# Patient Record
Sex: Male | Born: 1935 | Race: White | Hispanic: No | Marital: Married | Smoking: Former smoker
Health system: Southern US, Community
[De-identification: ages and names within clinical notes are randomized; demographics above are authoritative.]

## PROBLEM LIST (undated history)

## (undated) DIAGNOSIS — Z950 Presence of cardiac pacemaker: Secondary | ICD-10-CM

## (undated) DIAGNOSIS — Z8719 Personal history of other diseases of the digestive system: Secondary | ICD-10-CM

## (undated) DIAGNOSIS — D6481 Anemia due to antineoplastic chemotherapy: Secondary | ICD-10-CM

## (undated) DIAGNOSIS — T7840XA Allergy, unspecified, initial encounter: Secondary | ICD-10-CM

## (undated) DIAGNOSIS — I209 Angina pectoris, unspecified: Secondary | ICD-10-CM

## (undated) DIAGNOSIS — R0602 Shortness of breath: Secondary | ICD-10-CM

## (undated) DIAGNOSIS — I119 Hypertensive heart disease without heart failure: Secondary | ICD-10-CM

## (undated) DIAGNOSIS — T451X5A Adverse effect of antineoplastic and immunosuppressive drugs, initial encounter: Secondary | ICD-10-CM

## (undated) DIAGNOSIS — Z923 Personal history of irradiation: Secondary | ICD-10-CM

## (undated) DIAGNOSIS — I4891 Unspecified atrial fibrillation: Principal | ICD-10-CM

## (undated) DIAGNOSIS — M069 Rheumatoid arthritis, unspecified: Secondary | ICD-10-CM

## (undated) DIAGNOSIS — Z95810 Presence of automatic (implantable) cardiac defibrillator: Secondary | ICD-10-CM

## (undated) DIAGNOSIS — R5081 Fever presenting with conditions classified elsewhere: Secondary | ICD-10-CM

## (undated) DIAGNOSIS — C4490 Unspecified malignant neoplasm of skin, unspecified: Secondary | ICD-10-CM

## (undated) DIAGNOSIS — I219 Acute myocardial infarction, unspecified: Secondary | ICD-10-CM

## (undated) DIAGNOSIS — I255 Ischemic cardiomyopathy: Secondary | ICD-10-CM

## (undated) DIAGNOSIS — I6529 Occlusion and stenosis of unspecified carotid artery: Secondary | ICD-10-CM

## (undated) DIAGNOSIS — I2510 Atherosclerotic heart disease of native coronary artery without angina pectoris: Secondary | ICD-10-CM

## (undated) DIAGNOSIS — M1990 Unspecified osteoarthritis, unspecified site: Secondary | ICD-10-CM

## (undated) DIAGNOSIS — I509 Heart failure, unspecified: Secondary | ICD-10-CM

## (undated) DIAGNOSIS — E1159 Type 2 diabetes mellitus with other circulatory complications: Secondary | ICD-10-CM

## (undated) DIAGNOSIS — D709 Neutropenia, unspecified: Secondary | ICD-10-CM

## (undated) DIAGNOSIS — E785 Hyperlipidemia, unspecified: Secondary | ICD-10-CM

## (undated) DIAGNOSIS — C3490 Malignant neoplasm of unspecified part of unspecified bronchus or lung: Secondary | ICD-10-CM

## (undated) HISTORY — DX: Occlusion and stenosis of unspecified carotid artery: I65.29

## (undated) HISTORY — DX: Rheumatoid arthritis, unspecified: M06.9

## (undated) HISTORY — DX: Atherosclerotic heart disease of native coronary artery without angina pectoris: I25.10

## (undated) HISTORY — DX: Adverse effect of antineoplastic and immunosuppressive drugs, initial encounter: T45.1X5A

## (undated) HISTORY — PX: CORONARY ANGIOPLASTY WITH STENT PLACEMENT: SHX49

## (undated) HISTORY — DX: Heart failure, unspecified: I50.9

## (undated) HISTORY — PX: PR VEIN BYPASS GRAFT,AORTO-FEM-POP: 35551

## (undated) HISTORY — PX: OTHER SURGICAL HISTORY: SHX169

## (undated) HISTORY — DX: Anemia due to antineoplastic chemotherapy: D64.81

## (undated) HISTORY — DX: Presence of cardiac pacemaker: Z95.0

## (undated) SURGERY — BRONCHOSCOPY, WITH FLUOROSCOPY
Anesthesia: Moderate Sedation

## (undated) SURGERY — Surgical Case
Anesthesia: *Unknown

---

## 1968-10-03 HISTORY — PX: INGUINAL HERNIA REPAIR: SUR1180

## 1968-10-03 HISTORY — PX: HEMORRHOID SURGERY: SHX153

## 1991-08-03 HISTORY — PX: CORONARY ARTERY BYPASS GRAFT: SHX141

## 1998-06-05 ENCOUNTER — Inpatient Hospital Stay (HOSPITAL_COMMUNITY): Admission: AD | Admit: 1998-06-05 | Discharge: 1998-06-08 | Payer: Self-pay | Admitting: Cardiology

## 1998-06-25 ENCOUNTER — Encounter (HOSPITAL_COMMUNITY): Admission: RE | Admit: 1998-06-25 | Discharge: 1998-09-23 | Payer: Self-pay | Admitting: Cardiology

## 1998-09-24 ENCOUNTER — Encounter (HOSPITAL_COMMUNITY): Admission: RE | Admit: 1998-09-24 | Discharge: 1998-12-23 | Payer: Self-pay | Admitting: Cardiology

## 1999-07-02 ENCOUNTER — Encounter: Payer: Self-pay | Admitting: *Deleted

## 1999-07-02 ENCOUNTER — Encounter: Admission: RE | Admit: 1999-07-02 | Discharge: 1999-07-02 | Payer: Self-pay | Admitting: *Deleted

## 2000-09-28 ENCOUNTER — Ambulatory Visit (HOSPITAL_COMMUNITY): Admission: RE | Admit: 2000-09-28 | Discharge: 2000-09-28 | Payer: Self-pay | Admitting: Gastroenterology

## 2001-06-01 ENCOUNTER — Encounter: Payer: Self-pay | Admitting: Internal Medicine

## 2001-06-01 ENCOUNTER — Encounter: Admission: RE | Admit: 2001-06-01 | Discharge: 2001-06-01 | Payer: Self-pay | Admitting: Internal Medicine

## 2001-06-20 ENCOUNTER — Ambulatory Visit (HOSPITAL_COMMUNITY): Admission: RE | Admit: 2001-06-20 | Discharge: 2001-06-20 | Payer: Self-pay | Admitting: Cardiology

## 2002-05-03 ENCOUNTER — Encounter: Payer: Self-pay | Admitting: Internal Medicine

## 2002-05-03 ENCOUNTER — Encounter: Admission: RE | Admit: 2002-05-03 | Discharge: 2002-05-03 | Payer: Self-pay | Admitting: Internal Medicine

## 2002-12-06 ENCOUNTER — Encounter (INDEPENDENT_AMBULATORY_CARE_PROVIDER_SITE_OTHER): Payer: Self-pay | Admitting: Cardiology

## 2002-12-06 ENCOUNTER — Ambulatory Visit: Admission: RE | Admit: 2002-12-06 | Discharge: 2002-12-06 | Payer: Self-pay | Admitting: Internal Medicine

## 2003-01-19 ENCOUNTER — Encounter: Admission: RE | Admit: 2003-01-19 | Discharge: 2003-01-19 | Payer: Self-pay | Admitting: Internal Medicine

## 2003-11-08 ENCOUNTER — Encounter: Admission: RE | Admit: 2003-11-08 | Discharge: 2003-11-08 | Payer: Self-pay | Admitting: Internal Medicine

## 2004-04-01 ENCOUNTER — Ambulatory Visit (HOSPITAL_COMMUNITY): Admission: RE | Admit: 2004-04-01 | Discharge: 2004-04-01 | Payer: Self-pay | Admitting: Gastroenterology

## 2004-11-07 ENCOUNTER — Encounter: Admission: RE | Admit: 2004-11-07 | Discharge: 2004-11-07 | Payer: Self-pay | Admitting: Cardiology

## 2004-11-10 ENCOUNTER — Inpatient Hospital Stay (HOSPITAL_COMMUNITY): Admission: RE | Admit: 2004-11-10 | Discharge: 2004-11-12 | Payer: Self-pay | Admitting: Cardiology

## 2004-11-11 ENCOUNTER — Ambulatory Visit: Payer: Self-pay | Admitting: Internal Medicine

## 2004-11-27 ENCOUNTER — Encounter (HOSPITAL_COMMUNITY): Admission: RE | Admit: 2004-11-27 | Discharge: 2005-02-25 | Payer: Self-pay | Admitting: Cardiology

## 2005-02-26 ENCOUNTER — Encounter (HOSPITAL_COMMUNITY): Admission: RE | Admit: 2005-02-26 | Discharge: 2005-05-27 | Payer: Self-pay | Admitting: Cardiology

## 2005-09-15 ENCOUNTER — Encounter: Admission: RE | Admit: 2005-09-15 | Discharge: 2005-09-15 | Payer: Self-pay | Admitting: Internal Medicine

## 2006-05-04 HISTORY — PX: INSERT / REPLACE / REMOVE PACEMAKER: SUR710

## 2006-05-04 HISTORY — PX: CARDIAC DEFIBRILLATOR PLACEMENT: SHX171

## 2006-05-06 ENCOUNTER — Ambulatory Visit: Payer: Self-pay | Admitting: Internal Medicine

## 2006-05-06 LAB — CONVERTED CEMR LAB
BUN: 79 mg/dL — ABNORMAL HIGH (ref 6–23)
Basophils Absolute: 0.1 10*3/uL (ref 0.0–0.1)
Basophils Relative: 0.9 % (ref 0.0–1.0)
CO2: 28 meq/L (ref 19–32)
Calcium: 9.4 mg/dL (ref 8.4–10.5)
Chloride: 104 meq/L (ref 96–112)
Creatinine, Ser: 2.8 mg/dL — ABNORMAL HIGH (ref 0.4–1.5)
Eosinophils Absolute: 0.4 10*3/uL (ref 0.0–0.6)
Eosinophils Relative: 4.9 % (ref 0.0–5.0)
GFR calc Af Amer: 29 mL/min
GFR calc non Af Amer: 24 mL/min
Glucose, Bld: 188 mg/dL — ABNORMAL HIGH (ref 70–99)
HCT: 38.1 % — ABNORMAL LOW (ref 39.0–52.0)
Hemoglobin: 13.2 g/dL (ref 13.0–17.0)
INR: 1.2 (ref 0.9–2.0)
Lymphocytes Relative: 36.5 % (ref 12.0–46.0)
MCHC: 34.7 g/dL (ref 30.0–36.0)
MCV: 92 fL (ref 78.0–100.0)
Monocytes Absolute: 0.8 10*3/uL — ABNORMAL HIGH (ref 0.2–0.7)
Monocytes Relative: 10.6 % (ref 3.0–11.0)
Neutro Abs: 3.7 10*3/uL (ref 1.4–7.7)
Neutrophils Relative %: 47.1 % (ref 43.0–77.0)
Platelets: 246 10*3/uL (ref 150–400)
Potassium: 5.2 meq/L — ABNORMAL HIGH (ref 3.5–5.1)
Prothrombin Time: 13.2 s (ref 10.0–14.0)
RBC: 4.14 M/uL — ABNORMAL LOW (ref 4.22–5.81)
RDW: 13.2 % (ref 11.5–14.6)
Sodium: 141 meq/L (ref 135–145)
WBC: 7.9 10*3/uL (ref 4.5–10.5)
aPTT: 28.5 s (ref 26.5–36.5)

## 2006-05-10 ENCOUNTER — Ambulatory Visit: Payer: Self-pay | Admitting: Internal Medicine

## 2006-05-10 LAB — CONVERTED CEMR LAB
BUN: 78 mg/dL — ABNORMAL HIGH (ref 6–23)
CO2: 27 meq/L (ref 19–32)
Calcium: 9.4 mg/dL (ref 8.4–10.5)
Chloride: 102 meq/L (ref 96–112)
Creatinine, Ser: 2.6 mg/dL — ABNORMAL HIGH (ref 0.4–1.5)
GFR calc Af Amer: 32 mL/min
GFR calc non Af Amer: 26 mL/min
Glucose, Bld: 124 mg/dL — ABNORMAL HIGH (ref 70–99)
Potassium: 4.9 meq/L (ref 3.5–5.1)
Sodium: 140 meq/L (ref 135–145)

## 2006-05-14 ENCOUNTER — Ambulatory Visit: Payer: Self-pay | Admitting: Internal Medicine

## 2006-05-14 ENCOUNTER — Inpatient Hospital Stay (HOSPITAL_COMMUNITY): Admission: RE | Admit: 2006-05-14 | Discharge: 2006-05-15 | Payer: Self-pay | Admitting: Internal Medicine

## 2006-05-26 ENCOUNTER — Ambulatory Visit: Payer: Self-pay

## 2006-09-15 ENCOUNTER — Ambulatory Visit: Payer: Self-pay | Admitting: Internal Medicine

## 2006-10-25 ENCOUNTER — Ambulatory Visit (HOSPITAL_COMMUNITY): Admission: RE | Admit: 2006-10-25 | Discharge: 2006-10-25 | Payer: Self-pay | Admitting: Cardiology

## 2007-02-03 HISTORY — PX: CAROTID ENDARTERECTOMY: SUR193

## 2007-03-01 ENCOUNTER — Ambulatory Visit: Payer: Self-pay | Admitting: Vascular Surgery

## 2007-03-04 ENCOUNTER — Inpatient Hospital Stay (HOSPITAL_COMMUNITY): Admission: RE | Admit: 2007-03-04 | Discharge: 2007-03-05 | Payer: Self-pay | Admitting: Vascular Surgery

## 2007-03-04 ENCOUNTER — Encounter: Payer: Self-pay | Admitting: Vascular Surgery

## 2007-03-04 ENCOUNTER — Ambulatory Visit: Payer: Self-pay | Admitting: Vascular Surgery

## 2007-03-15 ENCOUNTER — Ambulatory Visit: Payer: Self-pay | Admitting: Vascular Surgery

## 2007-08-02 ENCOUNTER — Encounter: Admission: RE | Admit: 2007-08-02 | Discharge: 2007-08-02 | Payer: Self-pay | Admitting: Internal Medicine

## 2007-09-13 ENCOUNTER — Ambulatory Visit: Payer: Self-pay | Admitting: Vascular Surgery

## 2008-03-20 ENCOUNTER — Ambulatory Visit: Payer: Self-pay | Admitting: Vascular Surgery

## 2008-11-18 ENCOUNTER — Encounter: Payer: Self-pay | Admitting: Internal Medicine

## 2008-12-11 DIAGNOSIS — E1159 Type 2 diabetes mellitus with other circulatory complications: Secondary | ICD-10-CM

## 2008-12-11 DIAGNOSIS — I255 Ischemic cardiomyopathy: Secondary | ICD-10-CM

## 2008-12-11 HISTORY — DX: Type 2 diabetes mellitus with other circulatory complications: E11.59

## 2008-12-13 ENCOUNTER — Ambulatory Visit: Payer: Self-pay | Admitting: Internal Medicine

## 2008-12-13 DIAGNOSIS — I4891 Unspecified atrial fibrillation: Secondary | ICD-10-CM

## 2008-12-13 DIAGNOSIS — Z95810 Presence of automatic (implantable) cardiac defibrillator: Secondary | ICD-10-CM

## 2009-03-05 ENCOUNTER — Ambulatory Visit: Payer: Self-pay | Admitting: Vascular Surgery

## 2009-09-30 ENCOUNTER — Ambulatory Visit: Payer: Self-pay | Admitting: Internal Medicine

## 2009-10-01 ENCOUNTER — Telehealth: Payer: Self-pay | Admitting: Internal Medicine

## 2009-10-01 ENCOUNTER — Encounter: Payer: Self-pay | Admitting: Internal Medicine

## 2009-11-11 ENCOUNTER — Ambulatory Visit: Payer: Self-pay | Admitting: Internal Medicine

## 2010-03-04 NOTE — Assessment & Plan Note (Signed)
Summary: f74m/dfg   Visit Type:  f51m Primary Provider:  Dr. Donnie Aho  CC:  sharp pains in his chest.  History of Present Illness: Karl [name] returns  today for followup.  He was seen in our office several weeks ago for evaluation of atrial fibrillation with a RVR resulting in an ICD shock.  He had his device reprogrammed and has been anxious and reports that he has curtailed many of his usual activities.  He also notes that he has had fleeting episodes of chest pain.  Non-exertional lasting only a secondd or two.  No other sequelae.  he notes that when he lays down at night he feels like his heart is beating hard.  No other complaints.  He denies sob or peripheral edema.  Problems Prior to Update: 1)  Atrial Fibrillation  (ICD-427.31) 2)  Automatic Implantable Cardiac Defibrillator Situ  (ICD-V45.02) 3)  Cardiomyopathy, Ischemic  (ICD-414.8) 4)  Hyperlipidemia  (ICD-272.4) 5)  Hypertension  (ICD-401.9) 6)  Diabetes Mellitus, Type II  (ICD-250.00) 7)  Chronic Systolic Heart Failure  (ICD-428.22) 8)  Coronary Artery Disease  (ICD-414.00) 9)  Hypotension  (ICD-458.9) 10)  Carotid Endarterectomy, Left, Hx of  (ICD-V15.1) 11)  Carotid Artery Stenosis, Left  (ICD-433.10)  Current Medications (verified): 1)  Aspirin 81 Mg Tbec (Aspirin) .... Take One Tablet By Mouth Daily 2)  Carvedilol 3.125 Mg Tabs (Carvedilol) .... Take One Tablet By Mouth Twice A Day 3)  Diovan 160 Mg Tabs (Valsartan) .... Take One Tablet By Mouth Daily 4)  Furosemide 40 Mg Tabs (Furosemide) .... Take One Tablet By Mouth Daily. 5)  Pradaxa 150 Mg Caps (Dabigatran Etexilate Mesylate) .... Take 1 Capsule Twice A Day 6)  Gemfibrozil 600 Mg Tabs (Gemfibrozil) .... Take One Tablet By Mouth Twice A Day With Meals 7)  Fish Oil   Oil (Fish Oil) .... 1000mg  Two Times A Day 8)  Fosinopril Sodium 20 Mg Tabs (Fosinopril Sodium) .... Once Daily 9)  Glimepiride 1 Mg Tabs (Glimepiride) .... 1/2 Tablet Once Daily 10)  Digoxin 0.125 Mg  Tabs (Digoxin) .... Take One Tablet By Mouth Daily 11)  Simvastatin 40 Mg Tabs (Simvastatin) .... Take One Tablet By Mouth Daily At Bedtime 12)  Tramadol Hcl 50 Mg Tabs (Tramadol Hcl) .... Once Daily  Allergies (verified): 1)  ! Avandamet (Rosiglitazone-Metformin)  Past History:  Past Medical History: Last updated: 12/11/2008 Diabetes Type 2 Hypertension Hyperlipidemia.History of pacemaker and implantable defibrillator in April of 2008 History of ischemic cardiomyopathy  History of Class II to III chronic systolic congestive heart failure.  Coronary artery disease status post coronary artery bypass grafting    by Dr. Andrey Campanile in 1993 and percutaneous transluminal coronary    angioplasty and stenting by Dr. Donnie Aho in 2002 and in 2006.   Severe left internal carotid artery stenosis, asymptomatic.   Status post left carotid endarterectomy.   Past Surgical History: Last updated: 12/11/2008 .History of pacemaker and implantable defibrillator in April of 2008  St.Jude#[id number] March 04, 2007, left carotid endarterectomy with Dacron   patch angioplasty by Dr. Josephina Gip.   Review of Systems       The patient complains of chest pain.  The patient denies syncope, dyspnea on exertion, and peripheral edema.    Vital Signs:  Patient profile:   75 year old male Height:      71 inches Weight:      208.50 pounds BMI:     29.18 Pulse rate:   62 / minute BP sitting:  128 / 62  (left arm) Cuff size:   large  Vitals Entered By: Caralee Ates CMA (November 11, 2009 9:07 AM)  Physical Exam  General:  Well developed, well nourished, in no acute distress. Head:  normocephalic and atraumatic Eyes:  PERRLA/EOM intact; conjunctiva and lids normal. Mouth:  Teeth, gums and palate normal. Oral mucosa normal. Neck:  Neck supple, no JVD. No masses, thyromegaly or abnormal cervical nodes. Chest Wall:  Well healed ICD incision. Lungs:  Clear bilaterally to auscultation.  No wheezes, rales, or  rhonchi or increased work of breathing. Heart:  RRR with normal S1 and S2.  PMI is enlarged and laterally displaced.  No murmurs. Abdomen:  Bowel sounds positive; abdomen soft and non-tender without masses, organomegaly, or hernias noted. No hepatosplenomegaly. Msk:  Back normal, normal gait. Muscle strength and tone normal. Pulses:  pulses normal in all 4 extremities Extremities:  No clubbing or cyanosis. Neurologic:  Alert and oriented x 3.    ICD Specifications Following MD:  Karl Bunting, MD     Referring MD:  Link Snuffer ICD Vendor:  [facility]     ICD Model Number:  [id number]     ICD Serial Number:  [id number] ICD DOI:  05/14/2006     ICD Implanting MD:  Karl Bunting, MD  Lead 1:    Location: RV     DOI: 05/14/2006     Model #: [id number]     Serial #: [id number]     Status: active  ICD Follow Up Remote Check?  No Battery Voltage:  3.10 V     Charge Time:  10.4 seconds     Battery Est. Longevity:  5.6 YEARS Underlying rhythm:  SR ICD Dependent:  No       ICD Device Measurements Right Ventricle:  Amplitude: 11.7 mV, Impedance: 440 ohms,  Shock Impedance: 39 ohms   Episodes Shock:  0     ATP:  0     Nonsustained:  0     Ventricular Pacing:  <1%  Brady Parameters Mode VVI     Lower Rate Limit:  40      Tachy Zones VF:  206     VT:  166(monitor)     Tech Comments:  Interrogation only today.  Merlin transmissions with Dr. Donnie Aho. Altha Harm, LPN  November 11, 2009 9:37 AM   Impression & Recommendations:  Problem # 1:  AUTOMATIC IMPLANTABLE CARDIAC DEFIBRILLATOR SITU (ICD-V45.02) His device is rechecked today to see if he has had any ventricular high rates.  He has not.  His device will be rechecked in January.  Problem # 2:  ATRIAL FIBRILLATION (ICD-427.31) No evidence of recurrent atrial fibrillation. He will continue Pradaxa. His updated medication list for this problem includes:    Aspirin 81 Mg Tbec (Aspirin) .Marland Kitchen... Take one tablet by mouth daily    Carvedilol 3.125 Mg Tabs  (Carvedilol) .Marland Kitchen... Take one tablet by mouth twice a day    Digoxin 0.125 Mg Tabs (Digoxin) .Marland Kitchen... Take one tablet by mouth daily  Problem # 3:  CHRONIC SYSTOLIC HEART FAILURE (ICD-428.22) He is still class 2.  He will continue his current meds and maintain a low sodium diet. His updated medication list for this problem includes:    Aspirin 81 Mg Tbec (Aspirin) .Marland Kitchen... Take one tablet by mouth daily    Carvedilol 3.125 Mg Tabs (Carvedilol) .Marland Kitchen... Take one tablet by mouth twice a day    Diovan 160 Mg Tabs (Valsartan) .Marland Kitchen... Take one  tablet by mouth daily    Furosemide 40 Mg Tabs (Furosemide) .Marland Kitchen... Take one tablet by mouth daily.    Fosinopril Sodium 20 Mg Tabs (Fosinopril sodium) ..... Once daily    Digoxin 0.125 Mg Tabs (Digoxin) .Marland Kitchen... Take one tablet by mouth daily  His updated medication list for this problem includes:    Aspirin 81 Mg Tbec (Aspirin) .Marland Kitchen... Take one tablet by mouth daily    Carvedilol 3.125 Mg Tabs (Carvedilol) .Marland Kitchen... Take one tablet by mouth twice a day    Diovan 160 Mg Tabs (Valsartan) .Marland Kitchen... Take one tablet by mouth daily    Furosemide 40 Mg Tabs (Furosemide) .Marland Kitchen... Take one tablet by mouth daily.    Fosinopril Sodium 20 Mg Tabs (Fosinopril sodium) ..... Once daily    Digoxin 0.125 Mg Tabs (Digoxin) .Marland Kitchen... Take one tablet by mouth daily  His updated medication list for this problem includes:    Aspirin 81 Mg Tbec (Aspirin) .Marland Kitchen... Take one tablet by mouth daily    Carvedilol 3.125 Mg Tabs (Carvedilol) .Marland Kitchen... Take one tablet by mouth twice a day    Diovan 160 Mg Tabs (Valsartan) .Marland Kitchen... Take one tablet by mouth daily    Furosemide 40 Mg Tabs (Furosemide) .Marland Kitchen... Take one tablet by mouth daily.    Fosinopril Sodium 20 Mg Tabs (Fosinopril sodium) ..... Once daily    Digoxin 0.125 Mg Tabs (Digoxin) .Marland Kitchen... Take one tablet by mouth daily  Problem # 4:  CHEST PAIN (ICD-786.50) His symptoms are non- cardiac.  I have asked him to followup with Dr. Donnie Aho if they progress. His updated  medication list for this problem includes:    Aspirin 81 Mg Tbec (Aspirin) .Marland Kitchen... Take one tablet by mouth daily    Carvedilol 3.125 Mg Tabs (Carvedilol) .Marland Kitchen... Take one tablet by mouth twice a day    Fosinopril Sodium 20 Mg Tabs (Fosinopril sodium) ..... Once daily  Patient Instructions: 1)  Your physician recommends that you schedule a follow-up appointment in: as scheduled

## 2010-03-04 NOTE — Procedures (Signed)
Summary: [facility]   Allergies: 1)  ! Avandamet (Rosiglitazone-Metformin)   ICD Follow Up Remote Check?  No Battery Voltage:  3.13 V     Charge Time:  10.4 seconds     Battery Est. Longevity:  5.7 years ICD Dependent:  No       ICD Device Measurements Right Ventricle:  Impedance: 410 ohms,   Brady Parameters Mode VVI     Lower Rate Limit:  40      Tachy Zones VF:  206     VT:  166(monitor)     Tech Comments:  Programming changes as above per Dr. Ladona Ridgel for prevention of inappropriate therapy. Altha Harm, LPN  October 01, 2009 7:34 AM

## 2010-03-04 NOTE — Progress Notes (Signed)
Summary: PT NEEDS PRIOR APPROVAL FOR MEDICATION  Phone Note Call from Patient Call back at Home Phone [phone]   Caller: Patient Reason for Call: Talk to Nurse, Talk to Doctor Summary of Call: PT WAS PUT ON PRADAXA AND CANT GET IT FILLED UNTIL WE GET THE ARROVAL SENT IN TO HIS INSURANCE  PLS CALL [employer] [phone] MEMBER ID # [id number] PLEASE CALL PT AND LET HIM KNOW WHAT HAPPENS AND WHEN HE CAN GET HIS MEDICATIONS Initial call taken by: Omer Jack,  October 01, 2009 10:25 AM  Follow-up for Phone Call        approved for 1 year. Follow-up by: Laurance Flatten CMA,  October 01, 2009 3:16 PM

## 2010-03-04 NOTE — Assessment & Plan Note (Signed)
Summary: rov/dfg   Visit Type:  rov Primary Provider:  Dr. Donnie Aho  CC:  difb. reading.  History of Present Illness: Karl [name] is referred today for followup by Dr. Donnie Aho.  He was seen in his office today for evaluation of atrial fibrillation with a RVR resulting in an ICD shock.  The patient has had a questionable h/o atrial fib in the past.  He had some irregular tachycardia with ATP almost a year ago.  He was asymptomatic.  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  He did not feel palpitations or pre-syncope prior to the episode.  He was only shocked once.  No other complaints.  He denies c/p, sob or peripheral edema.  Current Medications (verified): 1)  Aspirin 81 Mg Tbec (Aspirin) .... Take One Tablet By Mouth Daily 2)  Carvedilol 3.125 Mg Tabs (Carvedilol) .... Take One Tablet By Mouth Twice A Day 3)  Diovan 160 Mg Tabs (Valsartan) .... Take One Tablet By Mouth Daily 4)  Furosemide 40 Mg Tabs (Furosemide) .... Take One Tablet By Mouth Daily. 5)  Plavix 75 Mg Tabs (Clopidogrel Bisulfate) .... Take One Tablet By Mouth Daily 6)  Gemfibrozil 600 Mg Tabs (Gemfibrozil) .... Take One Tablet By Mouth Twice A Day With Meals 7)  Fish Oil   Oil (Fish Oil) .... 1000mg  Two Times A Day 8)  Fosinopril Sodium 20 Mg Tabs (Fosinopril Sodium) .... Once Daily 9)  Glimepiride 1 Mg Tabs (Glimepiride) .... 1/2 Tablet Once Daily  Allergies (verified): 1)  ! Avandamet (Rosiglitazone-Metformin)  Past History:  Past Medical History: Last updated: 12/11/2008 Diabetes Type 2 Hypertension Hyperlipidemia.History of pacemaker and implantable defibrillator in April of 2008 History of ischemic cardiomyopathy  History of Class II to III chronic systolic congestive heart failure.  Coronary artery disease status post coronary artery bypass grafting    by Dr. Andrey Campanile in 1993 and percutaneous transluminal coronary    angioplasty and stenting by Dr. Donnie Aho in 2002 and in 2006.   Severe left internal  carotid artery stenosis, asymptomatic.   Status post left carotid endarterectomy.   Past Surgical History: Last updated: 12/11/2008 .History of pacemaker and implantable defibrillator in April of 2008  St.Jude#[id number] March 04, 2007, left carotid endarterectomy with Dacron   patch angioplasty by Dr. Josephina Gip.   Review of Systems  The patient denies chest pain, syncope, dyspnea on exertion, and peripheral edema.    Vital Signs:  Patient profile:   75 year old male Height:      71 inches Weight:      204.75 pounds BMI:     28.66 Pulse rate:   68 / minute BP sitting:   132 / 80  (left arm) Cuff size:   regular  Physical Exam  General:  Well developed, well nourished, in no acute distress. Head:  normocephalic and atraumatic Eyes:  PERRLA/EOM intact; conjunctiva and lids normal. Mouth:  Teeth, gums and palate normal. Oral mucosa normal. Neck:  Neck supple, no JVD. No masses, thyromegaly or abnormal cervical nodes. Chest Wall:  Well healed ICD incision. Lungs:  Clear bilaterally to auscultation.  No wheezes, rales, or rhonchi or increased work of breathing. Heart:  RRR with normal S1 and S2.  PMI is enlarged and laterally displaced.  No murmurs. Abdomen:  Bowel sounds positive; abdomen soft and non-tender without masses, organomegaly, or hernias noted. No hepatosplenomegaly. Msk:  Back normal, normal gait. Muscle strength and tone normal. Pulses:  pulses normal in all 4 extremities  Extremities:  No clubbing or cyanosis. Neurologic:  Alert and oriented x 3.   MD Comments:  ICD interogation demonstrates atrial fib with an RVR with ICD shock.  Impression & Recommendations:  Problem # 1:  AUTOMATIC IMPLANTABLE CARDIAC DEFIBRILLATOR SITU (ICD-V45.02) His device is working normally.  He appears to have an ICD shock secondary to atrial fib.  His device has been reprogrammed to minimize shocks for atrial fibrillation.  Problem # 2:  ATRIAL FIBRILLATION (ICD-427.31) The  patient at this point should be considered to have atrial fibrillation and I would recommend the initiation of anticoagulation.  I discussed the treatment options and have recommended pradaxa. I have asked him to stop Plavix and continue his low dose ASA. His updated medication list for this problem includes:    Aspirin 81 Mg Tbec (Aspirin) .Marland Kitchen... Take one tablet by mouth daily    Carvedilol 3.125 Mg Tabs (Carvedilol) .Marland Kitchen... Take one tablet by mouth twice a day    Digoxin 0.125 Mg Tabs (Digoxin) .Marland Kitchen... Take one tablet by mouth daily  Problem # 3:  CARDIOMYOPATHY, ISCHEMIC (ICD-414.8) He denies any anginal symptoms.  Additional evaluation per Dr. Donnie Aho. His updated medication list for this problem includes:    Aspirin 81 Mg Tbec (Aspirin) .Marland Kitchen... Take one tablet by mouth daily    Carvedilol 3.125 Mg Tabs (Carvedilol) .Marland Kitchen... Take one tablet by mouth twice a day    Diovan 160 Mg Tabs (Valsartan) .Marland Kitchen... Take one tablet by mouth daily    Furosemide 40 Mg Tabs (Furosemide) .Marland Kitchen... Take one tablet by mouth daily.    Fosinopril Sodium 20 Mg Tabs (Fosinopril sodium) ..... Once daily    Digoxin 0.125 Mg Tabs (Digoxin) .Marland Kitchen... Take one tablet by mouth daily  Patient Instructions: 1)  Your physician recommends that you schedule a follow-up appointment in: 2 months with Dr. Ladona Ridgel  October 10,11 at 8:45am 2)  Your physician has recommended you make the following change in your medication: STOP Plavix  START  Pradaxa & Digoxin Prescriptions: DIGOXIN 0.125 MG TABS (DIGOXIN) Take one tablet by mouth daily  #30 x 11   Entered by:   Karl Devoid RN   Authorized by:   Laren Boom, MD, [facility]   Signed by:   Karl Devoid RN on 09/30/2009   Method used:   Electronically to        [facility]  [address] [id number] (retail[facility]       [address]       [place], [place]  [address]       Ph: [phone] or [phone]       Fax: [phone]   RxID:   [id number] PRADAXA 150 MG CAPS (DABIGATRAN ETEXILATE MESYLATE) Take 1  capsule twice a day  #60 x 11   Entered by:   Karl Devoid RN   Authorized by:   Laren Boom, MD, [facility]   Signed by:   Karl Devoid RN on 09/30/2009   Method used:   [facility] to        [facility]  [address] (retail[facility]       [address]       [place], [place]  [address]       Ph: [phone] or [phone]       Fax: [phone]   RxID:   [id number]

## 2010-03-04 NOTE — Cardiovascular Report (Signed)
Summary: Office Visit   Office Visit   Imported By: Roderic Ovens 11/14/2009 15:17:32  _____________________________________________________________________  External Attachment:    Type:   Image     Comment:   External Document

## 2010-03-04 NOTE — Letter (Signed)
Summary: Dr[facility] Viann Fish Jr[facility]  Dr[facility] Viann Fish Jr[facility]s Office   Imported By: Marylou Mccoy 10/16/2009 16:48:59  _____________________________________________________________________  External Attachment:    Type:   Image     Comment:   External Document

## 2010-03-18 ENCOUNTER — Encounter: Payer: Self-pay | Admitting: Internal Medicine

## 2010-04-07 ENCOUNTER — Other Ambulatory Visit: Payer: Self-pay

## 2010-04-10 NOTE — Letter (Signed)
Summary: [facility]   Imported By: Marylou Mccoy 04/03/2010 14:15:28  _____________________________________________________________________  External Attachment:    Type:   Image     Comment:   External Document

## 2010-04-15 ENCOUNTER — Other Ambulatory Visit (INDEPENDENT_AMBULATORY_CARE_PROVIDER_SITE_OTHER): Payer: Medicare Other

## 2010-04-15 DIAGNOSIS — Z48812 Encounter for surgical aftercare following surgery on the circulatory system: Secondary | ICD-10-CM

## 2010-04-15 DIAGNOSIS — I6529 Occlusion and stenosis of unspecified carotid artery: Secondary | ICD-10-CM

## 2010-04-21 NOTE — Procedures (Unsigned)
CAROTID DUPLEX EXAM  INDICATION:  Follow up left carotid endarterectomy.  HISTORY: Diabetes:  yes Cardiac:  CHF, MI 05/18/2003; CABG, stent. Hypertension:  yes Smoking:  no Previous Surgery:  Left carotid endarterectomy 03/04/2007 by Dr. Hart Rochester. CV History:  Asymptomatic currently Amaurosis Fugax No, Paresthesias No, Hemiparesis No                                      RIGHT             LEFT Brachial systolic pressure:         130               127 Brachial Doppler waveforms:         normal            normal Vertebral direction of flow:        antegrade         antegrade DUPLEX VELOCITIES (cm/sec)                       CCA peak systolic 88    M=73 D=150 ECA peak systolic                   116               178 ICA peak systolic                   75                123 ICA end diastolic                   31                24 PLAQUE MORPHOLOGY:                  mixed             homogeneous PLAQUE AMOUNT:                      mild              mild PLAQUE LOCATION:                    Bifurcation, ICA  Distal CCA  IMPRESSION: 1. Right internal carotid artery velocities suggest 1% to 39%     stenosis. 2. Patent left carotid endarterectomy site with no evidence of     restenosis of the ICA. 3. Left distal common carotid artery stenosis at proximal end of     patch.  ___________________________________________ Quita Skye. Hart Rochester, M.D.  EM/MEDQ  D:  04/16/2010  T:  04/16/2010  Job:  [id number]

## 2010-06-04 ENCOUNTER — Other Ambulatory Visit: Payer: Self-pay | Admitting: Dermatology

## 2010-06-17 ENCOUNTER — Other Ambulatory Visit: Payer: Self-pay | Admitting: Internal Medicine

## 2010-06-17 DIAGNOSIS — R1013 Epigastric pain: Secondary | ICD-10-CM

## 2010-06-17 NOTE — Assessment & Plan Note (Signed)
OFFICE VISIT   Karl [name], Karl [name]  DOB:  November 26, 1935                                       03/15/2007  [id number]#:[id number]   The patient underwent a left carotid endarterectomy by me on January 30  for severe but asymptomatic left internal carotid artery stenosis.  He  has had no neurological complications and has no specific complaints.  He is swallowing well, has no hoarseness, and has had no hemispheric  TIA, amaurosis fugax, diplopia, blurred vision, or syncope.  He has  taken an aspirin per day.   EXAM:  Blood pressure 108/58, heart rate 64, respirations 16.  Carotid  pulses are 3+.  Soft bruit on the right.  Left neck incision is healing  nicely.  Neurological exam is normal.   I think he is getting along nicely and we will see him back in 6 months  for followup carotid duplex exam.  If he has any symptoms in the  interim, he will be in touch with me.   Karl [name], M.D.  Electronically Signed   JDL/MEDQ  D:  03/15/2007  T:  03/17/2007  Job:  [id number]   cc:   Gwen Pounds, MD  Georga Hacking, M.D.

## 2010-06-17 NOTE — Procedures (Signed)
CAROTID DUPLEX EXAM   INDICATION:  Follow up carotid artery disease.   HISTORY:  Diabetes:  yes  Cardiac:  MI, CHF, CABG, stent  Hypertension:  yes  Smoking:  previous  Previous Surgery:  Left CEA 03/04/2007 by Dr. Hart Rochester  CV History:  Asymptomatic.  Amaurosis Fugax No, Paresthesias No, Hemiparesis No                                       RIGHT             LEFT  Brachial systolic pressure:         126               134  Brachial Doppler waveforms:         wnl               wnl  Vertebral direction of flow:        antegrade         antegrade  DUPLEX VELOCITIES (cm/sec)  CCA peak systolic                   113               M=102, D=182  ECA peak systolic                   148               147  ICA peak systolic                   103               132  ICA end diastolic                   30                44  PLAQUE MORPHOLOGY:                  calcified         homogenous  PLAQUE AMOUNT:                      mild              mild  PLAQUE LOCATION:                    ICA/bifurcation   Distal CCA   IMPRESSION:  1. Right internal carotid artery shows evidence of 20% to 39% stenosis      and appears stable.  2. Left internal carotid artery velocities are fairly stable from      previous study and suggestive of low end 40% to 59% stenosis,      however, no internal carotid artery plaque visualized.  3. Left distal common carotid artery stenosis at the proximal end of      patch.   ___________________________________________  Quita Skye. Hart Rochester, M.D.   AS/MEDQ  D:  03/05/2009  T:  03/06/2009  Job:  [id number]

## 2010-06-17 NOTE — Op Note (Signed)
NAMEMADOX, CORKINS               ACCOUNT NO.:  [id number]   MEDICAL RECORD NO.:  [id number]          PATIENT TYPE:  INP   LOCATION:  3313                         FACILITY:  [facility]   PHYSICIAN:  Quita Skye. Hart Rochester, M.D.  DATE OF BIRTH:  October 23, 1935   DATE OF PROCEDURE:  03/04/2007  DATE OF DISCHARGE:                               OPERATIVE REPORT   PREOPERATIVE DIAGNOSIS:  Severe left internal carotid stenosis -  asymptomatic.   POSTOPERATIVE DIAGNOSIS:  Severe left internal carotid stenosis -  asymptomatic.   OPERATION:  Left carotid endarterectomy with Dacron patch angioplasty.   SURGEON:  Quita Skye. Hart Rochester, M.D.   FIRST ASSISTANT:  Jerold Coombe, P.A.   ANESTHESIA:  General endotracheal.   BRIEF HISTORY:  This patient has been followed for an asymptomatic left  internal carotid stenosis for the last few years and his duplex exam in  January revealed a 90% left internal carotid stenosis with minimal flow  reduction on the right side.  He has no history of stroke or neurologic  symptoms, scheduled for an elective left carotid endarterectomy.   PROCEDURE IN DETAIL:  The patient was taken to the operating room,  placed in supine position at which time satisfactory general  endotracheal anesthesia was administered.  The left neck was prepped  with Betadine scrub and solution and draped in a routine sterile manner.  An incision was made along the anterior border of the  sternocleidomastoid muscle and carried down through subcutaneous tissue  and platysma and using the Bovie.  The common facial vein and external  jugular veins were ligated with 3-0 silk ties and divided exposing the  common internal and external carotid arteries.  Care was taken not to  injure the vagus or hypoglossal nerves, both of which were exposed.  There was calcified atherosclerotic plaque at the carotid bifurcation  extending up the internal carotid artery about 3 cm.  Distal vessel  appeared normal.  A  #10 shunt was prepared and the patient was  heparinized.  Carotid vessels were occluded with vascular clamps,  longitudinal opening made in the common carotid with 15 blade, extended  up the internal carotid with the Potts scissors to a point distal to the  disease.  The plaque was about 90% stenotic in severity at the  bifurcation and there was a second plaque extending up posteriorly which  terminated below the hypoglossal nerve.  A #10 shunt was inserted  without difficulty reestablishing flow in about 2 minutes.  A standard  endarterectomy was then performed using the elevator and the Potts  scissors with eversion endarterectomy of the external carotid.  The  plaque feathered off the distal internal carotid artery nicely not  requiring any tacking sutures.  The lumen was thoroughly irrigated with  heparin saline.  All loose debris carefully removed and arteriotomy was  closed with a patch using continuous 6-0 Prolene.  Prior to completion  of the closure the shunt was removed after about 30 minutes shunt time;  following antegrade and retrograde flushing closure was completed with  reestablishment of flow initially up the external  and up the internal  branch.  Carotid was occluded for less than 2  minutes for removal of the shunt.  Jackson-Pratt drain was brought out  through an inferiorly based stab wound and secured with a silk stitch  and the wound closed in layers with Vicryl in subcuticular fashion.  Sterile dressing applied.  The patient was taken to the recovery room in  satisfactory condition.      Quita Skye Hart Rochester, M.D.  Electronically Signed     JDL/MEDQ  D:  03/04/2007  T:  03/04/2007  Job:  [id number]

## 2010-06-17 NOTE — Procedures (Signed)
CAROTID DUPLEX EXAM   INDICATION:  Follow up carotid artery disease.   HISTORY:  Diabetes:  Yes.  Cardiac:  MI in 2000, CABG, CHF.  Hypertension:  Yes.  Smoking:  Quit.  Previous Surgery:  No.  CV History:  No.  Amaurosis Fugax No, Paresthesias No, Hemiparesis No                                       RIGHT             LEFT  Brachial systolic pressure:         140               148  Brachial Doppler waveforms:         Biphasic          Biphasic  Vertebral direction of flow:        Antegrade         Antegrade  DUPLEX VELOCITIES (cm/sec)  CCA peak systolic                   101               84  ECA peak systolic                   128               160  ICA peak systolic                   104               336  ICA end diastolic                   35                114  PLAQUE MORPHOLOGY:                  Calcified         Calcified  PLAQUE AMOUNT:                      Mild/moderate     Severe  PLAQUE LOCATION:                    ICA, bifurcation  ICA, bifurcation   IMPRESSION:  1. The right internal carotid artery shows evidence of 20-39%      stenosis, showing no significant changes from previous study.  2. The left internal carotid artery shows evidence of 80-99% stenosis      (low end of range), showing an increase from previous study.   ___________________________________________  Quita Skye Hart Rochester, M.D.   AS/MEDQ  D:  03/01/2007  T:  03/01/2007  Job:  [id number]   cc:   Georga Hacking, M.D.

## 2010-06-17 NOTE — Assessment & Plan Note (Signed)
[facility]                         ELECTROPHYSIOLOGY OFFICE NOTE   NAME:LEGGETTConroy, Goracke                      MRN:          [id number]  DATE:09/15/2006                            DOB:          1935/10/24    Mr. Fullam returns today for followup.  He is a very pleasant middle-  aged man with a history of ischemic cardiomyopathy, class II congestive  heart failure, who is status post ICD insertion.  He returns today for  followup.  The patient denies chest pain or shortness of breath.  He  does note occasional periods of weakness.  He also notes at night when  he sleeps if he sleeps on his left side that he has some discomfort in  his left shoulder area from his defibrillator.   Medications include:  1. Carvedilol 3.125 twice daily.  2. Diovan 160 daily.  3. Fish oil b.i.d.  4. Fosinopril 20 daily.  5. Furosemide 40 a day.  6. Plavix 75 daily.  7. Vytorin 10/40 daily.  8. Aspirin 325 mg daily.   PHYSICAL EXAMINATION:  He is a pleasant, well-appearing man in no  distress.  Blood pressure today was 102/60, the pulse 76 and regular,  the respirations were 18, the weight was 219 pounds.  NECK:  Revealed no jugular venous distention.  LUNGS:  Clear bilaterally to auscultation.  No wheezes, rales, or  rhonchi were present.  CARDIOVASCULAR:  Revealed a regular rate and rhythm with normal S1 and  S2.  There were no murmurs, rubs, or gallops appreciated.  EXTREMITIES:  Demonstrated no cyanosis, clubbing or edema.  The pulses  were 2+ and symmetric.  His ICD site was healed nicely.   Interrogation of his defibrillator demonstrates a [employer] Jude single-  chamber device with R waves of 11, pacing impedance of 510 ohms,  threshold 0.75 at 0.5.  The battery voltage was greater than 3.2 volts.  Today we turned his outputs in the ventricle down to 2.5 to maximize  battery longevity.   IMPRESSION:  1. Ischemic cardiomyopathy.  2. Congestive heart failure.  3. Status post implantable cardioverter-defibrillator insertion.   DISCUSSION:  Overall, Mr. Blaney is stable and his defibrillator is  working normally.  I will see him back in the office in April 2009.  He  is to call if he needs to be seen sooner or if he receives any  intercurrent ICD discharges.     Doylene Canning. Ladona Ridgel, MD  Electronically Signed    GWT/MedQ  DD: 09/15/2006  DT: 09/16/2006  Job #: [id number]   cc:   Georga Hacking, M.D.  Gwen Pounds, MD

## 2010-06-17 NOTE — Cardiovascular Report (Signed)
[name], Karl [name]               ACCOUNT NO.:  [id number]   MEDICAL RECORD NO.:  [id number]          PATIENT TYPE:  OIB   LOCATION:  2853                         FACILITY:  [facility]   PHYSICIAN:  Georga Hacking, M.D.DATE OF BIRTH:  1935-02-19   DATE OF PROCEDURE:  DATE OF DISCHARGE:                            CARDIAC CATHETERIZATION   HISTORY:  A 75 year old male with previous bypass grafting, who has had  previous stenting of the vein graft to the obtuse marginal 1 and 2, on  several occasions.  He presented with worsening heart failure and  shortness of breath recently.   PROCEDURE:  Left heart catheterization with coronary angiograms, left  ventriculogram, bypass angiograms and internal mammary artery  angiograms.   PROCEDURE:  The procedure was done without complications to the right  femoral artery.  The graft to the circumflex was selected using an  Amplatz left 1 catheter.  The grafts were selected using the standard  right catheter.  At the end of the procedure, he was taken to the  holding area for sheath removal.   HEMODYNAMIC DATA:  Aorta post-contrast 142/66, LV post-contrast 142/10  to 21.   ANGIOGRAPHIC DATA:  Left ventriculogram:  Performed in the 30-degree RAO  projection.  The aortic valve was normal.  The mitral valve was normal.  There is inferior wall hypokinesis.  Anterior wall has some mild  hypokinesis noted to it.  The estimated ejection fraction is 35%.  Coronary arteries arise and distribute normally.  Heavy calcification  noted in the left and right coronary system.  The left main coronary  artery is calcified and is diffusely narrowed.  __________  is narrowed  about 50%.  The left anterior descending is occluded after the septal  perforators.  Some collateral filling is seen in the septal perforator.  Circumflex coronary artery is occluded.  Right coronary artery is  occluded.  Saphenous vein graft to the right coronary artery is  occluded.  The  saphenous vein graft to the first marginal artery and  distal circumflex is patent.  The previous stents in the mid-portion  vessel is widely patent.  Following this is a area of 40% narrowing, and  then the second stent appears patent.  Both the insertion sites are  fine.  Collateral filling is seen over the distal right coronary artery.  The left internal mammary artery sequentially to the LAD and diagonal is  widely patent.   IMPRESSION:  1. Patent long-term stent results of the saphenous vein graft to the      obtuse marginal artery 1 and distal circumflex.  2. Patent internal mammary graft to LAD diagonal.  3. Severe native three-vessel coronary artery disease, with collateral      filling of the distal right coronary artery.  4. Abnormal left ventricular function with estimated EF of 35%.   RECOMMENDATIONS:  Continued medical therapy, control of blood pressure,  weight loss.      Georga Hacking, M.D.  Electronically Signed     WST/MEDQ  D:  10/25/2006  T:  10/25/2006  Job:  [id number]   cc:  [name], MD

## 2010-06-17 NOTE — Procedures (Signed)
CAROTID DUPLEX EXAM   INDICATION:  Followup of known carotid artery disease.  Patient is  currently asymptomatic.   HISTORY:  Diabetes:  Yes.  Cardiac:  MI in 2000, CABG, CHF, patient with defibrillator.  Hypertension:  Yes.  Smoking:  Previous Surgery:  Left CEA with DPA on 03/04/07 by Dr. Hart Rochester.  CV History:  Amaurosis Fugax No, Paresthesias No, Hemiparesis No.                                       RIGHT             LEFT  Brachial systolic pressure:         124               24  Brachial Doppler waveforms:         Triphasic         Triphasic  Vertebral direction of flow:        Antegrade         Antegrade  DUPLEX VELOCITIES (cm/sec)  CCA peak systolic                   89                77  ECA peak systolic                   106               165  ICA peak systolic                   90                104  ICA end diastolic                   29                24  PLAQUE MORPHOLOGY:                  Calcific with shadowing             N/A  PLAQUE AMOUNT:                      Mild-moderate     N/A  PLAQUE LOCATION:                    Bifurcation, ICA  N/A   IMPRESSION:  1. Right 20-39% internal carotid artery stenosis; however, acoustic      shadowing may have obscured higher velocities.  2. Left internal carotid artery without recurrent stenosis, status      post carotid endarterectomy.  3. Mild left external carotid artery stenosis.  4. Bilateral antegrade flow in vertebral arteries.   ___________________________________________  Quita Skye Hart Rochester, M.D.   PB/MEDQ  D:  09/13/2007  T:  09/13/2007  Job:  [id number]

## 2010-06-17 NOTE — H&P (Signed)
HISTORY AND PHYSICAL EXAMINATION   March 01, 2007   Re:  Karl [name], Karl [name].              DOB:  1935-08-27   CHIEF COMPLAINT:  Severe left internal carotid stenosis - asymptomatic.   HISTORY OF PRESENT ILLNESS:  This 75 year old male patient was found to  have carotid occlusive disease after an asymptomatic left carotid bruit  led to duplex scan in 2006.  This revealed approximately 50-60% stenosis  on the left side with no flow reduction on the right.  Subsequent  scanning has revealed progression of disease and in January of 2009 he  now has a 90% left internal carotid stenosis with mild flow reduction on  the right side.  He has no symptoms of hemispheric or nonhemispheric  ischemia including TIAs, amaurosis fugax, diplopia, blurred vision,  syncope, etc. and has no previous history of stroke.  He is to be  admitted for an elective left carotid endarterectomy on January 30.   PAST MEDICAL HISTORY:  1. Coronary artery disease.  2. Myocardial infarction 2002.  3. History of congestive heart failure.  4. Non-insulin-dependent diabetes mellitus.  5. Hypertension.  6. Hyperlipidemia.  7. Negative for stroke.   PAST SURGICAL HISTORY:  1. Coronary artery bypass grafting Dr. Andrey Campanile 1993.  2. PTCA and stenting by Dr. Donnie Aho in 2002 and 2006.  3. Insertion of a pacemaker and implantable defibrillator in April      2008.  4. Also bilateral hernia repair and hemorrhoidectomy.   FAMILY HISTORY:  Positive for coronary artery disease.  Father died at  age 83 of myocardial infarction, negative for diabetes and stroke.   SOCIAL HISTORY:  He is married and is retired as a Merchandiser, retail.  He has  not smoked in 1977.  Drinks occasional alcohol.   ALLERGIES:  None known.   MEDICATIONS:  1. Gemfibrozil 600 mg two a day.  2. Carvedilol 6.25 mg one daily.  3. Aspirin 325 mg one daily.  4. Glimepiride 1 mg one daily.  5. Diovan 160 mg one daily.  6. Furosemide 40 mg one  daily.  7. Fosinopril 20 mg one daily.  8. Plavix 75 mg one daily.  9. Vytorin 10/40 one daily.  10.Fish oil 1000 mg one daily.   PHYSICAL EXAMINATION:  Vital signs:  Blood pressure 140/70, heart rate  60, respirations are 14.  General:  He is a male patient who is in no  apparent distress.  Alert and oriented x3.  Neck:  Supple.  3+ carotid  pulses.  There is harsh bruit on the left side, no bruit on the right.  Neurological:  Normal.  There is no palpable adenopathy in the neck.  Skin:  No skin rash is noted.  Extremities:  Upper extremity pulses are  3+ bilaterally.  Chest:  Clear to auscultation.  Cardiovascular:  Reveals a regular rhythm with no murmurs.  There is evidence of an  implantable defibrillator in the left infraclavicular area on the chest  wall.  Abdomen:  Obese, no palpable masses.  He has 3+ femoral,  popliteal and distal pulses palpable bilaterally.   Carotid duplex exam in our office today on March 01, 2007, reveals 90%  left internal carotid stenosis, mild right internal carotid stenosis.   IMPRESSION:  1. Severe left internal carotid stenosis - asymptomatic.  2. Coronary artery disease, previous myocardial infarction, currently      stable as documented by Dr. Lacretia Nicks. Viann Fish.  3. The  patient underwent cardiac catheterization September 2008 and      has ejection fraction 35% and no current symptoms.  4. Hypertension.  5. Hyperlipidemia.  6. Non-insulin-dependent diabetes mellitus.   PLAN:  Admit the patient on January 30 for an elective left carotid  endarterectomy.  Risks and benefits have been thoroughly discussed and  the patient would like to proceed.   Quita Skye Hart Rochester, M.D.  Electronically Signed   JDL/MEDQ  D:  03/01/2007  T:  03/02/2007  Job:  [id number]   cc:   Georga Hacking, M.D.  Gwen Pounds, MD

## 2010-06-17 NOTE — Procedures (Signed)
CAROTID DUPLEX EXAM   INDICATION:  Follow up known carotid artery disease and left carotid  endarterectomy.   HISTORY:  Diabetes:  Yes.  Cardiac:  MI in 2000 and CHF.  Hypertension:  Yes.  Smoking:  Previous Surgery:  Left carotid endarterectomy on 03/04/07.  CV History:  Amaurosis Fugax No, Paresthesias No, Hemiparesis No.                                       RIGHT             LEFT  Brachial systolic pressure:         122               120  Brachial Doppler waveforms:         Biphasic          Biphasic  Vertebral direction of flow:        Antegrade         Antegrade  DUPLEX VELOCITIES (cm/sec)  CCA peak systolic                   92                82  ECA peak systolic                   144               207  ICA peak systolic                   96                129  ICA end diastolic                   28                29  PLAQUE MORPHOLOGY:                  Heterogenous      Heterogenous  PLAQUE AMOUNT:                      Mild              Mild  PLAQUE LOCATION:                    ICA, ECA          ICA, ECA   IMPRESSION:  1. 20-39% stenosis noted in the bilateral internal carotid artery.  2. Status post left carotid endarterectomy.  3. Antegrade bilateral vertebral arteries.   ___________________________________________  Quita Skye Hart Rochester, M.D.   MG/MEDQ  D:  03/20/2008  T:  03/20/2008  Job:  [id number]

## 2010-06-17 NOTE — Assessment & Plan Note (Signed)
OFFICE VISIT   Karl [name], Karl [name]  DOB:  09/20/1935                                       09/13/2007  ZOXWR#:[id number]   This is an office visit.  The patient underwent left carotid  endarterectomy by me January 30 for severe left internal carotid  stenosis which is asymptomatic.  He has done well since that time with  no neurologic complications or specific complaints.  He denies any  hemispheric or nonhemispheric TIAs, amaurosis fugax, diplopia, blurred  vision or syncope.  He has also had no chest pain, dyspnea on exertion,  PND, orthopnea or claudication symptoms.  His biggest complaint is his  left knee which has severe degenerative arthritis and may require knee  replacement in the future.  He takes one aspirin per day.   PHYSICAL EXAM:  Vital signs:  Blood pressure 133/74, heart rate 67,  respirations 14.  Neck:  Carotid pulses are 3+ and no audible bruits.  Neurologic:  Exam is normal.  Left neck incision is well-healed.  Upper  extremities:  Pulses 3+ bilaterally.  Chest:  Clear to auscultation.  Abdomen:  Soft, nontender with no masses.  Vascular:  He has 3+ femoral,  popliteal and dorsalis pedis pulses bilaterally.   Carotid duplex exam today reveals no evidence of significant flow  reduction in either internal carotid artery.   Generally he is doing well.  We will continue to follow him on the  carotid protocol.  He will return in 6 months for a duplex scan unless  he has any symptoms in the interim.   Quita Skye Hart Rochester, M.D.  Electronically Signed   JDL/MEDQ  D:  09/13/2007  T:  09/14/2007  Job:  [id number]

## 2010-06-17 NOTE — Discharge Summary (Signed)
NAMESTORMY, Karl [name]               ACCOUNT NO.:  [id number]   MEDICAL RECORD NO.:  [id number]          PATIENT TYPE:  INP   LOCATION:  3313                         FACILITY:  [facility]   PHYSICIAN:  Quita Skye. Hart Rochester, M.D.  DATE OF BIRTH:  09-Jul-1935   DATE OF ADMISSION:  03/04/2007  DATE OF DISCHARGE:                               DISCHARGE SUMMARY   ANTICIPATED DATE OF DISCHARGE:  March 05, 2007.   ADMISSION DIAGNOSIS:  Severe left internal carotid artery stenosis,  asymptomatic.   DISCHARGE/SECONDARY DIAGNOSES:  1. Severe left internal carotid artery stenosis, asymptomatic.  2. Status post left carotid endarterectomy.  3. Postoperative hypotension, improved.  4. Coronary artery disease status post coronary artery bypass grafting      by Dr. Andrey Campanile in 1993 and percutaneous transluminal coronary      angioplasty and stenting by Dr. Donnie Aho in 2002 and in 2006.  5. History of Class II to III chronic systolic congestive heart      failure.  6. Diabetes mellitus type 2.  7. Hypertension.  8. Hyperlipidemia.  9. History of right and left inguinal hernia repair and      hemorrhoidectomy.  10.History of pacemaker and implantable defibrillator in April of      2008.  11.Allergy to Avandamet.  12.History of ischemic cardiomyopathy.   PROCEDURES:  March 04, 2007, left carotid endarterectomy with Dacron  patch angioplasty by Dr. Josephina Gip.   BRIEF HISTORY:  Karl [name] is a 75 year old male who was found to have  carotid occlusive disease after an asymptomatic left carotid bruit led  to a Duplex scan in 2006.  This at that time revealed about 50-60%  stenosis on the left side with no flow reduction on the right.  Subsequent scanning has revealed progression of his stenosis on the left  and is now 90% left internal carotid stenosis with mild flow reduction  on the right side from a duplex in January of 2009.  He had been  asymptomatic, specifically denying history of non-hemispheric  ischemia  including TIAs, amaurosis fugax, blurred vision or syncope.  He had no  prior history of stroke.  Due to the significance of his carotid artery  stenosis Dr. Hart Rochester recommended elective left carotid endarterectomy.   HOSPITAL COURSE:  Karl [name] electively admitted to [facility]  on March 04, 2007.  He underwent the previously mentioned procedure.  Postoperatively, he was extubated neurologically intact.  While in the  recovery unit he was treated for hypotension with systolic blood  pressure in the 80s.  He received two boluses of Hespan and upon  transfer to Step Down Unit at 3300 his systolic blood pressure was  ranging in the low 100s.  Other vitals at the time showed a temperature  of 97, heart rate sinus bradycardia at 59, oxygenation 99% on 2L per  nasal cannula.  Postoperatively, Karl [name] was restarted on his home  medications although with blood pressure parameters.  He was also  started on NovoLog insulin sliding scale for his diabetes.  It is  anticipated he will remain on the Step Down  Unit over night and by the  morning of postoperative day 1 if he remains neurology intact and vitals  remain stable his arterial line will be discontinued, his diet advanced,  he will be mobilized.  We will also follow up on postoperative  laboratory values.  If he progresses in th usual fashion we anticipate  that he will be ready for discharge on postoperative day 1, March 05, 2007.  Of note, a Jackson-Pratt drain was placed intraoperatively in his  left neck since he had been on Plavix preoperatively.  As long as there  is insignificant drainage, that will be discontinued prior to his  discharge.  Currently, Karl [name] remains in stable condition.  His  preoperative labs are within normal limits except a blood glucose of  144.  His hemoglobin and hematocrit were also slightly decreased at 12.2  and 35.4 respectively.   DISCHARGE MEDICATION:  1. Carvedilol 6.25  mg p.o. daily.  2. Diovan 160 mg daily.  3. Omega 3 Fish Oil capsule 1000 mg b.i.d.  4. Fosinopril 20 mg daily.  5. Furosemide 40 mg daily.  6. Plavix 75 mg daily.  He may resume this on March 08, 2007.  7. Vytorin 10/40 mg daily.  8. Aspirin 325 mg daily.  9. Glimepiride 1 mg p.o. q.p.m.  10.Gemfibrozil 600 mg p.o. b.i.d.  11.Tylox 1-2 tablets p.o. q.6h. p.r.n. pain.   DISCHARGE INSTRUCTIONS:  1. He should continue diabetic appropriate diet.  2. Increase activity slowly.  3. Avoid driving or heavy lifting for the next 2 weeks.  4. He may shower starting February 1st.  5. He should clean the incision gently with soap and water.  6. He should call if he develops fever greater than 101, redness or      purulent drainage from his incision site or neurological changes      such as severe headache, new speech or visual changes.  7. He will see Dr. Hart Rochester at the [facility] office in approximately 2 weeks      and our office will contact him regarding specific appointment date      and time.      Jerold Coombe, P.A.      Quita Skye Hart Rochester, M.D.  Electronically Signed    AWZ/MEDQ  D:  03/04/2007  T:  03/04/2007  Job:  [id number]   cc:   Quita Skye. Hart Rochester, M.D.  Georga Hacking, M.D.  Gwen Pounds, MD

## 2010-06-20 ENCOUNTER — Ambulatory Visit
Admission: RE | Admit: 2010-06-20 | Discharge: 2010-06-20 | Disposition: A | Discharge status: Home / Self Care | Payer: Medicare Other | Source: Ambulatory Visit | Attending: Internal Medicine | Admitting: Internal Medicine

## 2010-06-20 DIAGNOSIS — R1013 Epigastric pain: Secondary | ICD-10-CM

## 2010-06-20 NOTE — Op Note (Signed)
NAMEGLADYS, GUTMAN NO.:  [id number]   MEDICAL RECORD NO.:  [id number]          PATIENT TYPE:  INP   LOCATION:  2899                         FACILITY:  [facility]   PHYSICIAN:  Doylene Canning. Ladona Ridgel, MD    DATE OF BIRTH:  1935-12-10   DATE OF PROCEDURE:  05/14/2006  DATE OF DISCHARGE:                               OPERATIVE REPORT   PROCEDURE PERFORMED:  Implantation of a single chamber defibrillator.   INDICATIONS:  Ischemic cardiomyopathy with class II heart failure, EF  35%.   I. INTRODUCTION:  The patient is a very pleasant middle-aged man with a  history of coronary disease status post MI who has an EF of 35%.  He has  congestive heart failure present, he is class II; and is now referred  for prophylactic ICD insertion.   II. PROCEDURE:  After informed consent was obtained, the patient was  taken to the diagnostic EP lab in the fasting state.  After the usual  preparation and draping, intravenous fentanyl and Metaxalone was given  for sedation.  Then 30 mL of lidocaine was infiltrated in the left  infraclavicular region.  A 7-cm incision was carried out over this  region; and electrocautery utilized to dissect down to the fascial  plane.  The cephalic vein was dissected free and the St. Jude Derata  Model [id number] active fixation defibrillation lead, serial number [id number]  was advanced into the right ventricle; and the final site on the RV  septum.  The R-waves measured 21 mV; and the impedance was 552 ohms with  the lead actively fixed.  The pacing threshold 0.4 volts at 0.5  milliseconds; 10 volt pacing did not stimulate the diaphragm.  With  these satisfactory parameters, the lead was secured to subpectoralis  fascia with figure-of-eight silk suture.  The sewing sleeve was also  secured with silk suture.  Electrocautery was utilized to make a  subcutaneous pocket.  Kanamycin irrigation was utilized to irrigate the  pocket.  Electrocautery was utilized to assure  hemostasis.  The St. Jude  model [id number] single chamber defibrillator was connected to the  defibrillation lead and placed in the subcutaneous pocket.  The  generator was secured with silk suture.  The pocket was irrigated with  kanamycin.  Electrocautery was utilized to assure hemostasis and the  incision closed with a layer of 2-0 Vicryl followed by layer of 3-0  Vicryl.  Defibrillation threshold testing was carried out.   After the patient was more deeply sedated with fentanyl and Versed, VF  was induced with a T-wave shock and a 15 joules shock was delivered  which terminated VF and restored sinus rhythm.  Five minutes was allowed  to elapse and a second defibrillation threshold test carried out.  Again, VF was induced with a T-wave shock; and, again, a 15 joules shock  was delivered which terminated VF and restored sinus rhythm.   At this point no additional defibrillation threshold testing was carried  out; and Steri-Strips were applied to the incision; a pressure dressing  was placed; and the patient was returned to his  room in satisfactory  condition.   III. COMPLICATIONS:  There were no immediate procedure complications.   IV. RESULTS:  This demonstrate successful implantation of a St. Jude's  single chamber defibrillator in a patient with an ischemic  cardiomyopathy class II heart failure and EF of 35%.      Doylene Canning. Ladona Ridgel, MD  Electronically Signed     GWT/MEDQ  D:  05/14/2006  T:  05/14/2006  Job:  [id number]   cc:   Georga Hacking, M.D.

## 2010-06-20 NOTE — Assessment & Plan Note (Signed)
[facility]                         ELECTROPHYSIOLOGY OFFICE NOTE   NAME:[name], Karl BECKSTRAND                      MRN:          [id number]  DATE:05/26/2006                            DOB:          [birth year]    Karl [name] was seen today in the clinic on May 26, 2006, for a wound  check of his newly implanted St. Jude, model number [id number].  Current  date of implant was May 14, 2006, for ischemic cardiomyopathy.  On  interrogation of his device today his battery voltage is greater than  3.20, R-waves measured greater than 12 mV with ventricular capture  threshold of 0.5 volts at 0.5 msec and a ventricular lead impedance of  440 ohms.  Shock impedance was 43.  Charge time was 9.4 seconds.  There  were no episodes since implant date.  No changes were made in his  parameters.  He does have his Steri-Strips removed today and there is a  resolving hematoma that was noted, otherwise looks fine and he will be  seen again by Dr. Ladona Ridgel in August.      Altha Harm, LPN  Electronically Signed      Doylene Canning. Ladona Ridgel, MD  Electronically Signed   PO/MedQ  DD: 05/26/2006  DT: 05/26/2006  Job #: [id number]

## 2010-06-20 NOTE — Cardiovascular Report (Signed)
[facility]  Patient:    Karl [name], Karl [name] Visit Number: [id number] MRN: [id number]          Service Type: CAT Location: [facility] 2853 01 Attending Physician:  Norman Clay Dictated by:   Darden Palmer., M.D. Proc. Date: 06/20/01 Admit Date:  06/20/2001 Discharge Date: 06/20/2001   CC:         Lilly Cove, M.D.   Cardiac Catheterization  HISTORY: The patient is a 75 year old male with a previous history of coronary artery bypass grafting 10 years ago and stenting of a vein graft three years ago. He presents with increasing chest discomfort. Some features were atypical. He has a previous abnormal Cardiolite scan.  COMMENTS ABOUT PROCEDURE: The patient tolerated the procedure well without complications and had good hemostasis present at the procedure. The grafts were selected with a right coronary catheter with the exception of the graft circumflex which was selected with an AL-1 6 French catheter. He tolerated the procedure well.  HEMODYNAMIC DATA: Aorta post contrast 140/80, LV post contrast 140/20.  ANGIOGRAPHIC DATA:  LEFT VENTRICULOGRAM: Left ventriculogram performed in the 30-degree RAO projection. The aortic valve is normal. The mitral valve was normal. The left ventricle appears mildly dilated. There was a large area of inferior akinesis noted. The anterior wall contracts normally. The estimated ejection fraction is approximately 35%. Coronary arteries arise and distribute normally. There was calcification noted in the proximal left coronary artery as well as the right coronary artery.  The left main coronary artery is diffusely narrowed.  The left anterior descending has a 99% stenosis after the septal perforator and fills through bidirectional flow as well as through the patent LIMA.  The circumflex is occluded proximally and fills by patent grafts.  The right coronary artery has a severe ostial 90%  narrowing prior to an acute marginal branch and then is subtotally occluded. A separate conus branch arises.  Saphenous vein graft to right coronary artery is occluded.  Saphenous vein graft to the OM-1 and the distal circumflex is widely patent. The proximal and distal anastomotic sites are patent. The previously stented site is widely patent with less than 10% residual narrowing. In the distal portion of the vein graft is an eccentric 20-30% stenosis present.  Internal mammary graft to the LAD is widely patent. There is bidirectional flow seen in the LAD.  IMPRESSION: 1. Severe native three-vessel coronary artery disease with occlusion of    circumflex and right coronary artery, subtotal occlusion of the left    anterior descending. 2. Patent internal mammary graft to left anterior descending. 3. Patent sequential vein graft to obtuse marginal with mild disease in    the distal portion of the graft, occlusion of the vein graft to the right    coronary artery. 4. Abnormal left ventricular function with inferior akinesis. Dictated by:   Darden Palmer., M.D. Attending Physician:  Norman Clay DD:  06/20/01 TD:  06/21/01 Job: [id number]

## 2010-06-20 NOTE — Cardiovascular Report (Signed)
NAMEMARLAN, Karl [name]               ACCOUNT NO.:  [id number]   MEDICAL RECORD NO.:  [id number]          PATIENT TYPE:  INP   LOCATION:  6523                         FACILITY:  [facility]   PHYSICIAN:  Georga Hacking, M.D.DATE OF BIRTH:  May 09, 1935   DATE OF PROCEDURE:  11/02/2004  DATE OF DISCHARGE:                              CARDIAC CATHETERIZATION   INDICATIONS:  A 75 year old male with previous bypass grafting who had a  graft lesion noted in the body of the saphenous vein graft to the distal  circumflex system. This graft had a previous stent to it. He has presented  with acute shortness of breath and some congestive heart failure and study  is done for revascularization. Options of bypass grafting were discussed  with him but in view of his depressed LV function and increased PA  pressures, this was thought to carry excessive risks. The previous stent was  widely patent proximally.   DESCRIPTION OF PROCEDURE:  The patient was brought to the cath lab and was  prepped and draped in the usual manner. After Xylocaine anesthesia, a 7-  French sheath was placed in the right femoral artery percutaneously. A 7-  [place] AL-1 guiding catheter was used. It gave good backup support. Angiomax  was begun in bolus infusion revealing an ACT of 341. A filter wire EZ was  passed down through the stenosis and positioned in the distal vessel. A 4.0  x 18 mm Vision stent was deployed at 12 atmospheres for two inflations  covering the graft. Following this, intracoronary verapamil was begun pre  and before dilatation with the stent. Postdilatation angiograms remained  showing excellent result. The basket was retrieved with the sheath and  postdilatation angiograms were obtained. The patient tolerated the procedure  well.   ANGIOGRAPHIC DATA:  Saphenous graft predilatation shows the vessel to be  smooth except for a severe eccentric 80-90% stenosis in the midportion of  the graft below the previously  stented segment. Post dilatation, the vessel  is smooth with 0% residual stenosis.   IMPRESSION:  Successful stenting of the graft to the distal circumflex.      Georga Hacking, M.D.  Electronically Signed     WST/MEDQ  D:  11/11/2004  T:  11/11/2004  Job:  [id number]   cc:   Gwen Pounds, MD  Fax: [phone]

## 2010-06-20 NOTE — Discharge Summary (Signed)
Karl [name], Karl [name]               ACCOUNT NO.:  [id number]   MEDICAL RECORD NO.:  [id number]          PATIENT TYPE:  INP   LOCATION:  6523                         FACILITY:  [facility]   PHYSICIAN:  Georga Hacking, M.D.DATE OF BIRTH:  [birth year]   DATE OF ADMISSION:  11/10/2004  DATE OF DISCHARGE:  11/12/2004                                 DISCHARGE SUMMARY   FINAL DIAGNOSES:  1.  Congestive heart failure.  2.  Coronary artery disease with coronary bypass graft disease.      1.  Status post stenting of the vein graft to the circumflex marginal          branch distally.      2.  Patent internal mammary graft.      3.  Occlusion of vein graft to right coronary artery.      4.  Patent proximal stent of the vein graft to obtuse marginal.  3.  Diabetes mellitus.  4.  Obesity.  5.  Renal insufficiency - resolved.  6.  Hypertension.   PROCEDURES:  1.  Cardiac catheterization, November 10, 2004.  2.  Stenting of the vein graft for distal protection of the vein graft to      obtuse marginal artery on November 11, 2004.   HISTORY:  [age]-year-old male with previous bypass grafting several  years ago.  In 2000, he had a stent of the vein graft to the marginal  branch.  He presented with a 2- to 3-week history of progressive dyspnea on  exertion that failed to resolve with the administration of Lasix and  nitroglycerin patch.  He was scheduled for angiography.  Please see the  previously dictated history and physical for remainder of the details.   HOSPITAL COURSE:  His laboratory data was initially done as an outpatient.  Initial creatinine was found to be 2.  CBC was normal.  BUN was 27 and  creatinine was 2 on admission, potassium 4.5.  Liver enzymes were normal.  PT and PTT were normal.  He was given Mucomyst and bicarbonate.  At  catheterization, he was found to have moderate pulmonary hypertension with a  pulmonary systolic pressure of 60/30.  His right atrial pressure was  elevated.  He was found to have occlusion of his native coronary arteries.  The internal mammary graft to diagonal and LAD was widely patent.  The  saphenous vein graft to the first marginal and distal circumflex was patent,  but had a severe eccentric 80% to 90% thrombotic stenosis between the first  marginal and the distal circumflex anastomosis.  The previous stent placed  in 2000 was widely patent.  The vein graft to the right coronary artery was  occluded with collateral filling noted of the vessel.  Options were  discussed with the patient including bypass grafting versus stenting of the  vein graft.  Because of his decreased LV function, which was estimated 25%  to 30%, it was thought that he would be at increased risk for bypass surgery  and we talked about doing stenting of the vein graft to the obtuse marginal  artery.  He was seen in consultation by Dr. Lewayne Bunting, who felt that he  likely would be a candidate for an ICD, but would need to recover from the  stenting, since his LV function may improve.  He was recommended followup in  6 weeks with a repeat echo at that time.   On November 11, 2004, he underwent stenting of the vein graft to the  circumflex marginal graft with distal protection with a 4.0 x 18-mm VISION  stent.  He tolerated this well and he had received 600 mg of Plavix prior to  this.  He tolerated this well and was able to be discharged to next day in  improved condition.  His creatinine was 1.1 the next day.  Discharge  condition is improved.   DISCHARGE DIET:  A diabetic diet.   SPECIAL DISCHARGE INSTRUCTIONS:  He is also instructed to be seen by Cardiac  Rehab and is to follow up with Phase II Rehab.   DISCHARGE MEDICATIONS:  1.  Lasix 40 mg daily.  2.  Plavix 75 mg daily.  3.  Amaryl 1 mg daily.  4.  Coreg 12.5 mg twice daily.  5.  Vytorin 10/40 mg daily.  6.  Gemfibrozil 600 twice daily.  7.  Aspirin 325 daily.  8.  Fosinopril 20 mg daily.  9.   Nitroglycerin patch daily.   ACTIVITY:  He is to walk daily and he is to be involved in the cardiac rehab  program.  He is instructed not to be around secondhand smoke.   FOLLOWUP:  He is to follow with me in 1 week and will have a repeat echo in  6 weeks.      Georga Hacking, M.D.  Electronically Signed     WST/MEDQ  D:  11/12/2004  T:  11/12/2004  Job:  [id number]   cc:   Barry Dienes. Eloise Harman, M.D.  Fax: [phone]   Doylene Canning. Ladona Ridgel, M.D.  [address]  [address]  [place]  [place] [address]

## 2010-06-20 NOTE — Discharge Summary (Signed)
Karl [name], Karl [name]               ACCOUNT NO.:  [id number]   MEDICAL RECORD NO.:  [id number]          PATIENT TYPE:  INP   LOCATION:  4729                         FACILITY:  [facility]   PHYSICIAN:  Doylene Canning. Ladona Ridgel, MD    DATE OF BIRTH:  1935/02/10   DATE OF ADMISSION:  05/14/2006  DATE OF DISCHARGE:                               DISCHARGE SUMMARY   ALLERGIES:  AVANDAMET.   This dictation greater than 35 minutes.   PRINCIPAL DIAGNOSES:  1. Discharging day one status post implant St. Jude CURRENT VR RF      cardioverter-defibrillator (single chamber).  2. History of coronary artery disease.      a.     Ischemic cardiomyopathy.      b.     Recent Myoview study, ejection fraction 32%, no ischemia,       large posterolateral scar.      c.     Status post coronary artery bypass graft surgery July 1993.      d.     Stent to left circumflex vein graft 2000.      e.     Stent to left circumflex vein graft 2006.      f.     Saphenous vein graft to the right coronary artery is       totaled.  3. Elevated creatinine.  Coming into this procedure on April 7 the      creatinine was 2.6.   SECONDARY DIAGNOSES:  1. Dyslipidemia.  2. Obesity.  3. Status post hemorrhoidectomy.  4. Status post both right and left inguinal herniorrhaphy.  5. Diabetes.  6. Class II-III chronic, systolic congestive heart failure.   PROCEDURE:  May 14, 2006, implant St. Jude single-chamber cardioverter-  defibrillator, Dr. Lewayne Bunting.  No postprocedural complications.   BRIEF HISTORY:  Karl [name] is a 75 year old male.  He had a recent  stress test done when he was hospitalized for congestive heart failure.  The study showed ejection fraction of 32%.  He is now considered for  cardioverter-defibrillator for prophylactic implantation.  The patient  has a history of coronary artery disease and underwent coronary artery  bypass graft surgery in 1993.  He had stents placed in the saphenous  vein graft to the  left circumflex on two separate occasions in 2000 and  2006.  His saphenous vein graft the right coronary artery is totaled.  He has severe native coronary artery disease.  He also has dyslipidemia,  hypertension, and diabetes.  The risks and benefits have been described  to the patient and he wishes to proceed.   HOSPITAL COURSE:  The patient presented electively on April 11.  He  underwent implantation of a St. Jude single-chamber cardioverter-  defibrillator by Dr. Ladona Ridgel.  He was observed overnight and maintained  sinus rhythm.  Chest x-ray was observed in the morning and showed that  the lead is in appropriate position.  The device has been interrogated  with all values within normal limits.  He discharges on his preoperative  medications which are:  1. Aleve 220 mg two tablets daily.  2. Enteric-coated aspirin 81 mg daily.  3. Carvedilol 3.125 mg twice daily.  4. Diovan 160 mg daily.  5. Fish oil twice daily.  6. Fosinopril 20 mg daily.  7. Furosemide 40 mg daily.  8. Gemfibrozil 600 mg twice daily.  9. Glimepiride 1 mg one tablet daily.  10.Plavix 75 mg daily.  11.Vytorin 10/40 daily at bedtime.   He follows up with Dr. Ladona Ridgel at the ICD clinic on April 23 at 10:20  a.m.  He will see Dr. Ladona Ridgel again August 19 at noon.  He is asked to  keep his incision dry for next 7 days and to sponge-bathe until Friday,  April 18.  He is asked not to drive for 1 week.  He is asked not to lift  anything heavier than 10 pounds for 4-6 weeks.   PERTINENT LABORATORIES FOR THIS ADMISSION:  Were taken on April 7:  Sodium 140, potassium 4.9, chloride 102, carbonate 27, glucose 124, BUN  is 78, creatinine 2.6.  Complete blood count:  Hemoglobin 13.2,  hematocrit 38.1, platelets are 246, and the white cells are 7.9.  Protime is 13.2, INR 1.2.      Maple Mirza, PA      Doylene Canning. Ladona Ridgel, MD  Electronically Signed    GM/MEDQ  D:  05/14/2006  T:  05/15/2006  Job:  [id number]   cc:   Georga Hacking, M.D.  Gwen Pounds, MD

## 2010-06-20 NOTE — Assessment & Plan Note (Signed)
[facility]                         ELECTROPHYSIOLOGY OFFICE NOTE   NAME:LEGGETTGrayson, Pfefferle                      MRN:          [id number]  DATE:05/06/2006                            DOB:          1935-10-04    REFERRING PHYSICIAN:  Georga Hacking, M.D.   Mr. Lepera is referred today by Dr. Viann Fish for consideration  for prophylactic ICD implantation.   HISTORY OF PRESENT ILLNESS:  The patient is a very pleasant 75 year old  man who I initially met approximately 2 or 3 years ago when he was  hospitalized with unstable angina.  The patient at that time had an EF  of 35-40% in recent intervention, and it was felt that it would be best  for a period of watchful waiting in that he was not sick enough in  regard to his LV function to recommend prophylactic ICD implantation;  however, the patient returns today for followup.  He had recently been  seen by Dr. Donnie Aho and had a stress test done after hospitalization was  carried out for congestive heart failure.  The patient returns today for  followup.  He denies apparent syncope.  He denies palpitations.  He  denies nausea or vomiting.  The patient denies peripheral edema.   His past medical history is notable for dyslipidemia and hypertension.   FAMILY HISTORY:  Notable for both mother and father being deceased, his  mother at age 41 of old age and father with an MI at age 79.  He has one  brother who is deceased from an MI at age 52.   REVIEW OF SYSTEMS:  Notable for constipation, arthritis, diabetes, and  some sexual dysfunction.   PHYSICAL EXAMINATION:  GENERAL:  Notable for him being a pleasant, well-  appearing middle-aged man in no acute distress.  VITAL SIGNS:  The blood pressure today was 150/58, pulse 70 and regular.  Respirations were 18.  Weight was 222 pounds.  HEENT:  Normocephalic and atraumatic.  Pupils are equal and round.  The  oropharynx was moist.  Sclerae are anicteric.   The carotids are 2+ and  symmetric.  NECK:  No jugular venous distention.  There was no thyromegaly.  Trachea  is midline.  LUNGS:  Clear bilaterally to auscultation.  No wheezes, rales or rhonchi  are present.  CARDIOVASCULAR:  Irregular rhythm with normal S1 and S2.  There are PVCs  present in a bigeminal fashion.  EXTREMITIES:  No clubbing, cyanosis or edema.  The pulses are 2+ and  symmetric.  NEUROLOGIC:  Alert and oriented x3.  His cranial nerves are intact.  Strength is 5/5 and symmetric.   The EKG demonstrates a sinus rhythm with ventricular bigeminy.   IMPRESSION:  1. Ischemic cardiomyopathy.  2. Congestive heart failure with ejection fraction of 35%.   DISCUSSION:  I have discussed the treatment options with the patient.  The risks, benefits, goals, and expectations of prophylactic ICD  implantation have been discussed with him, and we will plan on  scheduling this as early as possible at a convenient time.     Doylene Canning. Ladona Ridgel,  MD  Electronically Signed    GWT/MedQ  DD: 05/06/2006  DT: 05/06/2006  Job #: [id number]   cc:   Georga Hacking, M.D.  Gwen Pounds, MD

## 2010-06-20 NOTE — Cardiovascular Report (Signed)
NAMEZAUL, HUBERS NO.:  [id number]   MEDICAL RECORD NO.:  [id number]          PATIENT TYPE:  OIB   LOCATION:  2899                         FACILITY:  [facility]   PHYSICIAN:  Georga Hacking, M.D.DATE OF BIRTH:  [birth year]   DATE OF PROCEDURE:  11/10/2004  DATE OF DISCHARGE:                              CARDIAC CATHETERIZATION   HISTORY:  The patient is a 75 year old male with previous bypass grafting.  He had previous stenting of a vein graft to the marginal branch that was  patent in 2003. He presented with increasing dyspnea and heart failure that  persisted despite medical treatment. He is admitted at this time for  outpatient catheterization.   PROCEDURE:  1.  Left heart catheterization with coronary angiograms and left      ventriculogram.  2.  Right heart catheterization with measurement of pressure and      saturations,.   COMMENTS ABOUT PROCEDURE:  The right heart catheterization was done first.  The left heart catheterization was done using 6-French catheters. The vein  graft to the marginal was selected using an AL-1 guiding catheter. A 25 cc  ventriculogram was performed. The internal mammary catheter was selected  using a right catheter.   HEMODYNAMIC DATA:  Right atrium: A equals 27; B equals 25; mean equals 22.  Right ventricle: 60/12-18.  Pulmonary artery: 60/30, percent saturation 63%.  Pulmonary capillary wedge pressure: Mean equals 32.  Aorta post contrast: 134/62; percent saturation 94%.  Left ventricle 134/17-27.   ANGIOGRAPHIC DATA:  Left ventriculogram: Performed in the 30 degrees RAO  projection. The aortic valve is normal. Mitral valve is normal. Left  ventricle is dilated. There is inferior akinesis and hypokinesis of the  lateral wall. The anterior wall has some hypokinesis noted. Estimated  ejection fraction is around 25%. Coronary arteries arise and distribute  normally. There is moderate coronary calcification noted.   Left main coronary artery has moderate diffuse narrowing with a moderate  distal stenosis. The LAD is diffusely diseased and has a severe 99% stenosis  in the proximal portion. The circumflex artery is occluded. The right  coronary artery is occluded.   Saphenous vein graft to right coronary is occluded.   Saphenous vein graft to the obtuse marginal artery #1 and distal circumflex  is patent. The previous stent in the mid portion of the vessel is widely  patent. There is a new eccentric 90% stenosis with some thrombus and  ulceration noted in the mid portion of the vessel. The distal anastomotic  site appears widely patent.   Internal mammary graft sequentially to the LAD and diagonal is widely  patent.   IMPRESSION:  1.  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  2.  Patent internal mammary graft to the left anterior descending and      diagonal.  3.  Severe native three-vessel coronary artery disease and loss of vein      graft to right coronary artery.  4.  Significant left ventricular  dysfunction.  5.  Pulmonary artery hypertension.   RECOMMENDATIONS:  Consideration of stenting of the vein grafts to the obtuse  marginal with distal protection.      Georga Hacking, M.D.  Electronically Signed     WST/MEDQ  D:  11/10/2004  T:  11/10/2004  Job:  [id number]   cc:   Gwen Pounds, MD  Fax: [phone]

## 2010-06-20 NOTE — H&P (Signed)
[place]. [facility]  Patient:    Karl [name], Karl [name] Visit Number: [id number] MRN: [id number]          Service Type: CAT Location: [facility] 2853 01 Attending Physician:  Norman Clay Dictated by:   Darden Palmer., M.D. Admit Date:  06/20/2001 Discharge Date: 06/20/2001   CC:         Lilly Cove, M.D.   History and Physical  HISTORY OF PRESENT ILLNESS:  This 75 year old male is brought in at this time for cardiac catheterization.  He has a history of coronary artery bypass grafting by Dr. Particia Lather with an internal mammary graft to the LAD and diagonal in 1993, a vein graft to the obtuse marginal and distal circumflex, and a vein graft to the right coronary artery.  He had some abnormal troponins and an infarction in May 2000 and catheterization was advised at that time. He was demonstrated to have occlusion of the vein graft to the right coronary artery with collateral filling and had a severe mid shaft stenosis in the vein graft to the obtuse marginal and intermediate branch.  He underwent stenting with a 4.0 x 15 mm [id number] stent and had done well since that time.  He was in the rehab program and has been seen episodically since then.  He was seen one year ago at which time he had significant malaise and fatigue and had a stress Cardiolite study showing him to go eight minutes 45 seconds.  He had some bigeminy at peak exercise.  He had mild transient ischemic dilation of the ventricle and there was a partially reversible inferior defect and an EF of 29%.  He had been doing relatively well until about two weeks ago, when he developed some sharp right-sided chest pain and developed shortness of breath and increasing cough.  He saw Dr. Karilyn Cota and was sent over for a chest x-ray and was told he had pneumonia, but later he called back and was told he had congestive heart failure.  Since that time he has continued to have chest  pain with some atypical features to it.  It is not necessarily exertionally related, would be described as sharp, but did feel similar to the pains when he had previous heart disease.  He was sent over by Dr. Karilyn Cota, had an echocardiogram with findings of an EF of about 45-50%; valves were okay. Because of his new symptoms and previously abnormal Cardiolite and the age of his grafts, it was recommended that he go directly to coronary arteriography to further assess his vein graft status.  PAST HISTORY:  Remarkable for known diabetes mellitus.  He quit smoking 22 years ago.  Hyperlipidemia by history.  Hypertension in the past.  PREVIOUS SURGERY:  Bilateral hernia repair.  Coronary bypass grafting. Hemorrhoidectomy.  ALLERGIES:  None.  CURRENT MEDICATIONS: 1. Pravachol 20 daily. 2. Lisinopril 20 daily. 3. Toprol-XL 50 daily. 4. Glucophage 500 b.i.d. 5. Aspirin daily. 6. Lopid 600 b.i.d.  FAMILY HISTORY:  Is recorded in previously dictated records at the hospital; is reviewed and unchanged.  SOCIAL HISTORY:  He is currently not smoking.  He does not use alcohol to excess.  REVIEW OF SYSTEMS:  Significant arthritis previously.  He has been mildly obese.  He has significant impotence.  Remainder of review of systems is unremarkable.  PHYSICAL EXAMINATION:  GENERAL:  Pleasant male, appearing stated age.  VITAL SIGNS:  Weight is 214.5.  Blood pressure 124/72 sitting, 124/70 standing.  Pulse  70.  SKIN:  Warm and dry.  HEENT:  No JVD, thyromegaly, or bruits.  No diabetic retinopathy.  LUNGS:  Clear to A&P.  CARDIAC:  Normal S1, and S2.  No S3, no murmur.  ABDOMEN:  Soft and nontender.  VASCULAR:  Femoral pulses 2+, no bruits.  Peripheral pulses 2+.  No edema.  LABORATORY DATA:  Chest x-ray shows cardiomegaly.  A 12-lead ECG shows normal sinus rhythm, nonspecific S and T wave abnormality, PVCs.  IMPRESSION: 1. Atypical recurrent chest pain following bypass grafting  10 years ago with    previously abnormal Cardiolite scan, rule out coronary disease. 2. Recent dyspnea, possible pneumonia; currently cleared on chest x-ray. 3. Type 2 diabetes under treatment. 4. Hyperlipidemia under treatment. 5. Hypertension. 6. Obesity.  RECOMMENDATIONS:  Glucophage will be held.  He is brought in at this time for same-day cardiac catheterization.  The procedure was discussed with the patient fully including risks of MI, death, or CVA, and he is agreeable and willing to proceed. Dictated by:   Darden Palmer., M.D. Attending Physician:  Norman Clay DD:  06/14/01 TD:  06/15/01 Job: [id number]

## 2010-06-20 NOTE — Consult Note (Signed)
Karl [name], Karl [name]               ACCOUNT NO.:  [id number]   MEDICAL RECORD NO.:  [id number]          PATIENT TYPE:  INP   LOCATION:  6523                         FACILITY:  [facility]   PHYSICIAN:  Doylene Canning. Ladona Ridgel, M.D.  DATE OF BIRTH:  [birth year]   DATE OF CONSULTATION:  11/11/2004  DATE OF DISCHARGE:                                   CONSULTATION   INDICATIONS FOR CONSULTATION:  Consideration for possible ICD implantation.   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  The patient underwent coronary artery  bypass grafting in 1993 by Dr. Andrey Campanile.  He underwent circumflex stent vein  graft implantation in 2000.  Repeat catheterization demonstrated an occluded  saphenous vein graft to the right coronary artery.  The patient has over the  last year developed congestive heart failure symptoms.  His EF has been in  the 35 to perhaps 40% range.  Over the last several weeks, however, his  dyspnea has worsened, and despite Lasix and nitrates, continued, and he  underwent catheterization yesterday and was found to have a tight saphenous  vein graft stenosis.  Today, he underwent angioplasty and saphenous vein  graft stenting utilizing distal embolization protection.  The patient denies  syncope and denies significant palpitations.   PAST MEDICAL HISTORY:  1.  Diabetes.  2.  Dyslipidemia.  3.  Hypertension.  4.  Obesity.  5.  He also has a history of congestive heart failure as previously noted      and presently class II.  It may have been nearly class III prior to his      intervention.   FAMILY HISTORY:  Notable for a father dying at age 86 of an MI, and a mother  who died at age 72 of old age.  He had a brother who died at age 71 of  complications of coronary artery disease, another brother who is alive and  well, and a sister who has bladder cancer.   SOCIAL HISTORY:  The patient denies alcohol use.  He has a history  of  tobacco use, stopping in 1980.  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   REVIEW OF SYSTEMS:  Negative for any skin problems.  He denies vision or  hearing problems.  He does wear eyeglasses or contacts.  He denies  claudication, cough, or hemoptysis.  He has dyspnea on exertion as  previously noted.  He has had no real chest pain.  He denies palpitations.  He denies nausea, vomiting, diarrhea, constipation, polyuria, polydipsia,  heat or cold intolerance.  He does note pain in his hips with sitting and  has had chronic low back pain in the past.  He does have very minimal  dizziness at times when he stands up.  He denies recent weight changes.  The  rest of his review of systems was reviewed and found to be negative.   PHYSICAL EXAMINATION:  GENERAL:  He is a pleasant, well-appearing, obese  middle-aged man in no distress.  VITAL  SIGNS:  Blood pressure 124/60; pulse 60 and regular; respirations 18;  weight 228 pounds.  HEENT:  Normocephalic and atraumatic.  Pupils equal and round.  Oropharynx  was moist.  Sclerae were anicteric.  NECK:  Revealed no jugular venous distention.  There was no thyromegaly.  The trachea was midline. The carotids were 2+ and symmetric.  LUNGS:  Clear bilaterally to auscultation.  There were no wheezes, rales, or  rhonchi. There was no increased work of breathing.  CARDIOVASCULAR:  Revealed a regular rate and rhythm, with normal S1 and S2.  Heart sounds were somewhat distant.  There was no obvious S4.  ABDOMEN:  Soft, nontender, nondistended.  There was no organomegaly.  EXTREMITIES:  Demonstrated no cyanosis, clubbing, or edema.  The pulses were  2+ and symmetric.  His groin demonstrated no hematoma.  NEUROLOGIC:  Alert and oriented x3, with cranial nerves intact.  The  strength was 5/5 and symmetric.   The EKG demonstrates sinus rhythm, with frequent PVCs.  The QRS duration is  approximately 100-110 msec.   IMPRESSION:  1.   Ischemic cardiomyopathy, status post saphenous vein graft angioplasty      and stenting.  2.  Congestive heart failure, presently class II-III.  3.  Left ventricular dysfunction, with an ejection fraction which was      previously in the 35-40% range, now 25%.  4.  Hypertension.  5.  Dyslipidemia.  6.  Diabetes.   DISCUSSION:  I think long term his ejection fraction will likely remain less  than 35%, and he will be a candidate for ICD implantation.  However, for now  I agree with plans for medical therapy, exercise therapy, and would  recommend repeat 2D echocardiogram 6-8 weeks after his percutaneous coronary  intervention.  While most patients who undergo a repeat echocardiogram do  not have significant improvement in their LV function, we occasionally do  see patients who do have a rebound in LV function and resolution of their  heart failure symptoms after successful angioplasty particularly if the  distribution of the coronary ischemia is large.  I will plan to see him back  in EP clinic after his 2D echocardiogram if there is indication for  prophylactic ICD implantation.           ______________________________  Doylene Canning. Ladona Ridgel, M.D.     GWT/MEDQ  D:  11/11/2004  T:  11/12/2004  Job:  [id number]   cc:   Gwen Pounds, MD  Fax: [phone]

## 2010-08-03 HISTORY — PX: CARDIOVERSION: SHX1299

## 2010-08-08 ENCOUNTER — Ambulatory Visit
Admission: RE | Admit: 2010-08-08 | Discharge: 2010-08-08 | Disposition: A | Discharge status: Home / Self Care | Payer: Medicare Other | Source: Ambulatory Visit | Attending: Cardiology | Admitting: Cardiology

## 2010-08-08 ENCOUNTER — Other Ambulatory Visit: Payer: Self-pay | Admitting: Cardiology

## 2010-08-08 DIAGNOSIS — I509 Heart failure, unspecified: Secondary | ICD-10-CM

## 2010-08-13 ENCOUNTER — Ambulatory Visit (HOSPITAL_COMMUNITY)
Admission: RE | Admit: 2010-08-13 | Discharge: 2010-08-13 | Disposition: A | Discharge status: Home / Self Care | Payer: Medicare Other | Source: Ambulatory Visit | Attending: Cardiology | Admitting: Cardiology

## 2010-08-13 DIAGNOSIS — Z01810 Encounter for preprocedural cardiovascular examination: Secondary | ICD-10-CM | POA: Insufficient documentation

## 2010-08-13 DIAGNOSIS — I4891 Unspecified atrial fibrillation: Secondary | ICD-10-CM | POA: Insufficient documentation

## 2010-08-13 DIAGNOSIS — Z95810 Presence of automatic (implantable) cardiac defibrillator: Secondary | ICD-10-CM | POA: Insufficient documentation

## 2010-08-13 LAB — GLUCOSE, CAPILLARY: Glucose-Capillary: 200 mg/dL — ABNORMAL HIGH (ref 70–99)

## 2010-08-21 NOTE — Cardiovascular Report (Signed)
  NAMEELIUS, ETHEREDGE NO.:  [id number]  MEDICAL RECORD NO.:  [id number]  LOCATION:  [facility]                         FACILITY:  [facility]  PHYSICIAN:  Georga Hacking, M.D.DATE OF BIRTH:  [birth year]  DATE OF PROCEDURE:  08/13/2010                            CARDIOVERSION   PROCEDURE:  Cardioversion.  HISTORY:  The patient is a 75 year old male who has had new onset of atrial fibrillation.  He has been on Pradaxa and was brought in for cardioversion.  PROCEDURE:  Elective cardioversion.  The patient was brought to the [facility] and was prepped and draped in usual manner after anesthesia was administered by Dr. Diamantina Monks with 50 mg of propofol intravenously and 50 mg of lidocaine. Cardioversion was done with synchronized biphasic defibrillation with 100 watts resulting in reversion to sinus bradycardia.  Defibrillator was interrogated and did show previous pacing of around 7%.  He remained in sinus bradycardia but still had pacing with rates around 40 and was observed in Short-Stay following this.  He tolerated the procedure well.  IMPRESSION:  Successful cardioversion of atrial fibrillation.     Georga Hacking, M.D.     WST/MEDQ  D:  08/13/2010  T:  08/13/2010  Job:  [id number]  cc:   Gwen Pounds, MD Doylene Canning. Ladona Ridgel, MD  Electronically Signed by Lacretia Nicks. Donnie Aho M.D. on 08/21/2010 09:52:52 AM

## 2010-09-10 ENCOUNTER — Telehealth: Payer: Self-pay | Admitting: Internal Medicine

## 2010-09-10 NOTE — Telephone Encounter (Signed)
Per pt call, pt received a reminder letter in the mail for a remote device check. I told pt that it was an appt for pt to send a report in from home. Please return pt call to advise/discuss.

## 2010-09-11 NOTE — Telephone Encounter (Signed)
Told Patient to send transmission.

## 2010-09-25 ENCOUNTER — Inpatient Hospital Stay (HOSPITAL_COMMUNITY)
Admission: EM | Admit: 2010-09-25 | Discharge: 2010-09-30 | DRG: 287 | Disposition: A | Discharge status: Home / Self Care | Payer: Medicare Other | Attending: Cardiology | Admitting: Cardiology

## 2010-09-25 DIAGNOSIS — I10 Essential (primary) hypertension: Secondary | ICD-10-CM | POA: Diagnosis present

## 2010-09-25 DIAGNOSIS — M1990 Unspecified osteoarthritis, unspecified site: Secondary | ICD-10-CM | POA: Diagnosis present

## 2010-09-25 DIAGNOSIS — Z79899 Other long term (current) drug therapy: Secondary | ICD-10-CM

## 2010-09-25 DIAGNOSIS — I2589 Other forms of chronic ischemic heart disease: Secondary | ICD-10-CM | POA: Diagnosis present

## 2010-09-25 DIAGNOSIS — Z87891 Personal history of nicotine dependence: Secondary | ICD-10-CM

## 2010-09-25 DIAGNOSIS — I2510 Atherosclerotic heart disease of native coronary artery without angina pectoris: Secondary | ICD-10-CM | POA: Diagnosis present

## 2010-09-25 DIAGNOSIS — Z4901 Encounter for fitting and adjustment of extracorporeal dialysis catheter: Secondary | ICD-10-CM

## 2010-09-25 DIAGNOSIS — I2582 Chronic total occlusion of coronary artery: Secondary | ICD-10-CM | POA: Diagnosis present

## 2010-09-25 DIAGNOSIS — I252 Old myocardial infarction: Secondary | ICD-10-CM

## 2010-09-25 DIAGNOSIS — Z7982 Long term (current) use of aspirin: Secondary | ICD-10-CM

## 2010-09-25 DIAGNOSIS — E1169 Type 2 diabetes mellitus with other specified complication: Secondary | ICD-10-CM | POA: Diagnosis not present

## 2010-09-25 DIAGNOSIS — I4891 Unspecified atrial fibrillation: Principal | ICD-10-CM | POA: Diagnosis present

## 2010-09-25 DIAGNOSIS — Z9861 Coronary angioplasty status: Secondary | ICD-10-CM

## 2010-09-25 DIAGNOSIS — M109 Gout, unspecified: Secondary | ICD-10-CM | POA: Diagnosis present

## 2010-09-25 DIAGNOSIS — I5022 Chronic systolic (congestive) heart failure: Secondary | ICD-10-CM | POA: Diagnosis present

## 2010-09-25 DIAGNOSIS — I472 Ventricular tachycardia, unspecified: Secondary | ICD-10-CM | POA: Diagnosis present

## 2010-09-25 DIAGNOSIS — I25810 Atherosclerosis of coronary artery bypass graft(s) without angina pectoris: Secondary | ICD-10-CM | POA: Diagnosis present

## 2010-09-25 DIAGNOSIS — R079 Chest pain, unspecified: Secondary | ICD-10-CM

## 2010-09-25 DIAGNOSIS — I509 Heart failure, unspecified: Secondary | ICD-10-CM | POA: Diagnosis present

## 2010-09-25 DIAGNOSIS — Z95810 Presence of automatic (implantable) cardiac defibrillator: Secondary | ICD-10-CM

## 2010-09-25 DIAGNOSIS — D649 Anemia, unspecified: Secondary | ICD-10-CM | POA: Diagnosis present

## 2010-09-25 DIAGNOSIS — I4729 Other ventricular tachycardia: Secondary | ICD-10-CM | POA: Diagnosis present

## 2010-09-25 LAB — CBC
HCT: 34.9 % — ABNORMAL LOW (ref 39.0–52.0)
Hemoglobin: 11.9 g/dL — ABNORMAL LOW (ref 13.0–17.0)
MCH: 31.6 pg (ref 26.0–34.0)
MCHC: 34.1 g/dL (ref 30.0–36.0)
MCV: 92.8 fL (ref 78.0–100.0)
Platelets: 250 10*3/uL (ref 150–400)
RBC: 3.76 MIL/uL — ABNORMAL LOW (ref 4.22–5.81)
RDW: 14.2 % (ref 11.5–15.5)

## 2010-09-25 LAB — COMPREHENSIVE METABOLIC PANEL
ALT: 18 U/L (ref 0–53)
Albumin: 4.2 g/dL (ref 3.5–5.2)
BUN: 19 mg/dL (ref 6–23)
CO2: 24 mEq/L (ref 19–32)
Calcium: 9.9 mg/dL (ref 8.4–10.5)
Chloride: 106 mEq/L (ref 96–112)
Creatinine, Ser: 1.15 mg/dL (ref 0.50–1.35)
GFR calc Af Amer: >60 mL/min (ref >60)
GFR calc non Af Amer: >60 mL/min (ref >60)
Glucose, Bld: 98 mg/dL (ref 70–99)
Potassium: 3.6 mEq/L (ref 3.5–5.1)
Sodium: 141 mEq/L (ref 135–145)

## 2010-09-25 LAB — RETICULOCYTES
RBC.: 3.7 MIL/uL — ABNORMAL LOW (ref 4.22–5.81)
Retic Count, Absolute: 77.7 10*3/uL (ref 19.0–186.0)
Retic Ct Pct: 2.1 % (ref 0.4–3.1)

## 2010-09-25 LAB — CK TOTAL AND CKMB (NOT AT ARMC)
CK, MB: 3.2 ng/mL (ref 0.3–4.0)
CK, MB: 2.9 ng/mL (ref 0.3–4.0)
Relative Index: 2.8 — ABNORMAL HIGH (ref 0.0–2.5)
Relative Index: INVALID (ref 0.0–2.5)
Total CK: 113 U/L (ref 7–232)
Total CK: 96 U/L (ref 7–232)

## 2010-09-25 LAB — PRO B NATRIURETIC PEPTIDE: Pro B Natriuretic peptide (BNP): 413.5 pg/mL — ABNORMAL HIGH (ref 0–125)

## 2010-09-25 LAB — TROPONIN I: Troponin I: <0.3 ng/mL (ref <0.30)

## 2010-09-25 LAB — GLUCOSE, CAPILLARY

## 2010-09-26 ENCOUNTER — Inpatient Hospital Stay (HOSPITAL_COMMUNITY): Payer: Medicare Other

## 2010-09-26 DIAGNOSIS — I4891 Unspecified atrial fibrillation: Secondary | ICD-10-CM

## 2010-09-26 LAB — CBC
Hemoglobin: 10.5 g/dL — ABNORMAL LOW (ref 13.0–17.0)
MCH: 30.6 pg (ref 26.0–34.0)
MCHC: 32.7 g/dL (ref 30.0–36.0)

## 2010-09-26 LAB — LIPID PANEL
HDL: 44 mg/dL (ref >39)
LDL Cholesterol: 54 mg/dL (ref 0–99)
Total CHOL/HDL Ratio: 2.8 RATIO
Triglycerides: 120 mg/dL (ref <150)
VLDL: 24 mg/dL (ref 0–40)

## 2010-09-26 LAB — CARDIAC PANEL(CRET KIN+CKTOT+MB+TROPI)
Relative Index: INVALID (ref 0.0–2.5)
Total CK: 77 U/L (ref 7–232)

## 2010-09-26 LAB — HEMOGLOBIN A1C: Hgb A1c MFr Bld: 7.5 % — ABNORMAL HIGH (ref <5.7)

## 2010-09-26 LAB — BASIC METABOLIC PANEL
CO2: 26 mEq/L (ref 19–32)
Calcium: 9.1 mg/dL (ref 8.4–10.5)
Chloride: 106 mEq/L (ref 96–112)
Glucose, Bld: 135 mg/dL — ABNORMAL HIGH (ref 70–99)
Potassium: 3.5 mEq/L (ref 3.5–5.1)
Sodium: 140 mEq/L (ref 135–145)

## 2010-09-26 LAB — HEPARIN LEVEL (UNFRACTIONATED): Heparin Unfractionated: 0.36 IU/mL (ref 0.30–0.70)

## 2010-09-26 LAB — GLUCOSE, CAPILLARY: Glucose-Capillary: 137 mg/dL — ABNORMAL HIGH (ref 70–99)

## 2010-09-27 LAB — CBC
HCT: 29.5 % — ABNORMAL LOW (ref 39.0–52.0)
Hemoglobin: 9.9 g/dL — ABNORMAL LOW (ref 13.0–17.0)
MCHC: 33.6 g/dL (ref 30.0–36.0)
MCV: 93.7 fL (ref 78.0–100.0)
RDW: 14.6 % (ref 11.5–15.5)

## 2010-09-28 LAB — GLUCOSE, CAPILLARY
Glucose-Capillary: 141 mg/dL — ABNORMAL HIGH (ref 70–99)
Glucose-Capillary: 179 mg/dL — ABNORMAL HIGH (ref 70–99)

## 2010-09-29 LAB — CBC
HCT: 32 % — ABNORMAL LOW (ref 39.0–52.0)
HCT: 31.8 % — ABNORMAL LOW (ref 39.0–52.0)
Hemoglobin: 10.7 g/dL — ABNORMAL LOW (ref 13.0–17.0)
Hemoglobin: 10.4 g/dL — ABNORMAL LOW (ref 13.0–17.0)
MCH: 30.7 pg (ref 26.0–34.0)
MCHC: 32.7 g/dL (ref 30.0–36.0)
MCV: 93.8 fL (ref 78.0–100.0)
RBC: 3.39 MIL/uL — ABNORMAL LOW (ref 4.22–5.81)
RDW: 14.4 % (ref 11.5–15.5)
WBC: 6.8 10*3/uL (ref 4.0–10.5)

## 2010-09-29 LAB — BASIC METABOLIC PANEL
BUN: 16 mg/dL (ref 6–23)
Chloride: 108 mEq/L (ref 96–112)
GFR calc non Af Amer: >60 mL/min (ref >60)
Glucose, Bld: 139 mg/dL — ABNORMAL HIGH (ref 70–99)
Potassium: 3.6 mEq/L (ref 3.5–5.1)
Sodium: 141 mEq/L (ref 135–145)

## 2010-09-29 LAB — HEPARIN LEVEL (UNFRACTIONATED): Heparin Unfractionated: 0.28 IU/mL — ABNORMAL LOW (ref 0.30–0.70)

## 2010-09-29 LAB — IRON AND TIBC
Iron: 63 ug/dL (ref 42–135)
TIBC: 288 ug/dL (ref 215–435)
UIBC: 225 ug/dL (ref 125–400)

## 2010-09-29 LAB — PROTIME-INR
INR: 1.15 (ref 0.00–1.49)
Prothrombin Time: 14.9 seconds (ref 11.6–15.2)

## 2010-09-29 LAB — RETICULOCYTES: Retic Ct Pct: 2 % (ref 0.4–3.1)

## 2010-09-29 LAB — GLUCOSE, CAPILLARY
Glucose-Capillary: 197 mg/dL — ABNORMAL HIGH (ref 70–99)
Glucose-Capillary: 178 mg/dL — ABNORMAL HIGH (ref 70–99)

## 2010-09-29 LAB — POCT ACTIVATED CLOTTING TIME: Activated Clotting Time: 133 seconds

## 2010-09-30 LAB — CBC
HCT: 32.6 % — ABNORMAL LOW (ref 39.0–52.0)
Platelets: 205 10*3/uL (ref 150–400)
RBC: 3.48 MIL/uL — ABNORMAL LOW (ref 4.22–5.81)
RDW: 14.5 % (ref 11.5–15.5)
WBC: 7.3 10*3/uL (ref 4.0–10.5)

## 2010-09-30 LAB — BASIC METABOLIC PANEL
CO2: 25 mEq/L (ref 19–32)
Chloride: 108 mEq/L (ref 96–112)
GFR calc Af Amer: >60 mL/min (ref >60)
Potassium: 3.6 mEq/L (ref 3.5–5.1)

## 2010-09-30 LAB — FOLATE RBC: RBC Folate: 871 ng/mL — ABNORMAL HIGH (ref >=366)

## 2010-09-30 LAB — GLUCOSE, CAPILLARY: Glucose-Capillary: 144 mg/dL — ABNORMAL HIGH (ref 70–99)

## 2010-10-04 LAB — AMIODARONE LEVEL
Amiodarone Lvl: <0.3 ug/mL — ABNORMAL LOW (ref 1.5–2.5)
N-Desethyl-Amiodarone: <0.3 ug/mL — ABNORMAL LOW (ref 1.5–2.5)

## 2010-10-13 NOTE — Discharge Summary (Signed)
NAMEKALID, GHAN NO.:  [id number]  MEDICAL RECORD NO.:  [id number]  LOCATION:  2009                         FACILITY:  [facility]  PHYSICIAN:  Georga Hacking, M.D.DATE OF BIRTH:  1935/07/08  DATE OF ADMISSION:  09/25/2010 DATE OF DISCHARGE:  09/30/2010                              DISCHARGE SUMMARY   FINAL DIAGNOSES: 1. Defibrillator discharge, thought due to rapid atrial fibrillation. 2. Possible ventricular tachycardia. 3. Coronary artery disease with ischemic cardiomyopathy.     a.     Previous bypass grafting in 1993.     b.     Patent mammary graft to the left anterior descending and      diagonal and patent sequential vein graft to the first and second      marginal branch, occluded graft to the right coronary artery which      is chronic with collateral filling. 4. Osteoarthritis. 5. History of gout. 6. Anemia, normochromic, normocytic. 7. Previous atrial fibrillation. 8. Hyperlipidemia, under treatment. 9. Diabetes mellitus. 10.Congestive heart failure, chronic systolic.  PROCEDURES:  2-D echocardiogram and cardiac catheterization.  CONSULTATIONS:  Gwen Pounds, MD and Doylene Canning. Ladona Ridgel, MD  REASON FOR ADMISSION:  Defibrillator discharge.  HISTORY:  The patient is a 75 year old male with paroxysmal atrial fibrillation, coronary artery disease, and previous bypass grafting. The patient had previously been cardioverted into sinus rhythm, but stopped taking amiodarone because of tremor recently.  In addition following cardioversion, he had severe bradycardia and his beta-blocker had been discontinued recently.  He was in his usual state of health, but while walking in the woods and putting a bear tree stands, he would stop due to shortness of breath and severe fatigue.  He put up 2 tree stands, but had very difficulty doing this and when he was finishing and came of the woods, he had a defibrillator discharge and came to the emergency room.  He  actually felt well enough after the discharge to drop in self home.  Please see previously dictated history and physical for remainder of the details.  HOSPITAL COURSE:  Hemoglobin is 11.9, hematocrit is 34.9, reticulocyte count was 2.1%, and glucose was 98 on admission.  Liver enzymes were normal.  BUN is 19 and creatinine is 1.15.  Hemoglobin A1c is 7.5.  B natriuretic peptide is 413.  Troponins were all negative.  Cholesterol was 122 with a triglyceride of 120, HDL of 44, and LDL of 54.  TSH is 5.890.  The patient was admitted to the hospital and was placed on intravenous heparin.  His Pradaxa was discontinued.  He was seen in consultation by Dr. Lewayne Bunting who felt that he had had some atrial fibrillation that may have precipitated the AICD firing.  Because of the exertional symptoms, catheterization was recommended.  He required 3 days for the Pradaxa to washout of the system and this was able to be accomplished and he underwent cardiac catheterization on September 29, 2010.  His ejection fraction was 35% with inferior akinesis.  The native coronaries were occluded.  The mammary graft to LAD diagonal was widely patent. The saphenous vein graft to the first and second marginal branch was widely patent  and the 2 previous stents were widely patent.  There is a midvessel 30% to 40% stenosis which was unchanged from before the previous graft to the right coronary had occluded chronically in a collateral filling from the left coronary system through the graft to this OM as well as the mammary graft.  He was placed on beta-blockers and his gemfibrozil was withheld.  His diabetes remained of under good control.  He had an anemia panel showing iron of 63, TIBC of 288, and percent saturation of 22%.  Repeat TSH is 6.751.  It was thought that he had recurrent atrial fibrillation.  The options for take care of him would be in a great of his defibrillator to avoid inappropriate sensing and  time out the use of beta-blockers.  He did have some ventricular pacing noted while in the hospital and was opted to continue him on beta-blockers on discharge.  He was feeling much better and will have an outpatient followup with Dr. Lewayne Bunting.  An amiodarone level was drawn, but had not returned by the time he was discharged.  He is discharged at this time in improved condition on carvedilol 6.25 mg b.i.d., furosemide 40 mg one half tablet daily, nitroglycerin p.r.n., Pradaxa 150 mg b.i.d., aspirin 81 mg daily, colchicine 0.6 mg as needed, vitamin B12 2500 micrograms daily, fish oil 1000 mg b.i.d., glimepiride 1 mg b.i.d., losartan 100 mg daily, simvastatin 40 mg daily at bedtime, and tramadol 50 mg as needed for pain.  He is to follow up with me in the office in 1-2 weeks and is to call Dr. Ladona Ridgel to get a followup appointment with him.  We will follow up the amiodarone level.  Dr. Timothy Lasso will follow up the elevated TSH and the anemia as an outpatient.     Georga Hacking, M.D.     WST/MEDQ  D:  09/30/2010  T:  09/30/2010  Job:  [id number]  cc:   Gwen Pounds, MD Doylene Canning. Ladona Ridgel, MD  Electronically Signed by Lacretia Nicks. Donnie Aho M.D. on 10/13/2010 03:47:54 PM

## 2010-10-13 NOTE — Cardiovascular Report (Signed)
NAMERUFFIN, LADA NO.:  [id number]  MEDICAL RECORD NO.:  [id number]  LOCATION:  2009                         FACILITY:  [facility]  PHYSICIAN:  Georga Hacking, M.D.DATE OF BIRTH:  [birth year]  DATE OF PROCEDURE:  09/29/2010                            CARDIAC CATHETERIZATION   HISTORY:  A 75 year old male with recurrent defibrillator shocks.  Study is done to evaluate patency of grafts.  PROCEDURE:  Left heart catheterization with coronary angiograms and left ventriculogram.  Angiograms of bypass grafts.  COMMENTS ABOUT PROCEDURE:  The patient was brought to the cath lab and was prepped and draped in the usual manner.  After Xylocaine anesthesia, a 5-French sheath was placed in the right femoral percutaneously using a single anterior needle wall stick.  Angiograms were made of the left coronary artery and the right coronary artery and right graft and the mammary graft with 5 right catheter and a 5 left catheter.  A 5-French AL-1 catheter was then used to select the graft to the marginal branch. He tolerated the procedure well and was taken to the holding area for sheath removal.  HEMODYNAMIC DATA:  Aorta postcontrast 131/53, LV postcontrast 131/10-21.  ANGIOGRAPHIC DATA:  Left ventriculogram:  Performed in the 30 degrees RAO projection.  The aortic valve is normal.  Left ventricle is normal in size.  It is mildly dilated.  There is akinesis of the inferior wall with an estimated ejection fraction of 35%.  Coronary arteries:  Arise and distribute normally.  Heavily calcified left main.  Coronary artery is diffusely narrowed.  The LAD has a severe 99% stenosis prior to some septal perforators and then subtotally occluded with some collateral filling noted from the LIMA graft.  The circumflex is occluded.  The right coronary artery is occluded in its midportion and fills by collateral filling from the graft to the circumflex as well as marginal branch that  fills from the LIMA graft. Saphenous vein graft to the right coronary artery is occluded.  The saphenous vein graft to the first and second marginal branches is widely patent.  The previous stents are patent.  There is a 30-40% stenosis between the stents, but it appears similar to previous catheterization 4 years ago.  The internal mammary graft to the LAD and diagonal branch is widely patent with patent proximal and distal anastomotic sites.  IMPRESSION: 1. Severe native three-vessel coronary artery disease with occlusion     of native vessels. 2. Long-term patency of this sequential internal mammary graft to the     left anterior descending coronary artery and diagonal, long-term     patency of the vein graft to the circumflex, marginal 1 and 2 with     mild stenosis     unchanged from 2008. 3. Occlusion of the vein graft to the right coronary artery with     collaterals.  RECOMMENDATIONS:  Continue medical therapy with beta-blockers.  Check amiodarone level.  Plan is for EP.     Georga Hacking, M.D.     WST/MEDQ  D:  09/29/2010  T:  09/29/2010  Job:  [id number]  cc:   Gwen Pounds, MD  Electronically Signed by Lacretia Nicks.  Donnie Aho M.D. on 10/13/2010 03:48:08 PM

## 2010-10-18 NOTE — H&P (Signed)
NAMEJAQUELL, SEDDON NO.:  [id number]  MEDICAL RECORD NO.:  [id number]  LOCATION:  2924                         FACILITY:  [facility]  PHYSICIAN:  Marca Ancona, MD      DATE OF BIRTH:  Jul 24, 1935  DATE OF ADMISSION:  09/25/2010 DATE OF DISCHARGE:                             HISTORY & PHYSICAL   PRIMARY CARDIOLOGIST:  Georga Hacking, M.D.  PRIMARY CARE PHYSICIAN:  Gwen Pounds, MD.  HISTORY OF PRESENT ILLNESS:  This is a 75 year old with history of paroxysmal atrial fibrillation, coronary artery disease status post coronary artery bypass grafting, ischemic cardiomyopathy who presented with exertional chest pain as well as ventricular tachycardia with ICD discharge today.  Prior to today, the patient had been doing well with no chest pain and minimal exertional dyspnea.  Today, he was walking with the woods and clubbed the bear stands.  Every 30-40 feet of walking, he would have to stop to rest due to the substernal chest tightness.  He had chest tightness multiple times throughout the day while he was in the woods.  Finally when he was finished and was out of the woods, his defibrillator discharged.  He called Dr. York Spaniel office and he was told to come to the emergency room.  He is currently in the ER.  He is chest pain free.  He has had no chest pain at rest today, only with exertion.  ICD interrogation today showed 5 ventricular tachycardia episodes during the day today.  There was one successful shock at 25 joules at 2:30 p.m.  MEDICATIONS: 1. Amaryl 1 mg b.i.d. 2. Aspirin 81 mg daily. 3. Colchicine. 4. Fish oil. 5. Lasix 20 mg daily. 6. Gemfibrozil 600 mg b.i.d. 7. Losartan 100 mg daily. 8. Pradaxa 75 mg b.i.d. 9. Zocor 20 mg daily.  PAST MEDICAL HISTORY: 1. Atrial fibrillation.  The patient had recent onset of atrial     fibrillation with direct current cardioversion to normal sinus     rhythm on August 13, 2010.  He is currently in sinus rhythm  today. 2. Carotid artery disease.  The patient had left carotid     endarterectomy in January 2009. 3. Osteoarthritis. 4. Coronary artery disease status post coronary artery bypass grafting     in 1993.  Last heart catheterization was in September 2008 that     showed a patent LIMA to the LAD and a patent sequential saphenous     vein graft to the first obtuse marginal and the distal circumflex.     There were stents in the saphenous vein graft that were patent.     The saphenous vein graft to the RCA was totally occluded.  The     native RCA, the native LAD, and the native circumflex were totally     occluded.  There were collaterals to the distal RCA from the left     system 5. Ischemic cardiomyopathy.  The patient has a St. Jude single chamber     ICD.  Last echo in our system was in 2004.  EF then was 35% to 40%     with mild-to-moderate LV dilation.  SOCIAL HISTORY:  The patient  lives [place] with his wife.  He is retired.  He quit smoking in 1977.  He rarely drinks alcohol.  FAMILY HISTORY:  The patient's father died of heart attack at the age of 25.  REVIEW OF SYSTEMS:  All systems were reviewed and were negative except that noted in the history of present illness.  PHYSICAL EXAMINATION:  VITAL SIGNS:  Pulse is in the 60s and regular, blood pressure initially was 152/60, currently 126/53, oxygen saturation 95% on room air.  The patient is afebrile. GENERAL:  Well-developed male in no apparent distress. NEUROLOGIC:  Alert and oriented x3.  Normal affect. HEENT:  Normal exam. ABDOMEN:  Soft, nontender.  No hepatosplenomegaly.  Normal bowel sounds. NECK:  There is no thyromegaly or thyroid nodule.  JVP is mildly elevated at 8-9 cm of water. CARDIOVASCULAR:  Heart regular, S1-S2.  No S3, no S4.  There is a 2/6 holosystolic murmur at the left lower sternal border that extends to the apex.  There is no peripheral edema. EXTREMITIES:  No clubbing or cyanosis. LUNGS:  There  are slight crackles at the bases bilaterally. SKIN:  Normal exam. MUSCULOSKELETAL:  Normal exam.  EKG shows normal sinus rhythm with a rate of 75.  There are T-wave inversions in the inferior leads.  There are slight inferior Qs as well.  LABORATORY DATA:  White count 9.8, hematocrit 34.9, platelets 250. Potassium 3.6, creatinine 1.15.  GFR greater than 60.  Initial set of cardiac markers were negative.  IMPRESSION:  A 74 year old with history of coronary artery disease status post coronary artery bypass grafting, ischemic cardiomyopathy, paroxysmal atrial fibrillation who presents with exertional chest pain and ventricular tachycardia. 1. Ventricular tachycardia.  The patient had an ICD discharge x1.  ICD     interrogation showed 5 episodes of ventricular tachycardia.     Question is whether this is scar related ventricular tachycardia     versus ischemic ventricular tachycardia.  The patient has had     unstable angina type symptoms today that are worrisome for ischemia     related ventricular tachycardia.  I will plan on adding a beta-     blocker, Coreg 6.25 mg twice a day.  Will we will replete     potassium.  If he has recurrent ventricular tachycardia beginning     beta-blocker, I would start amiodarone.  I would consider an     ischemia evaluation in this person with left heart catheterization     tomorrow. 2. Coronary artery disease.  The patient's symptoms earlier today were     consistent with an unstable angina.  He has been having chest     tightness with exertion.  Today, his cardiac enzymes first set were     negative.  We will cycle his cardiac enzymes.  He will be on     aspirin, beta-blocker, and statin.  I will give a strong     consideration for catheterization tomorrow.  The patient's last     dose of Pradaxa was this morning dose today.  He has had not his     evening dose.  We will plan on starting heparin drip 12 hours after     his last dose of Pradaxa. 3.  Anticoagulation.  The patient is on Pradaxa now for greater than 4     weeks post cardioversion.  I am going to hold the Pradaxa     currently.  His last dose was this morning.  We will start a  heparin drip 12 hours after his last dose.  His GFR is greater than     60 and he is on 75 mg twice a day of Pradaxa, so he should be able     to be safely cathed about 24 hours after his last Pradaxa dose. 4. Atrial fibrillation.  The patient is currently in normal sinus     rhythm. 5. Congestive heart failure.  The patient has mild volume overload on     exam.  We will not plan aggressive     diuresis pre-catheterization, however, we will continue his p.o.     Lasix for now.  I would consider change to IV Lasix post cath.  I     will check a BNP and an echocardiogram if none has been done     recently in Dr. York Spaniel office.     Marca Ancona, MD     DM/MEDQ  D:  09/25/2010  T:  09/26/2010  Job:  [id number]  Electronically Signed by Marca Ancona MD on 10/18/2010 11:27:43 PM

## 2010-10-20 ENCOUNTER — Encounter: Payer: Self-pay | Admitting: Internal Medicine

## 2010-10-21 ENCOUNTER — Ambulatory Visit (INDEPENDENT_AMBULATORY_CARE_PROVIDER_SITE_OTHER): Payer: Medicare Other | Admitting: Internal Medicine

## 2010-10-21 ENCOUNTER — Encounter: Payer: Self-pay | Admitting: Internal Medicine

## 2010-10-21 DIAGNOSIS — Z95810 Presence of automatic (implantable) cardiac defibrillator: Secondary | ICD-10-CM

## 2010-10-21 DIAGNOSIS — I5022 Chronic systolic (congestive) heart failure: Secondary | ICD-10-CM

## 2010-10-21 DIAGNOSIS — I4891 Unspecified atrial fibrillation: Secondary | ICD-10-CM

## 2010-10-21 DIAGNOSIS — I2589 Other forms of chronic ischemic heart disease: Secondary | ICD-10-CM

## 2010-10-21 LAB — ICD DEVICE OBSERVATION
BATTERY VOLTAGE: 2.9027 V
BRDY-0002RV: 40 {beats}/min
CHARGE TIME: 11 s
HV IMPEDENCE: 40 Ohm
RV LEAD AMPLITUDE: 11.7 mv
RV LEAD IMPEDENCE ICD: 412.5 Ohm
TOT-0007: 2
TOT-0010: 6
TZON-0003SLOWVT: 360 ms
VF: 6

## 2010-10-21 NOTE — Progress Notes (Signed)
HPI Karl [name] returns today for followup. He is a very pleasant 75 year old man with an ischemic cardiomyopathy, status post myocardial infarction, paroxysmal atrial fibrillation, status post ICD implantation. The patient has been stable over the last several weeks. He denies chest pain, shortness of breath, or syncope. He notes very mild peripheral edema. This is worse when he stands up for a prolonged period of time. He notes occasional lightheadedness when he stands up quickly as well. Allergies  Allergen Reactions  . Rosiglitazone-Metformin      Current Outpatient Prescriptions  Medication Sig Dispense Refill  . aspirin 81 MG tablet Take 81 mg by mouth daily.        . carvedilol (COREG) 3.125 MG tablet Take 3.125 mg by mouth 2 (two) times daily with a meal.        . colchicine (COLCRYS) 0.6 MG tablet Take 0.6 mg by mouth as needed.        . Cyanocobalamin (VITAMIN B-12) 2500 MCG SUBL Place 1 tablet under the tongue daily.        . dabigatran (PRADAXA) 150 MG CAPS Take 150 mg by mouth 2 (two) times daily.       . furosemide (LASIX) 40 MG tablet Take 40 mg by mouth daily. 1/2 tablet as needed      . glimepiride (AMARYL) 1 MG tablet Take 1 mg by mouth 2 (two) times daily. 1/2 po daily      . losartan (COZAAR) 100 MG tablet Take 100 mg by mouth daily.        . nitroGLYCERIN (NITROSTAT) 0.4 MG SL tablet Place 0.4 mg under the tongue every 5 (five) minutes as needed.        . Omega-3 Fatty Acids (FISH OIL) 1000 MG CAPS Take by mouth 2 (two) times daily.        . simvastatin (ZOCOR) 40 MG tablet Take 40 mg by mouth at bedtime.        . traMADol (ULTRAM) 50 MG tablet Take 50 mg by mouth every 6 (six) hours as needed.           Past Medical History  Diagnosis Date  . Hypertension   . Hyperlipidemia   . Pacemaker   . Coronary artery disease     status post CABG by Dr. Andrey Campanile in 1993 and status post percutaneous transluminal coronary angioplasty and stenting by Dr. Donnie Aho in 2002 and 2006    . Carotid artery occlusion     severe left internal carotid artery stenosis, asymptomatic status post carotid endarterectomy    ROS:   All systems reviewed and negative except as noted in the HPI.   Past Surgical History  Procedure Date  . Carotid endarterectomy     left  . Pacemaker insertion     St Jude      No family history on file.   History   Social History  . Marital Status: Married    Spouse Name: N/A    Number of Children: N/A  . Years of Education: N/A   Occupational History  . Not on file.   Social History Main Topics  . Smoking status: Former Smoker    Quit date: 10/20/1980  . Smokeless tobacco: Not on file  . Alcohol Use: Not on file  . Drug Use: Not on file  . Sexually Active: Not on file   Other Topics Concern  . Not on file   Social History Narrative  . No narrative on file  BP 118/62  Pulse 63  Ht 5\' 11"  (1.803 m)  Wt 208 lb 12.8 oz (94.711 kg)  BMI 29.12 kg/m2  Physical Exam:  Well appearing NAD HEENT: Unremarkable Neck:  No JVD, no thyromegally Lymphatics:  No adenopathy Back:  No CVA tenderness Lungs:  Clear. Well healed ICD incision. HEART:  Regular rate rhythm, no murmurs, no rubs, no clicks Abd:  soft, positive bowel sounds, no organomegally, no rebound, no guarding Ext:  2 plus pulses, trace edema, no cyanosis, no clubbing Skin:  No rashes no nodules Neuro:  CN II through XII intact, motor grossly intact  DEVICE  Normal device function.  See PaceArt for details.   Assess/Plan:

## 2010-10-21 NOTE — Patient Instructions (Signed)
Your physician wants you to follow-up in: 12 months with Dr. Taylor. You will receive a reminder letter in the mail two months in advance. If you don't receive a letter, please call our office to schedule the follow-up appointment.    

## 2010-10-21 NOTE — Assessment & Plan Note (Signed)
His symptoms are currently class 1-2. He will continue his current medical therapy.

## 2010-10-21 NOTE — Assessment & Plan Note (Signed)
His current symptoms are well controlled. He will continue his current medical therapy.

## 2010-10-21 NOTE — Assessment & Plan Note (Signed)
His device is working normally today. He has had no recent ICD shocks.

## 2010-10-23 ENCOUNTER — Encounter: Payer: Medicare Other | Admitting: Internal Medicine

## 2010-10-23 LAB — CBC
HCT: 35.4 — ABNORMAL LOW
Platelets: 191
Platelets: 243
RDW: 13
WBC: 7.7
WBC: 9.5

## 2010-10-23 LAB — URINALYSIS, ROUTINE W REFLEX MICROSCOPIC
Bilirubin Urine: NEGATIVE
Hgb urine dipstick: NEGATIVE
Nitrite: NEGATIVE
Specific Gravity, Urine: 1.014
pH: 5

## 2010-10-23 LAB — CROSSMATCH
ABO/RH(D): A POS
Antibody Screen: NEGATIVE

## 2010-10-23 LAB — BASIC METABOLIC PANEL
BUN: 16
Calcium: 8.1 — ABNORMAL LOW
Creatinine, Ser: 1.09
GFR calc non Af Amer: >60
Glucose, Bld: 72
Potassium: 3.1 — ABNORMAL LOW

## 2010-10-23 LAB — COMPREHENSIVE METABOLIC PANEL
ALT: 16
AST: 14
Albumin: 4
Alkaline Phosphatase: 64
BUN: 17
Chloride: 104
GFR calc Af Amer: >60
Potassium: 3.7
Total Bilirubin: 0.5

## 2010-10-23 LAB — ABO/RH: ABO/RH(D): A POS

## 2010-10-27 ENCOUNTER — Other Ambulatory Visit: Payer: Self-pay | Admitting: Internal Medicine

## 2010-11-13 LAB — BASIC METABOLIC PANEL
BUN: 18
Chloride: 106
GFR calc Af Amer: >60
GFR calc non Af Amer: >60
Potassium: 4.6
Sodium: 142

## 2010-11-13 LAB — CBC
HCT: 35.5 — ABNORMAL LOW
MCV: 92
Platelets: 235
RBC: 3.86 — ABNORMAL LOW
WBC: 6.4

## 2010-11-13 NOTE — Consult Note (Signed)
NAMEJHONATHAN, Karl [name] NO.:  [id number]  MEDICAL RECORD NO.:  [id number]  LOCATION:  2009                         FACILITY:  [facility]  PHYSICIAN:  Doylene Canning. Ladona Ridgel, MD    DATE OF BIRTH:  09/25/1935  DATE OF CONSULTATION:  09/26/2010 DATE OF DISCHARGE:                                CONSULTATION   Consultation is requested by Dr. Viann Fish.  INDICATION FOR CONSULTATION:  Evaluation of recurrent ICD shocks.  HISTORY OF PRESENT ILLNESS:  The patient is a 75 year old male with longstanding ischemic cardiomyopathy with an ejection fraction previously of 35%.  Previously, he had, had atrial fibrillation resulting in ICD discharge.  He was in his usual state of health when he was working in the woods putting up gear stands when he felt palpitations, shortness of breath, and chest pressure.  He subsequently received an ICD shock.  He had no syncope.  He is admitted for additional evaluation.  He ruled out for MI.  The patient has had sinus rhythm with conversion after a shock.  Review of the patient's electrogram demonstrates that the patient has clearly had atrial fibrillation with a rapid ventricular response.  Unfortunately, the patient has not been able to take beta-blockers because of bradycardia and has not been able to take amiodarone because of tremors.  He has had no other syncopal episodes.  PAST MEDICAL HISTORY:  Notable for coronary artery disease status post bypass surgery.  He is status post MI.  He has a history of hypertension.  FAMILY HISTORY:  Noncontributory.  There is no premature coronary artery disease.  The patient's father died of an MI at age 35.  SOCIAL HISTORY:  The patient is a pleasant 75 year old man.  He quit smoking 30 years ago.  He rarely drinks alcoholic beverages.  He is married and retired.  REVIEW OF SYSTEMS:  All systems reviewed and negative except as noted in the HPI.  PHYSICAL EXAMINATION:  GENERAL:  He is a pleasant  75 year old man in no acute distress. VITAL SIGNS:  Blood pressure was 119/87, the pulse was 58 and regular, respirations were 18, temperature 98. HEENT:  Normocephalic and atraumatic.  Pupils equal and round. Oropharynx is moist.  Sclerae anicteric. NECK:  No jugular venous distention.  There is no thyromegaly.  Trachea is midline.  Carotids are 2+ and symmetric. LUNGS:  Clear bilaterally to auscultation.  No wheezes, rales, or rhonchi are present.  There is no increased work of breathing. CARDIOVASCULAR:  Regular rate and rhythm.  Normal S1 and S2. ABDOMEN:  Soft, nontender.  No organomegaly. EXTREMITIES:  No cyanosis, clubbing, or edema.  Pulses are 2+ and symmetric. NEUROLOGIC:  Alert and oriented x3.  Cranial nerves intact.  Strength is 5/5 and symmetric.  EKG demonstrates sinus rhythm.  IMPRESSION: 1. Recurrent ICD shocks likely secondary to ventricular tachycardia. 2. Atrial fibrillation preceding ventricular tachycardia. 3. Coronary artery disease status post myocardial infarction with     recurrent chest pain and shortness of breath. 4. Peripheral vascular disease. 5. Sinus bradycardia, on amiodarone and beta-blockers.  DISCUSSION: 1. The patient had a difficult combination of problems.  Interestingly     enough his QRS morphology  in the VT looks very much like the QRS     morphology in AFib yet in AFib he is clearly very irregularly     irregular, but his ICD shock was clearly for a regular rhythm.     This would certainly raise a question of whether he could have had     atrial flutter, though I suspect based on the rate that this was in     fact ventricular tachycardia. 2. The patient has bradycardia making beta-blockers and amiodarone     problematic.  This is particularly the case with a single VVI     system defibrillator. 3. The patient's amiodarone was stopped several weeks ago.  It is     unclear what his amiodarone level is.  If his level is not high or      rather if it is low, additional antiarrhythmic drug would be a     consideration to help try to maintain him in sinus rhythm.  At this point, my recommendation will be to proceed with left heart catheterization, check his amiodarone level, and if low consider initiation of Tikosyn.  If the amiodarone level is such that he is not a candidate for additional antiarrhythmic therapy, then we would consider outpatient ICD upgrade to a dual-chamber system secondary to sinus bradycardia.     Doylene Canning. Ladona Ridgel, MD     GWT/MEDQ  D:  09/26/2010  T:  09/27/2010  Job:  [id number]  cc:   Georga Hacking, M.D. Gwen Pounds, MD  Electronically Signed by Lewayne Bunting MD on 11/13/2010 06:41:00 PM

## 2011-02-03 DIAGNOSIS — C4490 Unspecified malignant neoplasm of skin, unspecified: Secondary | ICD-10-CM

## 2011-02-03 HISTORY — DX: Unspecified malignant neoplasm of skin, unspecified: C44.90

## 2011-04-03 HISTORY — PX: EYE SURGERY: SHX253

## 2011-04-06 ENCOUNTER — Other Ambulatory Visit: Payer: Self-pay | Admitting: Cardiology

## 2011-04-20 ENCOUNTER — Encounter: Payer: Self-pay | Admitting: Vascular Surgery

## 2011-04-21 ENCOUNTER — Ambulatory Visit: Payer: Medicare Other | Admitting: Vascular Surgery

## 2011-04-21 ENCOUNTER — Other Ambulatory Visit: Payer: Medicare Other

## 2011-06-08 HISTORY — PX: EYE SURGERY: SHX253

## 2011-06-22 ENCOUNTER — Encounter: Payer: Self-pay | Admitting: Neurosurgery

## 2011-06-23 ENCOUNTER — Ambulatory Visit (INDEPENDENT_AMBULATORY_CARE_PROVIDER_SITE_OTHER): Payer: Medicare Other | Admitting: Vascular Surgery

## 2011-06-23 ENCOUNTER — Ambulatory Visit (INDEPENDENT_AMBULATORY_CARE_PROVIDER_SITE_OTHER): Payer: Medicare Other | Admitting: Neurosurgery

## 2011-06-23 ENCOUNTER — Encounter: Payer: Self-pay | Admitting: Neurosurgery

## 2011-06-23 VITALS — BP 131/73 | HR 70 | Resp 16 | Ht 71.0 in | Wt 221.5 lb

## 2011-06-23 DIAGNOSIS — Z48812 Encounter for surgical aftercare following surgery on the circulatory system: Secondary | ICD-10-CM

## 2011-06-23 DIAGNOSIS — I6529 Occlusion and stenosis of unspecified carotid artery: Secondary | ICD-10-CM

## 2011-06-23 NOTE — Progress Notes (Signed)
[facility] [place] HISTORY AND PHYSICAL   CC: Annual carotid duplex for known stenosis Referring Physician: Hart Rochester  History of Present Illness: 76 year old male patient of Dr. Candie Chroman followed for carotid stenosis under surveillance. Patient reports no signs or symptoms of CVA, TIA, dysphasia, diplopia, amaurosis fugax or any word finding difficulty. Patient reports no signs or symptoms of PVD, he hasn't had no new medical diagnoses or no recent surgeries.  Past Medical History  Diagnosis Date  . Hypertension   . Hyperlipidemia   . Pacemaker   . Coronary artery disease     status post CABG by Dr. Andrey Campanile in 1993 and status post percutaneous transluminal coronary angioplasty and stenting by Dr. Donnie Aho in 2002 and 2006  . Carotid artery occlusion     severe left internal carotid artery stenosis, asymptomatic status post carotid endarterectomy  . Irregular heart beat   . CHF (congestive heart failure)   . Diabetes mellitus     ROS: [x]  Positive   [ ]  Denies    General: [ ]  Weight loss, [ ]  Fever, [ ]  chills Neurologic: [ ]  Dizziness, [ ]  Blackouts, [ ]  Seizure [ ]  Stroke, [ ]  "Mini stroke", [ ]  Slurred speech, [ ]  Temporary blindness; [ ]  weakness in arms or legs, [ ]  Hoarseness Cardiac: [ ]  Chest pain/pressure, [ ]  Shortness of breath at rest [ ]  Shortness of breath with exertion, [ ]  Atrial fibrillation or irregular heartbeat Vascular: [ ]  Pain in legs with walking, [ ]  Pain in legs at rest, [ ]  Pain in legs at night,  [ ]  Non-healing ulcer, [ ]  Blood clot in vein/DVT,   Pulmonary: [ ]  Home oxygen, [ ]  Productive cough, [ ]  Coughing up blood, [ ]  Asthma,  [ ]  Wheezing Musculoskeletal:  [ ]  Arthritis, [ ]  Low back pain, [ ]  Joint pain Hematologic: [ ]  Easy Bruising, [ ]  Anemia; [ ]  Hepatitis Gastrointestinal: [ ]  Blood in stool, [ ]  Gastroesophageal Reflux/heartburn, [ ]  Trouble swallowing Urinary: [ ]  chronic Kidney disease, [ ]  on HD - [ ]  MWF or [ ]  TTHS, [  ] Burning with urination, [ ]  Difficulty urinating Skin: [ ]  Rashes, [ ]  Wounds Psychological: [ ]  Anxiety, [ ]  Depression   Social History History  Substance Use Topics  . Smoking status: Former Smoker    Quit date: 10/20/1980  . Smokeless tobacco: Not on file  . Alcohol Use: 0.6 oz/week    1 Shots of liquor per week    Family History Family History  Problem Relation Age of Onset  . Diabetes Father   . Heart disease Father     Heart Disease before age 45  . Heart attack Father     Allergies  Allergen Reactions  . Rosiglitazone-Metformin     Current Outpatient Prescriptions  Medication Sig Dispense Refill  . aspirin 81 MG tablet Take 81 mg by mouth daily.        . carvedilol (COREG) 3.125 MG tablet Take 3.125 mg by mouth 2 (two) times daily with a meal.        . colchicine (COLCRYS) 0.6 MG tablet Take 0.6 mg by mouth as needed.        . Cyanocobalamin (VITAMIN B-12) 2500 MCG SUBL Place 1 tablet under the tongue daily.        . furosemide (LASIX) 40 MG tablet Take 40 mg by mouth daily. 1/2 tablet as needed      . glimepiride (AMARYL)  1 MG tablet Take 1 mg by mouth 2 (two) times daily. 1/2 po daily      . losartan (COZAAR) 100 MG tablet Take 100 mg by mouth daily.        . nitroGLYCERIN (NITROSTAT) 0.4 MG SL tablet Place 0.4 mg under the tongue every 5 (five) minutes as needed.        . Omega-3 Fatty Acids (FISH OIL) 1000 MG CAPS Take by mouth 2 (two) times daily.        Marland Kitchen PRADAXA 150 MG CAPS TAKE 1 CAPSULE BY MOUTH TWICE DAILY  60 capsule  10  . simvastatin (ZOCOR) 40 MG tablet Take 40 mg by mouth at bedtime.        . traMADol (ULTRAM) 50 MG tablet Take 50 mg by mouth every 6 (six) hours as needed.          Physical Examination  Filed Vitals:   06/23/11 1054  BP: 131/73  Pulse: 70  Resp: 16    Body mass index is 30.89 kg/(m^2).  General:  WDWN in NAD Gait: Normal HEENT: WNL Eyes: Pupils equal Pulmonary: normal non-labored breathing , without Rales, rhonchi,   wheezing Cardiac: RRR, without  Murmurs, rubs or gallops; Abdomen: soft, NT, no masses Skin: no rashes, ulcers noted  Vascular Exam Pulses: 2+ radial pulses bilaterally Carotid bruits: Bilateral carotid pulses to auscultation no bruits are heard Extremities without ischemic changes, no Gangrene , no cellulitis; no open wounds;  Musculoskeletal: no muscle wasting or atrophy   Neurologic: A&O X 3; Appropriate Affect ; SENSATION: normal; MOTOR FUNCTION:  moving all extremities equally. Speech is fluent/normal  Non-Invasive Vascular Imaging CAROTID DUPLEX 06/23/2011  Right ICA 20 - 39 % stenosis Left ICA 20 - 39 % stenosis   ASSESSMENT/PLAN: Asymptomatic carotid stenosis history of left CEA in 2009. Patient has no complaints, therefore we will follow him up in one year with repeat carotid duplex and be seen in my clinic. His questions were encouraged and answered. He knows the signs and symptoms of CVA, TIA and to go to the nearest emergency room should that occur.  Lauree Chandler ANP   Clinic MD: Hart Rochester

## 2011-06-24 NOTE — Progress Notes (Signed)
Addended by: Sharee Pimple on: 06/24/2011 09:50 AM   Modules accepted: Orders

## 2011-06-30 NOTE — Procedures (Unsigned)
CAROTID DUPLEX EXAM  INDICATION:  Carotid stenosis.  HISTORY: Diabetes:  Yes. Cardiac:  CHF, MI, CABG, stent. Hypertension:  Yes. Smoking:  Previous. Previous Surgery:  Left carotid endarterectomy on 03/04/2007. CV History:  Currently asymptomatic. Amaurosis Fugax No, Paresthesias No, Hemiparesis No.                                      RIGHT             LEFT Brachial systolic pressure:         126               126 Brachial Doppler waveforms:         WNL               WNL Vertebral direction of flow:        Antegrade         Antegrade DUPLEX VELOCITIES (cm/sec) CCA peak systolic                   124               97 - M/182 - D ECA peak systolic                   165               195 ICA peak systolic                   88                96 ICA end diastolic                   25                23 PLAQUE MORPHOLOGY:                  Heterogenous      Homogenous PLAQUE AMOUNT:                      Mild              Mild to moderate PLAQUE LOCATION:                    CCA/ICA/ECA       CCA/endarterectomy  IMPRESSION: 1. Right internal carotid artery stenosis in the 1% to 39% range. 2. Bilateral external carotid artery stenosis present. 3. Left internal carotid artery is patent with history of     endarterectomy, mild to moderate hyperplasia present at the     proximal endarterectomy site with a peak systolic velocity of 182     cm/s. 4. Bilateral vertebral arteries are patent and antegrade. 5. Minimal changes present since the previous study on 04/15/2010.  ___________________________________________ Quita Skye. Hart Rochester, M.D.  SH/MEDQ  D:  06/23/2011  T:  06/23/2011  Job:  [id number]

## 2011-07-10 ENCOUNTER — Other Ambulatory Visit: Payer: Self-pay | Admitting: Cardiology

## 2011-07-31 ENCOUNTER — Other Ambulatory Visit: Payer: Self-pay | Admitting: Cardiology

## 2011-07-31 ENCOUNTER — Ambulatory Visit
Admission: RE | Admit: 2011-07-31 | Discharge: 2011-07-31 | Disposition: A | Discharge status: Home / Self Care | Payer: Medicare Other | Source: Ambulatory Visit | Attending: Cardiology | Admitting: Cardiology

## 2011-07-31 DIAGNOSIS — R0789 Other chest pain: Secondary | ICD-10-CM

## 2011-08-10 ENCOUNTER — Encounter (HOSPITAL_COMMUNITY): Payer: Self-pay | Admitting: Cardiology

## 2011-08-10 ENCOUNTER — Inpatient Hospital Stay (HOSPITAL_COMMUNITY)
Admission: AD | Admit: 2011-08-10 | Discharge: 2011-08-14 | DRG: 309 | Disposition: A | Discharge status: Home / Self Care | Payer: Medicare Other | Source: Ambulatory Visit | Attending: Cardiology | Admitting: Cardiology

## 2011-08-10 DIAGNOSIS — E785 Hyperlipidemia, unspecified: Secondary | ICD-10-CM | POA: Diagnosis present

## 2011-08-10 DIAGNOSIS — Z7901 Long term (current) use of anticoagulants: Secondary | ICD-10-CM

## 2011-08-10 DIAGNOSIS — I4891 Unspecified atrial fibrillation: Principal | ICD-10-CM | POA: Diagnosis present

## 2011-08-10 DIAGNOSIS — I2589 Other forms of chronic ischemic heart disease: Secondary | ICD-10-CM | POA: Diagnosis present

## 2011-08-10 DIAGNOSIS — I5022 Chronic systolic (congestive) heart failure: Secondary | ICD-10-CM | POA: Diagnosis present

## 2011-08-10 DIAGNOSIS — E1159 Type 2 diabetes mellitus with other circulatory complications: Secondary | ICD-10-CM | POA: Diagnosis present

## 2011-08-10 DIAGNOSIS — I119 Hypertensive heart disease without heart failure: Secondary | ICD-10-CM | POA: Diagnosis present

## 2011-08-10 DIAGNOSIS — I509 Heart failure, unspecified: Secondary | ICD-10-CM | POA: Diagnosis present

## 2011-08-10 DIAGNOSIS — I798 Other disorders of arteries, arterioles and capillaries in diseases classified elsewhere: Secondary | ICD-10-CM | POA: Diagnosis present

## 2011-08-10 DIAGNOSIS — E876 Hypokalemia: Secondary | ICD-10-CM | POA: Diagnosis not present

## 2011-08-10 DIAGNOSIS — I4892 Unspecified atrial flutter: Secondary | ICD-10-CM | POA: Diagnosis present

## 2011-08-10 DIAGNOSIS — K449 Diaphragmatic hernia without obstruction or gangrene: Secondary | ICD-10-CM | POA: Diagnosis present

## 2011-08-10 DIAGNOSIS — Z95810 Presence of automatic (implantable) cardiac defibrillator: Secondary | ICD-10-CM

## 2011-08-10 DIAGNOSIS — I6529 Occlusion and stenosis of unspecified carotid artery: Secondary | ICD-10-CM | POA: Diagnosis present

## 2011-08-10 DIAGNOSIS — I252 Old myocardial infarction: Secondary | ICD-10-CM

## 2011-08-10 DIAGNOSIS — I2510 Atherosclerotic heart disease of native coronary artery without angina pectoris: Secondary | ICD-10-CM | POA: Diagnosis present

## 2011-08-10 DIAGNOSIS — Z951 Presence of aortocoronary bypass graft: Secondary | ICD-10-CM

## 2011-08-10 DIAGNOSIS — M109 Gout, unspecified: Secondary | ICD-10-CM | POA: Diagnosis not present

## 2011-08-10 DIAGNOSIS — Z9861 Coronary angioplasty status: Secondary | ICD-10-CM

## 2011-08-10 DIAGNOSIS — E669 Obesity, unspecified: Secondary | ICD-10-CM | POA: Diagnosis present

## 2011-08-10 HISTORY — DX: Ischemic cardiomyopathy: I25.5

## 2011-08-10 HISTORY — DX: Acute myocardial infarction, unspecified: I21.9

## 2011-08-10 HISTORY — DX: Type 2 diabetes mellitus with other circulatory complications: E11.59

## 2011-08-10 HISTORY — DX: Angina pectoris, unspecified: I20.9

## 2011-08-10 HISTORY — DX: Hypertensive heart disease without heart failure: I11.9

## 2011-08-10 HISTORY — DX: Unspecified osteoarthritis, unspecified site: M19.90

## 2011-08-10 HISTORY — DX: Occlusion and stenosis of unspecified carotid artery: I65.29

## 2011-08-10 HISTORY — DX: Personal history of other diseases of the digestive system: Z87.19

## 2011-08-10 HISTORY — DX: Unspecified atrial fibrillation: I48.91

## 2011-08-10 HISTORY — DX: Presence of automatic (implantable) cardiac defibrillator: Z95.810

## 2011-08-10 HISTORY — DX: Hyperlipidemia, unspecified: E78.5

## 2011-08-10 LAB — COMPREHENSIVE METABOLIC PANEL
Albumin: 3.7 g/dL (ref 3.5–5.2)
BUN: 17 mg/dL (ref 6–23)
Creatinine, Ser: 1.01 mg/dL (ref 0.50–1.35)
Total Bilirubin: 0.4 mg/dL (ref 0.3–1.2)
Total Protein: 7.4 g/dL (ref 6.0–8.3)

## 2011-08-10 LAB — CBC
HCT: 36.9 % — ABNORMAL LOW (ref 39.0–52.0)
MCH: 32.4 pg (ref 26.0–34.0)
MCHC: 33.9 g/dL (ref 30.0–36.0)
MCV: 95.6 fL (ref 78.0–100.0)
RDW: 12.9 % (ref 11.5–15.5)

## 2011-08-10 LAB — GLUCOSE, CAPILLARY: Glucose-Capillary: 220 mg/dL — ABNORMAL HIGH (ref 70–99)

## 2011-08-10 LAB — DIFFERENTIAL
Basophils Relative: 0 % (ref 0–1)
Lymphs Abs: 2.6 10*3/uL (ref 0.7–4.0)
Monocytes Relative: 6 % (ref 3–12)
Neutro Abs: 6.2 10*3/uL (ref 1.7–7.7)
Neutrophils Relative %: 64 % (ref 43–77)

## 2011-08-10 MED ORDER — VITAMIN B-12 2500 MCG SL SUBL: 1.0000 | SUBLINGUAL_TABLET | Freq: Every day | SUBLINGUAL | Status: DC

## 2011-08-10 MED ORDER — GLIMEPIRIDE 1 MG PO TABS
1.0000 mg | ORAL_TABLET | Freq: Two times a day (BID) | ORAL | Status: DC
  Administered 2011-08-10 – 2011-08-14 (×8): 1 mg via ORAL
  Filled 2011-08-10 (×10): qty 1

## 2011-08-10 MED ORDER — SODIUM CHLORIDE 0.9 % IJ SOLN: 3.0000 mL | INTRAMUSCULAR | Status: DC | PRN

## 2011-08-10 MED ORDER — NITROGLYCERIN 0.4 MG SL SUBL: 0.4000 mg | SUBLINGUAL_TABLET | SUBLINGUAL | Status: DC | PRN

## 2011-08-10 MED ORDER — SODIUM CHLORIDE 0.9 % IJ SOLN
3.0000 mL | Freq: Two times a day (BID) | INTRAMUSCULAR | Status: DC
  Administered 2011-08-10 – 2011-08-14 (×6): 3 mL via INTRAVENOUS

## 2011-08-10 MED ORDER — CARVEDILOL 3.125 MG PO TABS
3.1250 mg | ORAL_TABLET | Freq: Two times a day (BID) | ORAL | Status: DC
  Administered 2011-08-10 – 2011-08-14 (×8): 3.125 mg via ORAL
  Filled 2011-08-10 (×10): qty 1

## 2011-08-10 MED ORDER — SIMVASTATIN 40 MG PO TABS
40.0000 mg | ORAL_TABLET | Freq: Every day | ORAL | Status: DC
  Administered 2011-08-10 – 2011-08-13 (×4): 40 mg via ORAL
  Filled 2011-08-10 (×5): qty 1

## 2011-08-10 MED ORDER — FUROSEMIDE 40 MG PO TABS
40.0000 mg | ORAL_TABLET | Freq: Every day | ORAL | Status: DC
  Administered 2011-08-11 – 2011-08-14 (×4): 40 mg via ORAL
  Filled 2011-08-10 (×5): qty 1

## 2011-08-10 MED ORDER — LOSARTAN POTASSIUM 50 MG PO TABS
100.0000 mg | ORAL_TABLET | Freq: Every day | ORAL | Status: DC
  Administered 2011-08-10 – 2011-08-14 (×5): 100 mg via ORAL
  Filled 2011-08-10 (×5): qty 2

## 2011-08-10 MED ORDER — SODIUM CHLORIDE 0.9 % IV SOLN: 250.0000 mL | INTRAVENOUS | Status: DC | PRN

## 2011-08-10 MED ORDER — SODIUM CHLORIDE 0.9 % IJ SOLN
3.0000 mL | Freq: Two times a day (BID) | INTRAMUSCULAR | Status: DC
  Administered 2011-08-10 – 2011-08-12 (×2): 3 mL via INTRAVENOUS

## 2011-08-10 MED ORDER — ASPIRIN EC 81 MG PO TBEC
81.0000 mg | DELAYED_RELEASE_TABLET | Freq: Every day | ORAL | Status: DC
  Administered 2011-08-11 – 2011-08-14 (×4): 81 mg via ORAL
  Filled 2011-08-10 (×5): qty 1

## 2011-08-10 MED ORDER — DABIGATRAN ETEXILATE MESYLATE 150 MG PO CAPS
150.0000 mg | ORAL_CAPSULE | Freq: Two times a day (BID) | ORAL | Status: DC
  Administered 2011-08-10 – 2011-08-14 (×8): 150 mg via ORAL
  Filled 2011-08-10 (×10): qty 1

## 2011-08-10 MED ORDER — ENOXAPARIN SODIUM 40 MG/0.4ML ~~LOC~~ SOLN: 40.0000 mg | SUBCUTANEOUS | Status: DC

## 2011-08-10 MED ORDER — COLCHICINE 0.6 MG PO TABS
0.6000 mg | ORAL_TABLET | ORAL | Status: DC | PRN
  Filled 2011-08-10: qty 1

## 2011-08-10 MED ORDER — DOFETILIDE 500 MCG PO CAPS
500.0000 ug | ORAL_CAPSULE | Freq: Two times a day (BID) | ORAL | Status: DC
  Administered 2011-08-10 – 2011-08-11 (×2): 500 ug via ORAL
  Filled 2011-08-10 (×9): qty 1

## 2011-08-10 MED ORDER — TRAMADOL HCL 50 MG PO TABS: 50.0000 mg | ORAL_TABLET | Freq: Four times a day (QID) | ORAL | Status: DC | PRN

## 2011-08-10 MED ORDER — OMEGA-3-ACID ETHYL ESTERS 1 G PO CAPS
1.0000 g | ORAL_CAPSULE | Freq: Two times a day (BID) | ORAL | Status: DC
  Administered 2011-08-10 – 2011-08-14 (×8): 1 g via ORAL
  Filled 2011-08-10 (×10): qty 1

## 2011-08-10 MED ORDER — VITAMIN B-12 1000 MCG PO TABS
2000.0000 ug | ORAL_TABLET | Freq: Every day | ORAL | Status: DC
  Administered 2011-08-11 – 2011-08-14 (×4): 2000 ug via ORAL
  Filled 2011-08-10 (×5): qty 2

## 2011-08-10 NOTE — Care Management Note (Signed)
    Page 1 of 2   08/14/2011     12:04:46 PM   CARE MANAGEMENT NOTE 08/14/2011  Patient:  Karl [name], Karl [name]   Account Number:  [id number]  Date Initiated:  08/10/2011  Documentation initiated by:  SIMMONS,CRYSTAL  Subjective/Objective Assessment:   ADMITTED FOR TIKOSYN INITIATION; LIVES AT HOME WITH WIFE; WAS IPTA; USES [facility] IN [place].     Action/Plan:   DISCHARGE PLANNING DISCUSSED AT  BEDSIDE.   Anticipated DC Date:  08/11/2011   Anticipated DC Plan:  HOME/SELF CARE      DC Planning Services  CM consult  Medication Assistance      Choice offered to / List presented to:             Status of service:  Completed, signed off Medicare Important Message given?   (If response is "NO", the following Medicare IM given date fields will be blank) Date Medicare IM given:   Date Additional Medicare IM given:    Discharge Disposition:  HOME/SELF CARE  Per UR Regulation:  Reviewed for med. necessity/level of care/duration of stay  If discussed at Long Length of Stay Meetings, dates discussed:    Comments:  08/14/11 Brylon Brenning,RN,BSN  1045 RX FOR ONE WEEK'S TIKOSYN SUPPLY SENT DOWN TO PHARMACY TO BE FILLED AT 10:15, HOWEVER, PT WOULD NOT WAIT FOR IT. BEDSIDE NURSE NOTIFIED PHARMACY OF PT'S DECISION.  08/13/11 Antino Mayabb,RN,BSN 1400 CHECKED COVERAGE FOR TIKOSYN AT CURRENT DOSE.  [facility] WILL BE $95 FOR A 30 DAY SUPPLY, AND REQUIRES NO PREAUTHORIZATION.  08/10/11  1457 CRYSTAL SIMMONS RN, BSN [phone] NCM CONFIRMED THAT PT'S PHARMACY HAS TIKOSYN IN STOCK; PT WILL NEED SEPARATE SCRIPT FOR 7 DAYS SUPPLY AT DISCHARGE.

## 2011-08-10 NOTE — Progress Notes (Signed)
Initiation of Tikosyn  QTc interval is 450. Physician aware and pt has an ICD. Mag level=2.0 Potassium level = 4.0 (RN is contacting MD to see if he wants to start a supplement.) CrCl = 76.9 ml/min Pt was on amiodarone last year but is was stopped due to tremor.  Pharmacy will continue to monitor pt for electrolytes, CrCl, and drug interactions. Pharmacist will provide pt education. Pharmacy will provide a 1 week supply of drug at discharge.  Karl [name], PharmD

## 2011-08-10 NOTE — H&P (Signed)
Alleen Borne  Date of visit:  08/10/2011 DOB:  [birth year]    Age:  76 yrs. Medical record number:  [id number]     Account number:  [id number] Primary Care Provider: Gwen Pounds ____________________________ CURRENT DIAGNOSES  1. CAD,Native  2. Atrial Fibrillation  3. Chest pain, other  4. AICD in situ  5. Long Term Use Anticoagulant  6. Congestive Heart Failure Left  7. Cardiomyopathy Ischemic  8. CAD. Bypass graft  9. Hyperlipidemia  10. Hypertension-Essential (Benign)  11. MI-S/P Lateral  12. Diabetes Mellitus-NIDD  13. Stent Placement ____________________________ ALLERGIES  amiodarone, Tremor and bradycardia  AvandaMet ____________________________ MEDICATIONS  1. Aspirin 81 mg Tablet, 1 p.o. daily  2. Pradaxa 150 mg Capsule, BID  3. Fish Oil Concentrate 1,000 mg Capsule, bid  4. Colcrys 0.6 mg Tablet, PRN  5. Vitamin B-12 2,500 mcg Tablet, Sublingual, 1 qd  6. carvedilol 6.25 mg tablet, BID  7. furosemide 40 mg tablet, 1 p.o. daily  8. glimepiride 1 mg tablet, 2 qam  1 qpm  9. Tylenol 325 mg tablet, PRN  10. simvastatin 40 mg tablet, 1 p.o. daily  11. losartan 100 mg tablet, 1 p.o. daily  12. nitroglycerin 0.4 mg tablet, sublingual, PRN ____________________________ HISTORY OF PRESENT ILLNESS  This 76 year old male is brought into the hospital to initiate Tikosyn therapy. He has a previous history of ischemic cardiomyopathy and also has a history of atrial fibrillation. He was on amiodarone last year but this had to be stopped because of significant tremor. He was cardioverted last year but was severely bradycardic following the cardioversion. He then was admitted with angina as well his defibrillator discharges and was found to have rapid atrial fibrillation. Ultimately it was decided that we would try to manage him in sinus rhythm and with low-dose beta blocker therapy unless he had recurrent defibrillator discharges prior to upgrading to a dual-chamber defibrillator. He  had done well but the week prior to the Fourth of July he develop worsening angina, malaise and fatigue and was found to be back in atrial fibrillation. He wished to defer attempts to place him in sinus rhythm until after the holiday. After conversation with Sharrell Ku it was decided to admit him to the hospital to initiate Tikosyn therapy and then consider cardioversion. An echocardiogram done last week showed fairly marked left atrial enlargement and an ejection fraction of around 35%. He has significant malaise and fatigue with the atrial fibrillation. ____________________________ PAST HISTORY  Past Medical Illnesses:  hypertension, DM-non-insulin dependent, hyperlipidemia, obesity;  Cardiovascular Illnesses:  CAD, cardiomyopathy(ischemic), arrhythmia-PVCs, carotid artery disease, atrial fibrillation;  Surgical Procedures:  CABG w LIMA to LAD-dx, SVG to int-OM, SVG to RCA 08/22/91 Dr. Andrey Campanile, hemorrhoidectomy, inguinal herniorrhaphy-rt, inguinal herniorrhaphy-left, carotid endarterectomy-left 2009;  NYHA Classification:  II;  Cardiology Procedures-Invasive:  Stent circumflex vein graft 2000, Vision stent to distal SVG to circ 10/06, cardiac cath (left) September 2008, [employer] Jude AICD 4/08 Dr. Ladona Ridgel, cardioversion July 2012, cardiac cath (left) August 2012;  Cardiology Procedures-Noninvasive:  lexiscan October 2010, echocardiogram August 2011, event monitor May 2012, echocardiogram June 2012, echocardiogram July 2013;  Cardiac Cath Results:  50% stenosis distal Left main, 99% stenosis proximal LAD, occluded CFX RCA, occluded RCA SVG, widely patent LAD Diag 1 LIMA graft, patent proximal and distal stent sites in graft to marginal branch with 40% stenosis between the stents;  Peripheral Vascular Procedures:  carotid doppler March 2012;  LVEF of 35% documented via cardiac cath on 09/29/2010  CHADS Score:  3   CHA2DS2-VASC Score:  4 ____________________________ CARDIO-PULMONARY TEST DATES EKG Date:  07/31/2011;    Cardiac Cath Date:  09/29/2010;  CABG: 08/22/1991;  Stent Placement Date: 11/11/2004;  Holter/Event Monitor Date: 07/02/2010;  Nuclear Study Date:  11/29/2008;   Echocardiography Date: 08/05/2011;  Chest Xray Date: 07/31/2011;   ____________________________ FAMILY HISTORY Father - age 41,  died of myocardial infarction; Mother - age 61,  deceased; Brother 1 - age 38,  died of heart disease; Brother 2 -  alive and well; Brother 3 -  alive and well; Sister 1 -  alive and well and cancer-bladder;  ____________________________ SOCIAL HISTORY Alcohol Use:  does not use alcohol;  Smoking:  used to smoke but quit Prior to 1980;  Diet:  regular diet;  Lifestyle:  hunts and married;  Exercise:  no regular exercise;  Occupation:  retired and works part time at [employer];  Residence:  lives with wife;  Job Description:  worked at [employer];   ____________________________ REVIEW OF SYSTEMS General:  malaise and fatigue, weight loss of 10 pounds  tegumentary:  no rashes or new skin lesions.  Eyes:  wears eye glasses/contact lenses  rs, Nose, Throat, Mouth:  denies any hearing loss, epistaxis, hoarseness or difficulty speaking.  Respiratory:  dyspnea with exertion  rdiovascular:  please review HPI  Abdominal:  denies dyspepsia, GI bleeding, constipation, or diarrhea  Genurinary-Male:  frequency  Musculoskeletal:  chronic low back pain, arthritis of the left knee  Neurologic: denies headaches, stroke, or TIA,. ____________________________ PHYSICAL EXAMINATION VITAL SIGNS  Blood Pressure:  122/66   Pulse:  64/min. Weight:  211.00 lbs. Height:  71"BMI: 29  Constitutional:  pleasant white male in no acute distress Skin:  solar keratosis face Head:  normocephalic, balding male hair pattern Eyes:  EOMS Intact, PERRLA, C and S clear, Funduscopic exam not done. ENT:  full mouth dentures present, ears, nose and throat unremarkable Neck:  left carotid bruit present, no JVD, no masses, non-tender Chest:  clear to  auscultation and percussion, healed median sternotomy scar Cardiac:  irregularly irregular rhythm, normal S1 and S2, no S3 or S4, no murmur Abdomen:  abdomen soft,non-tender, no masses, no hepatospenomegaly, or aneurysm noted Peripheral Pulses:  the femoral,dorsalis pedis, and posterior tibial pulses are full and equal bilaterally with no bruits auscultated. Extremities & Back:  well healed saphenous vein donor site RLE, well healed saphenous vein donor site LLE, no edema present Neurological:  no gross motor or sensory deficits noted, affect appropriate, oriented x3. ____________________________ MOST RECENT LIPID PANEL 10/30/10  CHOL TOTL 156 mg/dl, LDL 55 calc, HDL 40 mg/dl, TRIGLYCER 027 mg/dl and CHOL/HDL 3.9 (Calc) ____________________________ IMPRESSIONS/PLAN 1. Atrial fibrillation that is likely responsible for his symptoms 2. Ischemic cardiomyopathy 3. Long-term anticoagulation with Pradaxa 4. Coronary artery disease with previous bypass grafting and stenting 5. Diabetes mellitus vascular complications  Recommendations:  His EKG shows him to be in atrial fibrillation today but no ischemic changes. Interrogation of his defibrillator shows that he had an episode of atrial fibrillation in April of this year with rapid response that lasted about 30 seconds. He has a single-chamber defibrillator so the rate today is slow and was not detected.  Admission to the hospital on an elective basis and initiate Tikosyn therapy. He may have a cardioversion once he has been loaded with Tikosyn..  Last year his grafts were patent so at this time I really don't think we need to do much of an ischemic workup.  ____________________________  Cardiology Physician:  Darden Palmer MD [facility]

## 2011-08-10 NOTE — Progress Notes (Signed)
Pt for tikosyn dosing.  QTc = 450.  MD aware.  Plan to go ahead despite long QTc in presence of ICD.  Will con't plan of care.

## 2011-08-11 LAB — BASIC METABOLIC PANEL
Calcium: 9.4 mg/dL (ref 8.4–10.5)
GFR calc Af Amer: 82 mL/min — ABNORMAL LOW (ref >90)
GFR calc non Af Amer: 71 mL/min — ABNORMAL LOW (ref >90)
Sodium: 139 mEq/L (ref 135–145)

## 2011-08-11 LAB — MAGNESIUM: Magnesium: 2 mg/dL (ref 1.5–2.5)

## 2011-08-11 NOTE — Progress Notes (Signed)
Inpatient Diabetes Program Recommendations  AACE/ADA: New Consensus Statement on Inpatient Glycemic Control  Target Ranges:  Prepandial:   less than 140 mg/dL      Peak postprandial:   less than 180 mg/dL (1-2 hours)      Critically ill patients:  140 - 180 mg/dL  Pager:  [phone] Hours:  8 am-10pm   Reason for Visit: Patient on oral agents  Inpatient Diabetes Program Recommendations Correction (SSI): Please order CBGs while on oral agents  Crissy Chiron Campione PhD, RN Diabetes Coordinator  Office:  [phone] Team Pager:  [phone]       

## 2011-08-11 NOTE — Progress Notes (Signed)
Pt's QTC was 503, MD paged, orders given to hold pm dose of Tikosyn tonight. Tikosyn held.

## 2011-08-11 NOTE — Progress Notes (Signed)
Subjective:  No complaints.  Not SOB, No chest pain.  Objective:  Vital Signs in the last 24 hours: BP 123/79  Pulse 69  Temp 97.8 F (36.6 C) (Oral)  Resp 18  Ht 5\' 11"  (1.803 m)  Wt 95.9 kg (211 lb 6.7 oz)  BMI 29.49 kg/m2  SpO2 98%  Physical Exam:  Lungs:  Clear  Cardiac:  irregular rhythm, normal S1 and S2, no S3 s Extremities:  No edema present  Intake/Output from previous day: 07/08 0701 - 07/09 0700 In: 480 [P.O.:480] Out: -  Weight Filed Weights   08/10/11 1015  Weight: 95.9 kg (211 lb 6.7 oz)    Lab Results: Basic Metabolic Panel:  Basename 08/11/11 0528 08/10/11 1211  NA 139 140  K 4.0 4.0  CL 103 103  CO2 25 27  GLUCOSE 127* 235*  BUN 17 17  CREATININE 1.02 1.01    CBC:  Basename 08/10/11 1211  WBC 9.6  NEUTROABS 6.2  HGB 12.5*  HCT 36.9*  MCV 95.6  PLT 174   Telemetry:  Atrial fibrillation.  Response controlled  Assessment/Plan:  1. Atrial fibrillation 2. Tikosyn load 3. Ischemic cardiomyopathy  Plan:   Continue load and check QT  W. Ashley Royalty  MD [facility]  08/11/2011, 8:56 AM

## 2011-08-12 LAB — GLUCOSE, CAPILLARY: Glucose-Capillary: 160 mg/dL — ABNORMAL HIGH (ref 70–99)

## 2011-08-12 LAB — BASIC METABOLIC PANEL
BUN: 18 mg/dL (ref 6–23)
CO2: 27 mEq/L (ref 19–32)
Calcium: 9.6 mg/dL (ref 8.4–10.5)
Chloride: 103 mEq/L (ref 96–112)
Creatinine, Ser: 1.09 mg/dL (ref 0.50–1.35)
Creatinine, Ser: 1.01 mg/dL (ref 0.50–1.35)
GFR calc Af Amer: 76 mL/min — ABNORMAL LOW (ref >90)
GFR calc non Af Amer: 65 mL/min — ABNORMAL LOW (ref >90)

## 2011-08-12 MED ORDER — INSULIN ASPART 100 UNIT/ML ~~LOC~~ SOLN: 0.0000 [IU] | Freq: Three times a day (TID) | SUBCUTANEOUS | Status: DC

## 2011-08-12 MED ORDER — POTASSIUM CHLORIDE CRYS ER 20 MEQ PO TBCR
EXTENDED_RELEASE_TABLET | ORAL | Status: AC
  Filled 2011-08-12: qty 2

## 2011-08-12 MED ORDER — SODIUM CHLORIDE 0.45 % IV SOLN
INTRAVENOUS | Status: DC
  Administered 2011-08-13: 20 mL/h via INTRAVENOUS

## 2011-08-12 MED ORDER — DOFETILIDE 250 MCG PO CAPS
250.0000 ug | ORAL_CAPSULE | Freq: Two times a day (BID) | ORAL | Status: DC
  Administered 2011-08-12 – 2011-08-14 (×4): 250 ug via ORAL
  Filled 2011-08-12 (×6): qty 1

## 2011-08-12 MED ORDER — POTASSIUM CHLORIDE 20 MEQ/15ML (10%) PO LIQD
40.0000 meq | Freq: Once | ORAL | Status: AC
  Administered 2011-08-12: 40 meq via ORAL
  Filled 2011-08-12: qty 30

## 2011-08-12 MED ORDER — POTASSIUM CHLORIDE CRYS ER 20 MEQ PO TBCR
20.0000 meq | EXTENDED_RELEASE_TABLET | Freq: Every day | ORAL | Status: DC
  Administered 2011-08-14: 20 meq via ORAL
  Filled 2011-08-12 (×2): qty 1

## 2011-08-12 NOTE — Progress Notes (Signed)
Subjective:  No complaints.  Not SOB, No chest pain.  Tikosyn held last night and this am for prolonged QT  Objective:  Vital Signs in the last 24 hours: BP 119/75  Pulse 66  Temp 98.2 F (36.8 C) (Oral)  Resp 18  Ht 5\' 11"  (1.803 m)  Wt 96.4 kg (212 lb 8.4 oz)  BMI 29.64 kg/m2  SpO2 95%  Physical Exam:  Lungs:  Clear  Cardiac:  irregular rhythm, normal S1 and S2, no S3 s Extremities:  No edema present  Intake/Output from previous day: 07/09 0701 - 07/10 0700 In: 240 [P.O.:240] Out: -  Weight Filed Weights   08/10/11 1015 08/12/11 0610  Weight: 95.9 kg (211 lb 6.7 oz) 96.4 kg (212 lb 8.4 oz)    Lab Results: Basic Metabolic Panel:  Basename 08/12/11 0451 08/11/11 0528  NA 141 139  K 3.6 4.0  CL 103 103  CO2 27 25  GLUCOSE 165* 127*  BUN 18 17  CREATININE 1.09 1.02    CBC:  Basename 08/10/11 1211  WBC 9.6  NEUTROABS 6.2  HGB 12.5*  HCT 36.9*  MCV 95.6  PLT 174   Telemetry:  Atrial fibrillation.  Response controlled  Assessment/Plan:  1. Atrial fibrillation 2. Tikosyn load 3. Ischemic cardiomyopathy  Plan:   Discussed with nurse.  With a defibrillator in place will tolerate some greater prolonged QT U interval.  He is now in atrial flutter.  Plan to replete K and give reduced dose of TIkosyn once K up.  Plan cardioversion in am if still out of rhythm.  Risks discussed.  Darden Palmer  MD [facility]  08/12/2011, 8:32 AM

## 2011-08-13 ENCOUNTER — Encounter (HOSPITAL_COMMUNITY): Payer: Self-pay | Admitting: Anesthesiology

## 2011-08-13 ENCOUNTER — Inpatient Hospital Stay (HOSPITAL_COMMUNITY): Payer: Medicare Other | Admitting: Anesthesiology

## 2011-08-13 ENCOUNTER — Encounter (HOSPITAL_COMMUNITY)
Admission: AD | Disposition: A | Discharge status: Home / Self Care | Payer: Self-pay | Source: Ambulatory Visit | Attending: Cardiology

## 2011-08-13 HISTORY — PX: CARDIOVERSION: SHX1299

## 2011-08-13 LAB — BASIC METABOLIC PANEL
BUN: 19 mg/dL (ref 6–23)
Calcium: 9.6 mg/dL (ref 8.4–10.5)
GFR calc non Af Amer: 61 mL/min — ABNORMAL LOW (ref >90)
Glucose, Bld: 178 mg/dL — ABNORMAL HIGH (ref 70–99)

## 2011-08-13 SURGERY — CARDIOVERSION
Anesthesia: General | Wound class: Clean

## 2011-08-13 MED ORDER — SODIUM CHLORIDE 0.9 % IJ SOLN
3.0000 mL | Freq: Two times a day (BID) | INTRAMUSCULAR | Status: DC
  Administered 2011-08-13 (×2): 3 mL via INTRAVENOUS

## 2011-08-13 MED ORDER — SODIUM CHLORIDE 0.9 % IV SOLN: 250.0000 mL | INTRAVENOUS | Status: DC

## 2011-08-13 MED ORDER — POTASSIUM CHLORIDE 20 MEQ/15ML (10%) PO LIQD
40.0000 meq | Freq: Once | ORAL | Status: AC
  Administered 2011-08-13: 40 meq via ORAL
  Filled 2011-08-13: qty 30

## 2011-08-13 MED ORDER — HYDROCORTISONE 1 % EX CREA
1.0000 "application " | TOPICAL_CREAM | Freq: Three times a day (TID) | CUTANEOUS | Status: DC | PRN
  Administered 2011-08-14: 1 via TOPICAL
  Filled 2011-08-13: qty 28

## 2011-08-13 MED ORDER — SODIUM CHLORIDE 0.9 % IJ SOLN: 3.0000 mL | INTRAMUSCULAR | Status: DC | PRN

## 2011-08-13 MED ORDER — SODIUM CHLORIDE 0.9 % IV SOLN
INTRAVENOUS | Status: DC | PRN
  Administered 2011-08-13: 12:00:00 via INTRAVENOUS

## 2011-08-13 MED ORDER — PROPOFOL 10 MG/ML IV EMUL
INTRAVENOUS | Status: DC | PRN
  Administered 2011-08-13: 80 mg via INTRAVENOUS

## 2011-08-13 NOTE — CV Procedure (Signed)
Electrical Cardioversion Procedure Note  Karl [name]   76 y.o. male MRN: [id number] DOB: 08-18-1935  Today's date: 08/13/2011  Procedure: Electrical Cardioversion  Indications:  Atrial Fibrillation  Time Out: Verified patient identification, verified procedure,medications/allergies/relevent history reviewed, required imaging and test results available.  Performed  Procedure Details  The patient was NPO after midnight. Anesthesia was administered at the beside  by [facility] with 80mg  of propofol.  Cardioversion was done with synchronized biphasic defibrillation with AP pads with 100 watts.  The patient converted to normal sinus rhythm. The patient tolerated the procedure well   IMPRESSION:  Successful cardioversion of atrial fibrillation/atrial flutter    W. Viann Fish, [name]. MD [facility]   08/13/2011, 12:48 PM

## 2011-08-13 NOTE — Anesthesia Preprocedure Evaluation (Addendum)
Anesthesia Evaluation  Patient identified by MRN, date of birth, ID band Patient awake    Reviewed: Allergy & Precautions, H&P , NPO status , Patient's Chart, lab work & pertinent test results, reviewed documented beta blocker date and time   Airway Mallampati: I TM Distance: >3 FB Neck ROM: Full    Dental   Pulmonary          Cardiovascular + angina + CAD, + Past MI and +CHF + dysrhythmias Atrial Fibrillation + pacemaker + Cardiac Defibrillator     Neuro/Psych    GI/Hepatic Neg liver ROS, hiatal hernia,   Endo/Other  Well Controlled, Type 2, Oral Hypoglycemic Agents  Renal/GU      Musculoskeletal negative musculoskeletal ROS (+)   Abdominal   Peds  Hematology negative hematology ROS (+)   Anesthesia Other Findings   Reproductive/Obstetrics                          Anesthesia Physical Anesthesia Plan  ASA: III  Anesthesia Plan: General   Post-op Pain Management:    Induction: Intravenous  Airway Management Planned: Mask  Additional Equipment:   Intra-op Plan:   Post-operative Plan:   Informed Consent: I have reviewed the patients History and Physical, chart, labs and discussed the procedure including the risks, benefits and alternatives for the proposed anesthesia with the patient or authorized representative who has indicated his/her understanding and acceptance.   Dental advisory given  Plan Discussed with: Surgeon and CRNA  Anesthesia Plan Comments:        Anesthesia Quick Evaluation

## 2011-08-13 NOTE — Transfer of Care (Signed)
Immediate Anesthesia Transfer of Care Note  Patient: Karl [name]  Procedure(s) Performed: Procedure(s) (LRB): CARDIOVERSION (N/A)  Patient Location: PACU and Nursing Unit  Anesthesia Type: General  Level of Consciousness: awake, alert  and oriented  Airway & Oxygen Therapy: Patient Spontanous Breathing and Patient connected to nasal cannula oxygen  Post-op Assessment: Report given to PACU RN and Post -op Vital signs reviewed and stable  Post vital signs: Reviewed and stable  Complications: No apparent anesthesia complications

## 2011-08-13 NOTE — Interval H&P Note (Signed)
History and Physical Interval Note:  08/13/2011 12:40 PM  Karl [name]  has presented today forcardioversion with the diagnosis of a fib  The various methods of treatment have been discussed with the patient and family. After consideration of risks, benefits and other options for treatment, the patient has consented to  CARDIOVERSION .  The patient's history has been reviewed, patient examined, no change in status, stable for surgery.  I have reviewed the patients' chart and labs.  Questions were answered to the patient's satisfaction.     Enrica Corliss JR,W SPENCER

## 2011-08-13 NOTE — Anesthesia Postprocedure Evaluation (Signed)
Anesthesia Post Note  Patient: Karl [name]  Procedure(s) Performed: Procedure(s) (LRB): CARDIOVERSION (N/A)  Anesthesia type: general  Patient location: PACU  Post pain: Pain level controlled  Post assessment: Patient's Cardiovascular Status Stable  Last Vitals:  Filed Vitals:   08/13/11 1215  BP: 142/87  Pulse: 51  Temp:   Resp: 20    Post vital signs: Reviewed and stable  Level of consciousness: sedated  Complications: No apparent anesthesia complications

## 2011-08-13 NOTE — H&P (View-Only) (Signed)
Subjective:  C/o gout in right great toe this am.  Not SOB, No chest pain.   Objective:  Vital Signs in the last 24 hours: BP 112/62  Pulse 67  Temp 97.7 F (36.5 C) (Oral)  Resp 18  Ht 5' 11" (1.803 m)  Wt 95.754 kg (211 lb 1.6 oz)  BMI 29.44 kg/m2  SpO2 95%  Physical Exam:  Lungs:  Clear  Cardiac:  irregular rhythm, normal S1 and S2, no S3 Extremities:  No edema present  Intake/Output from previous day: 07/10 0701 - 07/11 0700 In: 600 [P.O.:600] Out: -  Weight Filed Weights   08/10/11 1015 08/12/11 0610 08/13/11 0446  Weight: 95.9 kg (211 lb 6.7 oz) 96.4 kg (212 lb 8.4 oz) 95.754 kg (211 lb 1.6 oz)    Lab Results: Basic Metabolic Panel:  Basename 08/13/11 0535 08/12/11 1132  NA 138 139  K 3.8 4.4  CL 100 103  CO2 27 27  GLUCOSE 178* 175*  BUN 19 17  CREATININE 1.15 1.01    CBC:  Basename 08/10/11 1211  WBC 9.6  NEUTROABS 6.2  HGB 12.5*  HCT 36.9*  MCV 95.6  PLT 174   Telemetry:  Atrial flutter.  Response controlled  Assessment/Plan:  1. Atrial fibrillation now appears in atrial flutte 2. Tikosyn load 3. Ischemic cardiomyopathy 4. Mild hypokalemia  Plan:   Cardioversion later this am.  W. Spencer Tilley, Jr.  MD [facility]  08/13/2011, 8:47 AM 

## 2011-08-13 NOTE — Interval H&P Note (Signed)
History and Physical Interval Note:  08/13/2011 12:41 PM  Karl [name]  has presented today for surgery, with the diagnosis of a fib  The various methods of treatment have been discussed with the patient and family. After consideration of risks, benefits and other options for treatment, the patient has consented to  Procedure(s) (LRB): CARDIOVERSION (N/A) as a surgical intervention .  The patient's history has been reviewed, patient examined, no change in status, stable for surgery.  I have reviewed the patients' chart and labs.  Questions were answered to the patient's satisfaction.     Clemie General JR,W SPENCER

## 2011-08-13 NOTE — Anesthesia Procedure Notes (Signed)
Date/Time: 08/13/2011 12:45 PM Performed by: Julianne Rice K Pre-anesthesia Checklist: Patient identified, Emergency Drugs available, Suction available, Patient being monitored and Timeout performed Patient Re-evaluated:Patient Re-evaluated prior to inductionOxygen Delivery Method: Ambu bag Preoxygenation: Pre-oxygenation with 100% oxygen Intubation Type: IV induction Ventilation: Mask ventilation without difficulty

## 2011-08-13 NOTE — Progress Notes (Signed)
Subjective:  C/o gout in right great toe this am.  Not SOB, No chest pain.   Objective:  Vital Signs in the last 24 hours: BP 112/62  Pulse 67  Temp 97.7 F (36.5 C) (Oral)  Resp 18  Ht 5\' 11"  (1.803 m)  Wt 95.754 kg (211 lb 1.6 oz)  BMI 29.44 kg/m2  SpO2 95%  Physical Exam:  Lungs:  Clear  Cardiac:  irregular rhythm, normal S1 and S2, no S3 Extremities:  No edema present  Intake/Output from previous day: 07/10 0701 - 07/11 0700 In: 600 [P.O.:600] Out: -  Weight Filed Weights   08/10/11 1015 08/12/11 0610 08/13/11 0446  Weight: 95.9 kg (211 lb 6.7 oz) 96.4 kg (212 lb 8.4 oz) 95.754 kg (211 lb 1.6 oz)    Lab Results: Basic Metabolic Panel:  Basename 08/13/11 0535 08/12/11 1132  NA 138 139  K 3.8 4.4  CL 100 103  CO2 27 27  GLUCOSE 178* 175*  BUN 19 17  CREATININE 1.15 1.01    CBC:  Basename 08/10/11 1211  WBC 9.6  NEUTROABS 6.2  HGB 12.5*  HCT 36.9*  MCV 95.6  PLT 174   Telemetry:  Atrial flutter.  Response controlled  Assessment/Plan:  1. Atrial fibrillation now appears in atrial flutte 2. Tikosyn load 3. Ischemic cardiomyopathy 4. Mild hypokalemia  Plan:   Cardioversion later this am.  W. Ashley Royalty  MD [facility]  08/13/2011, 8:47 AM

## 2011-08-13 NOTE — Anesthesia Postprocedure Evaluation (Signed)
  Anesthesia Post-op Note  Patient: Karl [name]  Procedure(s) Performed: Procedure(s) (LRB): CARDIOVERSION (N/A)  Patient Location: PACU and Nursing Unit  Anesthesia Type: General  Level of Consciousness: awake, alert  and oriented  Airway and Oxygen Therapy: Patient Spontanous Breathing and Patient connected to nasal cannula oxygen  Post-op Pain: none  Post-op Assessment: Post-op Vital signs reviewed, Patient's Cardiovascular Status Stable, Respiratory Function Stable, Patent Airway and No signs of Nausea or vomiting  Post-op Vital Signs: Reviewed and stable  Complications: No apparent anesthesia complications

## 2011-08-13 NOTE — Preoperative (Signed)
Beta Blockers   Reason not to administer Beta Blockers:Not Applicable 

## 2011-08-14 ENCOUNTER — Encounter (HOSPITAL_COMMUNITY): Payer: Self-pay | Admitting: Cardiology

## 2011-08-14 MED ORDER — POTASSIUM CHLORIDE CRYS ER 20 MEQ PO TBCR: 20.0000 meq | EXTENDED_RELEASE_TABLET | Freq: Every day | ORAL | Status: DC

## 2011-08-14 MED ORDER — TRAMADOL HCL 50 MG PO TABS: 50.0000 mg | ORAL_TABLET | Freq: Four times a day (QID) | ORAL | Status: DC | PRN

## 2011-08-14 MED ORDER — COLCHICINE 0.6 MG PO TABS: 0.6000 mg | ORAL_TABLET | ORAL | Status: DC | PRN

## 2011-08-14 MED ORDER — HYDROCORTISONE SOD SUCCINATE 100 MG IJ SOLR
100.0000 mg | Freq: Once | INTRAMUSCULAR | Status: AC
  Administered 2011-08-14: 100 mg via INTRAVENOUS
  Filled 2011-08-14: qty 2

## 2011-08-14 MED ORDER — DOFETILIDE 250 MCG PO CAPS: 250.0000 ug | ORAL_CAPSULE | Freq: Two times a day (BID) | ORAL | Status: DC

## 2011-08-14 NOTE — Progress Notes (Signed)
Pt refused to wait for RX 1 week supply Tikosyn to be filled by our pharmacy, so RX given back to pt.  Stated, "I can get it filled at [facility] in no time"  Watched Tikosyn video on Mon night.  Plans to tell any MD giving RX that he is on Tikosyn and states that he always calls Dr.Tilley and asks him first if he can take a medication.  Does not plan on being compliant with diet recommendations.  "well, there's no sense in me sitting here lying to ya!"  "i eat what i want, i aint changin nothin"  D/C'd to home via [facility] to car with staff and wife.  Rxs given and explained awa d/c instructions.  States understanding.

## 2011-08-14 NOTE — Discharge Summary (Signed)
Physician Discharge Summary  Patient ID: Karl [name] MRN: [id number] DOB/AGE: 08-08-1935 76 y.o.  Admit date: 08/10/2011 Discharge date: 08/14/2011  Primary Physician: Dr Karl [name]  Primary Discharge Diagnosis: 1. Persistent atrial fibrillation-cardioverted this admission  Secondary Discharge Diagnosis: 2. Ischemic cardiomyopathy 3. Coronary artery disease with previous bypass grafting and stenting of the vein graft 4. Hypertensive heart disease 5. Diabetes mellitus with vascular complications 6. Long-term anticoagulation with Pradaxa 7. Acute gout  Procedures:: Biphasic cardioversion 08/13/11  Hospital Course: This 76 year old male has a known ischemic cardiomyopathy and has a defibrillator placed in 4 primary prevention. He developed atrial fibrillation last year but was intolerant of amiodarome. He has been managed medically following a cardioversion and had been doing well but presented recently with worsening fatigue, shortness of breath and some chest pain and was found to be in recurrent atrial fibrillation. He was brought in to initiate Tikosyn.  The patient was brought in and after appropriate laboratory and screening studies were done was initiated on Tikosyn. The QTC Karl [name] in his dosage to 250 mcg twice daily. He regularized his atrial rhythm to atrial flutter from atrial fibrillation. He underwent cardioversion with 100 W on 08/13/11 with reversion to sinus rhythm and noted some improvement in how he fell. He did develop some gallops on 7/11 and was treated with colchicine. He was given a single injection of Solu-Medrol prior to discharge to help with the acute gout.   He was given appropriate instruction regarding Tikosyn as well as drug interactions. He is to followup with me in one week and is to call if there are recurrent problems. Discharge Exam: Blood pressure 100/55, pulse 62, temperature 96.9 F (36.1 C), temperature source Oral, resp. rate 16, height  5\' 11"  (1.803 m), weight 95.8 kg (211 lb 3.2 oz), SpO2 95.00%.   Normal sinus rhythm on telemetry, lungs clear, no S3  Labs: CBC:   Lab Results  Component Value Date   WBC 9.6 08/10/2011   HGB 12.5* 08/10/2011   HCT 36.9* 08/10/2011   MCV 95.6 08/10/2011   PLT 174 08/10/2011   CMP:  Lab 08/13/11 0535 08/10/11 1211  NA 138 --  K 3.8 --  CL 100 --  CO2 27 --  BUN 19 --  CREATININE 1.15 --  CALCIUM 9.6 --  PROT -- 7.4  BILITOT -- 0.4  ALKPHOS -- 93  ALT -- 24  AST -- 18  GLUCOSE 178* --   Lipid Panel     Component Value Date/Time   CHOL 122 09/26/2010 0420   TRIG 120 09/26/2010 0420   HDL 44 09/26/2010 0420   CHOLHDL 2.8 09/26/2010 0420   VLDL 24 09/26/2010 0420   LDLCALC 54 09/26/2010 0420    EKG: Initial EKG showed atrial fibrillation. EKG on discharge shows normal sinus rhythm with a QTC of 0.49.  Discharge Medications:  Karl [name], Karl [name]  Home Medication Instructions [id number]   Printed on:08/14/11 0841  Medication Information                    aspirin 81 MG tablet Take 81 mg by mouth daily.             carvedilol (COREG) 3.125 MG tablet Take 3.125 mg by mouth 2 (two) times daily with a meal.             Omega-3 Fatty Acids (FISH OIL) 1000 MG CAPS Take by mouth 2 (two) times daily.  simvastatin (ZOCOR) 40 MG tablet Take 40 mg by mouth at bedtime.             losartan (COZAAR) 100 MG tablet Take 100 mg by mouth daily.             Cyanocobalamin (VITAMIN B-12) 2500 MCG SUBL Place 1 tablet under the tongue daily.             PRADAXA 150 MG CAPS TAKE 1 CAPSULE BY MOUTH TWICE DAILY           furosemide (LASIX) 40 MG tablet Take 40 mg by mouth daily.           glimepiride (AMARYL) 1 MG tablet Take 1-2 mg by mouth 2 (two) times daily. Take 2 tablets in the morning and one tablet at night           nitroGLYCERIN (NITROSTAT) 0.4 MG SL tablet Place 0.4 mg under the tongue every 5 (five) minutes as needed. For chest pain           colchicine  (COLCRYS) 0.6 MG tablet Take 1 tablet (0.6 mg total) by mouth as needed.           dofetilide (TIKOSYN) 250 MCG capsule Take 1 capsule (250 mcg total) by mouth every 12 (twelve) hours.           potassium chloride SA (K-DUR,KLOR-CON) 20 MEQ tablet Take 1 tablet (20 mEq total) by mouth daily.           traMADol (ULTRAM) 50 MG tablet Take 1 tablet (50 mg total) by mouth every 6 (six) hours as needed.             Followup plans and appointments: Followup with Dr. Donnie [name] in one week. He was also instructed not to take additional medicines without being sure that the did not interact with Tikosyn.    Time spent with patient to include physician time: 45 minutes  Signed: W. Ashley [name]. MD [facility] 08/14/2011, 8:41 AM

## 2011-08-19 ENCOUNTER — Other Ambulatory Visit: Payer: Self-pay | Admitting: Dermatology

## 2011-09-30 ENCOUNTER — Encounter: Payer: Self-pay | Admitting: Internal Medicine

## 2011-10-27 ENCOUNTER — Other Ambulatory Visit: Payer: Self-pay | Admitting: Cardiology

## 2011-11-11 ENCOUNTER — Encounter: Payer: Self-pay | Admitting: Internal Medicine

## 2011-11-11 ENCOUNTER — Ambulatory Visit (INDEPENDENT_AMBULATORY_CARE_PROVIDER_SITE_OTHER): Payer: Medicare Other | Admitting: Internal Medicine

## 2011-11-11 VITALS — BP 110/52 | HR 68 | Ht 71.0 in | Wt 213.0 lb

## 2011-11-11 DIAGNOSIS — I5022 Chronic systolic (congestive) heart failure: Secondary | ICD-10-CM

## 2011-11-11 DIAGNOSIS — I255 Ischemic cardiomyopathy: Secondary | ICD-10-CM

## 2011-11-11 DIAGNOSIS — I4891 Unspecified atrial fibrillation: Secondary | ICD-10-CM

## 2011-11-11 DIAGNOSIS — I2589 Other forms of chronic ischemic heart disease: Secondary | ICD-10-CM

## 2011-11-11 DIAGNOSIS — Z95810 Presence of automatic (implantable) cardiac defibrillator: Secondary | ICD-10-CM

## 2011-11-11 LAB — ICD DEVICE OBSERVATION
CHARGE TIME: 12.8 s
DEV-0020ICD: NEGATIVE
RV LEAD AMPLITUDE: 11.9 mv
RV LEAD IMPEDENCE ICD: 475 Ohm
TOT-0007: 2
TOT-0008: 0
TOT-0010: 6
TZON-0003SLOWVT: 360 ms
TZON-0004SLOWVT: 35

## 2011-11-11 NOTE — Patient Instructions (Signed)
Your physician wants you to follow-up in: 12 months with Dr. Taylor. You will receive a reminder letter in the mail two months in advance. If you don't receive a letter, please call our office to schedule the follow-up appointment.    

## 2011-11-11 NOTE — Assessment & Plan Note (Signed)
His St. Jude single chamber defibrillator appears to be working normally. We'll plan to recheck in several months.

## 2011-11-11 NOTE — Assessment & Plan Note (Signed)
His current symptoms are class II. He will continue his current medical therapy and maintain a low-sodium diet.

## 2011-11-11 NOTE — Progress Notes (Signed)
HPI Karl [name] returns today for ICD followup. He is a very pleasant 76 year old man with a history of atrial fibrillation, and an ischemic cardiomyopathy, status post bypass surgery in the past. He has chronic systolic heart failure which is well compensated and class II. He was placed on dofetilide for his atrial fibrillation. He does not feel palpitations or any symptomatic atrial fib. He denies chest pain or shortness of breath or syncope. No peripheral edema. Allergies  Allergen Reactions  . Rosiglitazone-Metformin     Unknown  . Amiodarone Other (See Comments)    Tremor      Current Outpatient Prescriptions  Medication Sig Dispense Refill  . aspirin 81 MG tablet Take 81 mg by mouth daily.        . carvedilol (COREG) 3.125 MG tablet Take 3.125 mg by mouth 2 (two) times daily with a meal.        . colchicine (COLCRYS) 0.6 MG tablet Take 1 tablet (0.6 mg total) by mouth as needed.      . Cyanocobalamin (VITAMIN B-12) 2500 MCG SUBL Place 1 tablet under the tongue daily.        . furosemide (LASIX) 40 MG tablet Take 40 mg by mouth daily.      Marland Kitchen glimepiride (AMARYL) 1 MG tablet Take 1-2 mg by mouth 2 (two) times daily. Take 2 tablets in the morning and one tablet at night      . losartan (COZAAR) 100 MG tablet Take 100 mg by mouth daily.        . nitroGLYCERIN (NITROSTAT) 0.4 MG SL tablet Place 0.4 mg under the tongue every 5 (five) minutes as needed. For chest pain      . Omega-3 Fatty Acids (FISH OIL) 1000 MG CAPS Take by mouth 2 (two) times daily.        . potassium chloride SA (K-DUR,KLOR-CON) 20 MEQ tablet Take 1 tablet (20 mEq total) by mouth daily.  30 tablet  12  . PRADAXA 150 MG CAPS TAKE 1 CAPSULE BY MOUTH TWICE DAILY  60 capsule  10  . simvastatin (ZOCOR) 40 MG tablet Take 40 mg by mouth at bedtime.        . traMADol (ULTRAM) 50 MG tablet Take 1 tablet (50 mg total) by mouth every 6 (six) hours as needed.  30 tablet    . dofetilide (TIKOSYN) 250 MCG capsule Take 1 capsule (250  mcg total) by mouth every 12 (twelve) hours.  60 capsule  12     Past Medical History  Diagnosis Date  . Coronary artery disease     status post CABG by Dr. Andrey Campanile in 1993 and status post percutaneous transluminal coronary angioplasty and stenting by Dr. Donnie Aho in 2002 and 2006  . Carotid artery occlusion     severe left internal carotid artery stenosis, asymptomatic status post carotid endarterectomy  . Hypertensive heart disease without CHF   . Carotid artery disease   . Hyperlipidemia   . ICD (implantable cardiac defibrillator) in place   . CHF (congestive heart failure)   . Myocardial infarction ~ 1992  . Anginal pain   . Pacemaker   . 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 H/O hiatal hernia     "gone after my bypass"  . Arthritis     "neck, back, knees"  . Ischemic cardiomyopathy   . Atrial fibrillation     ROS:   All systems reviewed and negative except as noted in the HPI.  Past Surgical History  Procedure Date  . Carotid endarterectomy 2009    left  . Cardiac defibrillator placement 05/2006    St Jude   . Eye surgery March 2013    Cataract Left eye  . Eye surgery 06/08/2011    Cataract Right eye  . Pr vein bypass graft,aorto-fem-pop   . Inguinal hernia repair 1970's    bilaterally  . Hemorrhoid surgery 1970's  . Coronary angioplasty with stent placement     "I've got 2" (08/10/11)  . Cardioversion 08/2010  . Insert / replace / remove pacemaker 05/2006    "got a defibrillator/pacemaker" (08/10/11)  . Coronary artery bypass graft 08/1991    CABG X5  . Cardioversion 08/13/2011    Procedure: CARDIOVERSION;  Surgeon: Othella Boyer, MD;  Location: [facility] OR;  Service: Cardiovascular;  Laterality: N/A;     Family History  Problem Relation Age of Onset  . Diabetes Father   . Heart disease Father     Heart Disease before age 50  . Heart attack Father      History   Social History  . Marital Status: Married    Spouse Name: N/A    Number  of Children: N/A  . Years of Education: N/A   Occupational History  . Not on file.   Social History Main Topics  . Smoking status: Former Smoker -- 1.0 packs/day for 26 years    Types: Cigarettes    Quit date: 10/20/1980  . Smokeless tobacco: Current User    Types: Chew   Comment: 08/10/11 "still use a little chew q now and then"  . Alcohol Use: 8.4 oz/week    14 Shots of liquor per week  . Drug Use: No  . Sexually Active: Not Currently   Other Topics Concern  . Not on file   Social History Narrative  . No narrative on file     BP 110/52  Pulse 68  Ht 5\' 11"  (1.803 m)  Wt 213 lb (96.616 kg)  BMI 29.71 kg/m2  Physical Exam:  Well appearing 76 year old man, NAD HEENT: Unremarkable Neck:  No JVD, no thyromegally Lungs:  Clear with no wheezes, rales, or rhonchi. HEART:  IRegular rate rhythm, no murmurs, no rubs, no clicks Abd:  soft, positive bowel sounds, no organomegally, no rebound, no guarding Ext:  2 plus pulses, no edema, no cyanosis, no clubbing Skin:  No rashes no nodules Neuro:  CN II through XII intact, motor grossly intact  DEVICE  Normal device function.  See PaceArt for details.   Assess/Plan:

## 2011-11-11 NOTE — Assessment & Plan Note (Signed)
He appears to be tolerating dofetilide very nicely. He has minimal if any symptomatic arrhythmia.

## 2011-12-25 ENCOUNTER — Ambulatory Visit
Admission: RE | Admit: 2011-12-25 | Discharge: 2011-12-25 | Disposition: A | Discharge status: Home / Self Care | Payer: Medicare Other | Source: Ambulatory Visit | Attending: Sports Medicine | Admitting: Sports Medicine

## 2011-12-25 ENCOUNTER — Other Ambulatory Visit: Payer: Self-pay | Admitting: Sports Medicine

## 2011-12-25 DIAGNOSIS — M545 Low back pain: Secondary | ICD-10-CM

## 2012-05-19 ENCOUNTER — Other Ambulatory Visit: Payer: Self-pay | Admitting: Internal Medicine

## 2012-06-14 ENCOUNTER — Other Ambulatory Visit: Payer: Self-pay | Admitting: Cardiology

## 2012-06-14 NOTE — H&P (Signed)
Karl [name]  Date of visit:  06/14/2012 DOB:  01-May-1935    Age:  77 yrs. Medical record number:  [id number]     Account number:  [id number] Primary Care Provider: Gwen Pounds ____________________________ CURRENT DIAGNOSES  1. Dyspnea  2. AICD in situ  3. CAD,Native  4. Atrial Fibrillation  5. Congestive Heart Failure Left  6. Long Term Use Anticoagulant  7. Cardiomyopathy Ischemic  8. CAD. Bypass graft  9. Hyperlipidemia  10. Hypertension-Essential (Benign)  11. MI-S/P Lateral  12. Diabetes Mellitus-NIDD  13. Stent Placement  14. Dyspnea ____________________________ ALLERGIES  amiodarone, Tremor and bradycardia  AvandaMet ____________________________ MEDICATIONS  1. Aspirin 81 mg Tablet, 1 p.o. daily  2. Pradaxa 150 mg Capsule, BID  3. Fish Oil Concentrate 1,000 mg Capsule, bid  4. Colcrys 0.6 mg Tablet, PRN  5. Vitamin B-12 2,500 mcg Tablet, Sublingual, 1 qd  6. furosemide 40 mg tablet, 1 p.o. daily  7. glimepiride 1 mg tablet, 2 qam  1 qpm  8. Tylenol 325 mg tablet, PRN  9. losartan 100 mg tablet, 1 p.o. daily  10. nitroglycerin 0.4 mg tablet, sublingual, PRN  11. Tikosyn 250 mcg capsule, BID  12. Klor-Con M20 20 mEq tablet,ER particles/crystals, 1 p.o. daily  13. carvedilol 6.25 mg tablet, BID  14. simvastatin 40 mg tablet, 1 p.o. daily ____________________________ HISTORY OF PRESENT ILLNESS  Patient seen early for evaluation of dyspnea and exercise intolerance. For the past month he has noted worsening exercise tolerance, a cough and just difficulty doing normal activities. He has not been able to hunt and becomes fatigued with most any level of activity. He thought that it first he may have had an upper respiratory infection but things have not been going well and he went to see his primary care physician who obtained a BNP level that was only mildly elevated. He has not really had any edema he denies PND or orthopnea. He has bypasses are almost 77 years old and had  similar type symptoms prior to his previous vein graft stenosis in the past. He has really never had angina. ____________________________ PAST HISTORY  Past Medical Illnesses:  hypertension, DM-non-insulin dependent, hyperlipidemia, obesity;  Cardiovascular Illnesses:  CAD, cardiomyopathy(ischemic), arrhythmia-PVCs, carotid artery disease, atrial fibrillation;  Surgical Procedures:  CABG w LIMA to LAD-dx, SVG to int-OM, SVG to RCA 08/22/91 Dr. Andrey Campanile, hemorrhoidectomy, inguinal herniorrhaphy-rt, inguinal herniorrhaphy-left, carotid endarterectomy-left 2009;  NYHA Classification:  II;  Cardiology Procedures-Invasive:  Stent circumflex vein graft 2000, Vision stent to distal SVG to circ 10/06, cardiac cath (left) September 2008, St. Jude AICD 4/08 Dr. Ladona Ridgel, cardioversion July 2012, cardiac cath (left) August 2012, cardioversion July 2013;  Cardiology Procedures-Noninvasive:  lexiscan October 2010, echocardiogram August 2011, event monitor May 2012, echocardiogram June 2012, echocardiogram July 2013;  Cardiac Cath Results:  50% stenosis distal Left main, 99% stenosis proximal LAD, occluded CFX RCA, occluded RCA SVG, widely patent LAD Diag 1 LIMA graft, patent proximal and distal stent sites in graft to marginal branch with 40% stenosis between the stents;  Peripheral Vascular Procedures:  carotid doppler March 2012;  LVEF of 35% documented via echocardiogram on 08/05/2011,  CHADS Score:  3,  CHA2DS2-VASC Score:  4 ____________________________ CARDIO-PULMONARY TEST DATES EKG Date:  06/14/2012;   Cardiac Cath Date:  09/29/2010;  CABG: 08/22/1991;  Stent Placement Date: 11/11/2004;  Holter/Event Monitor Date: 07/02/2010;  Nuclear Study Date:  11/29/2008;  Echocardiography Date: 08/05/2011;  Chest Xray Date: 07/31/2011;   ____________________________ FAMILY HISTORY Father - age 47,  died of myocardial infarction; Mother - age 4,  deceased; Brother 1 - age 54,  died of heart disease; Brother 2 -  alive and well;  Brother 3 -  alive and well; Sister 1 -  alive and well and cancer-bladder;  ____________________________ SOCIAL HISTORY Alcohol Use:  does not use alcohol;  Smoking:  used to smoke but quit Prior to 1980;  Diet:  regular diet;  Lifestyle:  hunts and married;  Exercise:  no regular exercise;  Occupation:  retired and works part time at [employer];  Residence:  lives with wife;  Job Description:  worked at [employer];   ____________________________ REVIEW OF SYSTEMS General:  malaise and fatigue  Integumentary:recent removal of skin cancer Eyes: wears eye glasses/contact lenses Ears, Nose, Throat, Mouth:  denies any hearing loss, epistaxis, hoarseness or difficulty speaking. Respiratory: dyspnea with exertion Cardiovascular:  please review HPI Abdominal: denies dyspepsia, GI bleeding, constipation, or diarrhea Genitourinary-Male: frequency  Musculoskeletal:  chronic low back pain, arthritis of the left knee, arthritis of the hips Neurological:  vertigo Psychiatric:  denies depession or anxiety.  ____________________________ PHYSICAL EXAMINATION VITAL SIGNS  Blood Pressure:  130/70 Sitting, Right arm, regular cuff  , 126/72 Standing, Right arm and regular cuff   Pulse:  72/min. Weight:  212.00 lbs. Height:  71"BMI: 29  Constitutional:  pleasant white male in no acute distress Skin:  solar keratosis face Head:  normocephalic, balding male hair pattern Eyes:  EOMS Intact, PERRLA, C and S clear, Funduscopic exam not done. ENT:  full mouth dentures present, ears, nose and throat unremarkable Neck:  left carotid bruit present, no JVD, no masses, non-tender Chest:  clear to auscultation and percussion, healed median sternotomy scar Cardiac:  regular rhythm, normal S1 and S2, no S3 or S4, no murmurs,clicks, or rub heard Abdomen:  abdomen soft,non-tender, no masses, no hepatospenomegaly, or aneurysm noted Peripheral Pulses:  the femoral,dorsalis pedis, and posterior tibial pulses are full and equal  bilaterally with no bruits auscultated. Extremities & Back:  well healed saphenous vein donor site RLE, well healed saphenous vein donor site LLE, no edema present Neurological:  no gross motor or sensory deficits noted, affect appropriate, oriented x3. ____________________________ MOST RECENT LIPID PANEL 03/24/12  CHOL TOTL 135 mg/dl, LDL 72 calc, HDL 28 mg/dl, TRIGLYCER 161 mg/dl and CHOL/HDL 4.8 (Calc) ____________________________ IMPRESSIONS/PLAN   1. Worsening exercise tolerance and dyspnea that is likely an ischemic equivalent 2. Coronary artery disease with previous bypass grafting and previous saphenous vein graft stenting in 2000 and 2007 3. Diabetes mellitus 4. Hypertension 5. History of atrial fibrillation currently in sinus rhythm on Tikosyn 6. Long-term anticoagulation with Pradaxa  Recommendations:  His EKG shows normal sinus rhythm but he has new ST depression in the lateral leads that was not present on an EKG previously. I'm concerned that he may have developed interval saphenous vein graft disease as his presentation is similar to when he had this problem before. Cardiac catheterization was discussed with the patient including risks of myocardial infarction, death, stroke, bleeding, arrhythmia, dye allergy, or renal insufficiency. He understands and is willing to proceed. Possibility of percutaneous intervention at the same setting was also discussed with the patient including risks.  He is to discontinue his Pradaxa at the present time and we will plan on doing the catheterization in 2 days. He should continue aspirin. ___________________________ TODAYS ORDERS  1. 2D, color flow, doppler: First Available  2. Comprehensive Metabolic Panel: Today  3. Complete Blood Count: Today  4. Draw PT/INR: Today  5. PTT: Today  6. 12 Lead EKG: Today                       ____________________________ Cardiology Physician:  Darden Palmer MD [facility]

## 2012-06-15 ENCOUNTER — Encounter (HOSPITAL_COMMUNITY): Payer: Self-pay | Admitting: Pharmacy Technician

## 2012-06-16 ENCOUNTER — Encounter (HOSPITAL_COMMUNITY)
Admission: RE | Disposition: A | Discharge status: Home / Self Care | Payer: Self-pay | Source: Ambulatory Visit | Attending: Cardiology

## 2012-06-16 ENCOUNTER — Ambulatory Visit (HOSPITAL_COMMUNITY)
Admission: RE | Admit: 2012-06-16 | Discharge: 2012-06-16 | Disposition: A | Discharge status: Home / Self Care | Payer: Medicare Other | Source: Ambulatory Visit | Attending: Cardiology | Admitting: Cardiology

## 2012-06-16 DIAGNOSIS — I2589 Other forms of chronic ischemic heart disease: Secondary | ICD-10-CM | POA: Insufficient documentation

## 2012-06-16 DIAGNOSIS — I25810 Atherosclerosis of coronary artery bypass graft(s) without angina pectoris: Secondary | ICD-10-CM | POA: Insufficient documentation

## 2012-06-16 DIAGNOSIS — I252 Old myocardial infarction: Secondary | ICD-10-CM | POA: Insufficient documentation

## 2012-06-16 DIAGNOSIS — Z9861 Coronary angioplasty status: Secondary | ICD-10-CM | POA: Insufficient documentation

## 2012-06-16 DIAGNOSIS — E669 Obesity, unspecified: Secondary | ICD-10-CM | POA: Insufficient documentation

## 2012-06-16 DIAGNOSIS — Z7982 Long term (current) use of aspirin: Secondary | ICD-10-CM | POA: Insufficient documentation

## 2012-06-16 DIAGNOSIS — I2510 Atherosclerotic heart disease of native coronary artery without angina pectoris: Secondary | ICD-10-CM | POA: Insufficient documentation

## 2012-06-16 DIAGNOSIS — Z79899 Other long term (current) drug therapy: Secondary | ICD-10-CM | POA: Insufficient documentation

## 2012-06-16 DIAGNOSIS — I6529 Occlusion and stenosis of unspecified carotid artery: Secondary | ICD-10-CM | POA: Insufficient documentation

## 2012-06-16 DIAGNOSIS — I4949 Other premature depolarization: Secondary | ICD-10-CM | POA: Insufficient documentation

## 2012-06-16 DIAGNOSIS — Z7901 Long term (current) use of anticoagulants: Secondary | ICD-10-CM | POA: Insufficient documentation

## 2012-06-16 DIAGNOSIS — E119 Type 2 diabetes mellitus without complications: Secondary | ICD-10-CM | POA: Insufficient documentation

## 2012-06-16 DIAGNOSIS — I10 Essential (primary) hypertension: Secondary | ICD-10-CM | POA: Insufficient documentation

## 2012-06-16 DIAGNOSIS — Z95810 Presence of automatic (implantable) cardiac defibrillator: Secondary | ICD-10-CM | POA: Insufficient documentation

## 2012-06-16 DIAGNOSIS — I4891 Unspecified atrial fibrillation: Secondary | ICD-10-CM | POA: Insufficient documentation

## 2012-06-16 DIAGNOSIS — Z6828 Body mass index (BMI) 28.0-28.9, adult: Secondary | ICD-10-CM | POA: Insufficient documentation

## 2012-06-16 DIAGNOSIS — E785 Hyperlipidemia, unspecified: Secondary | ICD-10-CM | POA: Insufficient documentation

## 2012-06-16 DIAGNOSIS — I509 Heart failure, unspecified: Secondary | ICD-10-CM | POA: Insufficient documentation

## 2012-06-16 HISTORY — PX: LEFT HEART CATHETERIZATION WITH CORONARY/GRAFT ANGIOGRAM: SHX5450

## 2012-06-16 LAB — GLUCOSE, CAPILLARY
Glucose-Capillary: 125 mg/dL — ABNORMAL HIGH (ref 70–99)
Glucose-Capillary: 103 mg/dL — ABNORMAL HIGH (ref 70–99)

## 2012-06-16 SURGERY — LEFT HEART CATHETERIZATION WITH CORONARY/GRAFT ANGIOGRAM

## 2012-06-16 MED ORDER — DIAZEPAM 5 MG PO TABS
ORAL_TABLET | ORAL | Status: AC
  Filled 2012-06-16: qty 2

## 2012-06-16 MED ORDER — SODIUM CHLORIDE 0.9 % IV SOLN
INTRAVENOUS | Status: DC
  Administered 2012-06-16: 06:00:00 via INTRAVENOUS

## 2012-06-16 MED ORDER — HEPARIN (PORCINE) IN NACL 2-0.9 UNIT/ML-% IJ SOLN
INTRAMUSCULAR | Status: AC
  Filled 2012-06-16: qty 1000

## 2012-06-16 MED ORDER — DIAZEPAM 5 MG PO TABS
10.0000 mg | ORAL_TABLET | ORAL | Status: AC
  Administered 2012-06-16: 10 mg via ORAL

## 2012-06-16 MED ORDER — SODIUM CHLORIDE 0.9 % IV SOLN: 1.0000 mL/kg/h | INTRAVENOUS | Status: AC

## 2012-06-16 MED ORDER — MIDAZOLAM HCL 2 MG/2ML IJ SOLN
INTRAMUSCULAR | Status: AC
  Filled 2012-06-16: qty 2

## 2012-06-16 MED ORDER — ACETAMINOPHEN 325 MG PO TABS: 650.0000 mg | ORAL_TABLET | ORAL | Status: DC | PRN

## 2012-06-16 MED ORDER — ASPIRIN 81 MG PO CHEW
324.0000 mg | CHEWABLE_TABLET | ORAL | Status: AC
  Administered 2012-06-16: 324 mg via ORAL

## 2012-06-16 MED ORDER — LIDOCAINE HCL (PF) 1 % IJ SOLN
INTRAMUSCULAR | Status: AC
  Filled 2012-06-16: qty 30

## 2012-06-16 MED ORDER — ASPIRIN 81 MG PO CHEW
CHEWABLE_TABLET | ORAL | Status: AC
  Filled 2012-06-16: qty 4

## 2012-06-16 NOTE — CV Procedure (Signed)
CARDIAC CATHETERIZATION REPORT   TOREY REINARD  DOB: 01-06-1936  MRN: [id number]  Today's date: 06/16/2012   PROCEDURE:  Left heart catheterization with selective coronary angiography, bypass graft angiograms, and left ventriculogram.  INDICATIONS:   Progressive dyspnea patient with previous bypass grafting previous stenting of the vein graft.The risks, benefits, and details of the procedure were explained to the patient.  The patient verbalized understanding and wanted to proceed.  Informed written consent was obtained.  PROCEDURE TECHNIQUE:  After Xylocaine anesthesia a 24F sheath was placed in the right femoral artery with a single anterior needle wall stick.   Left coronary angiography was done using a Judkins L4 guide catheter.  Right coronary angiography was done using a Judkins R4 guide catheter.  The right graft was selected using the JR 4 catheter. And Amplantz left 1 catheter was used to select the circumflex graft.The LIMA was selected with  the JR 4 right catheter. Left ventriculography was done using a pigtail catheter. The patient tolerated the procedure well and was returned to the holding area for sheath removal.  CONTRAST:   85 cc.  COMPLICATIONS:  None.    HEMODYNAMICS:   Aortic postcontrast 130/54 LV postcontrast 130/7-13.  There was no gradient between the left ventricle and aorta.    ANGIOGRAPHIC DATA:    CORONARY ARTERIES:   Arise and distribute normally.  Right dominant. Moderate coronary calcification is noted.  Left main coronary artery:  calcified with moderate diffuse narrowing.  Left anterior descending:  calcified with severe stenosis prior to septal perforator branch. There is a severe stenosis with bidirectional flow into the LAD that fills by the mammary graft.  Circumflex coronary artery:  occluded.  Right coronary artery:  heavily calcified and occluded proximally.  Saphenous vein graft to the right coronary  artery: Occluded at its ostium. The right coronary artery fills by collaterals through the mammary graft as well as the obtuse marginal graft to the circumflex system.  Saphenous vein graft to the circumflex: Widely patent. The previously placed stents are both patent. There is mild 30-40% in-stent restenosis of the more distal stent but there is good flow distally. Collateral filling is seen to the right coronary artery.  Internal mammary graft to LAD: Is widely patent with a patent distal anastomotic site.  LEFT VENTRICULOGRAM:  Performed in the 30 RAO projection.  The aortic and mitral valves are normal. An AICD coil is noted.  Left ventricle is mildly dilated. There is severe inferior wall hypokinesis noted with an estimated ejection fraction of 30-35%.    IMPRESSIONS:  1.  Severe native three-vessel coronary artery disease with occlusion of the native coronary arteries 2.  Widely patent saphenous vein graft sequentially to the first and second marginal branch with widely patent stents 3. Widely patent mammary graft to the LAD 4. Normal left ventricle.Marland Kitchen  RECOMMENDATION:  Evaluate for other causes of dyspnea and reduced exercise capacity.  Darden Palmer MD [facility] Cardiology

## 2012-06-16 NOTE — Interval H&P Note (Signed)
History and Physical Interval Note:  06/16/2012 7:38 AM    Patient seen and examined.  No interval change in history and exam since last note.  Stable for procedure.  Darden Palmer. MD [facility]  06/16/2012

## 2012-06-16 NOTE — H&P (View-Only) (Signed)
Karl [name], Karl [name]  Date of visit:  06/14/2012 DOB:  11/26/1935    Age:  76 yrs. Medical record number:  [id number]     Account number:  [id number] Primary Care Provider: RUSSO, JOHN M ____________________________ CURRENT DIAGNOSES  1. Dyspnea  2. AICD in situ  3. CAD,Native  4. Atrial Fibrillation  5. Congestive Heart Failure Left  6. Long Term Use Anticoagulant  7. Cardiomyopathy Ischemic  8. CAD. Bypass graft  9. Hyperlipidemia  10. Hypertension-Essential (Benign)  11. MI-S/P Lateral  12. Diabetes Mellitus-NIDD  13. Stent Placement  14. Dyspnea ____________________________ ALLERGIES  amiodarone, Tremor and bradycardia  AvandaMet ____________________________ MEDICATIONS  1. Aspirin 81 mg Tablet, 1 p.o. daily  2. Pradaxa 150 mg Capsule, BID  3. Fish Oil Concentrate 1,000 mg Capsule, bid  4. Colcrys 0.6 mg Tablet, PRN  5. Vitamin B-12 2,500 mcg Tablet, Sublingual, 1 qd  6. furosemide 40 mg tablet, 1 p.o. daily  7. glimepiride 1 mg tablet, 2 qam  1 qpm  8. Tylenol 325 mg tablet, PRN  9. losartan 100 mg tablet, 1 p.o. daily  10. nitroglycerin 0.4 mg tablet, sublingual, PRN  11. Tikosyn 250 mcg capsule, BID  12. Klor-Con M20 20 mEq tablet,ER particles/crystals, 1 p.o. daily  13. carvedilol 6.25 mg tablet, BID  14. simvastatin 40 mg tablet, 1 p.o. daily ____________________________ HISTORY OF PRESENT ILLNESS  Patient seen early for evaluation of dyspnea and exercise intolerance. For the past month he has noted worsening exercise tolerance, a cough and just difficulty doing normal activities. He has not been able to hunt and becomes fatigued with most any level of activity. He thought that it first he may have had an upper respiratory infection but things have not been going well and he went to see his primary care physician who obtained a BNP level that was only mildly elevated. He has not really had any edema he denies PND or orthopnea. He has bypasses are almost 77 years old and had  similar type symptoms prior to his previous vein graft stenosis in the past. He has really never had angina. ____________________________ PAST HISTORY  Past Medical Illnesses:  hypertension, DM-non-insulin dependent, hyperlipidemia, obesity;  Cardiovascular Illnesses:  CAD, cardiomyopathy(ischemic), arrhythmia-PVCs, carotid artery disease, atrial fibrillation;  Surgical Procedures:  CABG w LIMA to LAD-dx, SVG to int-OM, SVG to RCA 08/22/91 Dr. Wilson, hemorrhoidectomy, inguinal herniorrhaphy-rt, inguinal herniorrhaphy-left, carotid endarterectomy-left 2009;  NYHA Classification:  II;  Cardiology Procedures-Invasive:  Stent circumflex vein graft 2000, Vision stent to distal SVG to circ 10/06, cardiac cath (left) September 2008, [employer] AICD 4/08 Dr. Taylor, cardioversion July 2012, cardiac cath (left) August 2012, cardioversion July 2013;  Cardiology Procedures-Noninvasive:  lexiscan October 2010, echocardiogram August 2011, event monitor May 2012, echocardiogram June 2012, echocardiogram July 2013;  Cardiac Cath Results:  50% stenosis distal Left main, 99% stenosis proximal LAD, occluded CFX RCA, occluded RCA SVG, widely patent LAD Diag 1 LIMA graft, patent proximal and distal stent sites in graft to marginal branch with 40% stenosis between the stents;  Peripheral Vascular Procedures:  carotid doppler March 2012;  LVEF of 35% documented via echocardiogram on 08/05/2011,  CHADS Score:  3,  CHA2DS2-VASC Score:  4 ____________________________ CARDIO-PULMONARY TEST DATES EKG Date:  06/14/2012;   Cardiac Cath Date:  09/29/2010;  CABG: 08/22/1991;  Stent Placement Date: 11/11/2004;  Holter/Event Monitor Date: 07/02/2010;  Nuclear Study Date:  11/29/2008;  Echocardiography Date: 08/05/2011;  Chest Xray Date: 07/31/2011;   ____________________________ FAMILY HISTORY Father - age 54,    died of myocardial infarction; Mother - age 87,  deceased; Brother 1 - age 65,  died of heart disease; Brother 2 -  alive and well;  Brother 3 -  alive and well; Sister 1 -  alive and well and cancer-bladder;  ____________________________ SOCIAL HISTORY Alcohol Use:  does not use alcohol;  Smoking:  used to smoke but quit Prior to 1980;  Diet:  regular diet;  Lifestyle:  hunts and married;  Exercise:  no regular exercise;  Occupation:  retired and works part time at [employer];  Residence:  lives with wife;  Job Description:  worked at [employer];   ____________________________ REVIEW OF SYSTEMS General:  malaise and fatigue  Integumentary:recent removal of skin cancer Eyes: wears eye glasses/contact lenses Ears, Nose, Throat, Mouth:  denies any hearing loss, epistaxis, hoarseness or difficulty speaking. Respiratory: dyspnea with exertion Cardiovascular:  please review HPI Abdominal: denies dyspepsia, GI bleeding, constipation, or diarrhea Genitourinary-Male: frequency  Musculoskeletal:  chronic low back pain, arthritis of the left knee, arthritis of the hips Neurological:  vertigo Psychiatric:  denies depession or anxiety.  ____________________________ PHYSICAL EXAMINATION VITAL SIGNS  Blood Pressure:  130/70 Sitting, Right arm, regular cuff  , 126/72 Standing, Right arm and regular cuff   Pulse:  72/min. Weight:  212.00 lbs. Height:  71"BMI: 29  Constitutional:  pleasant white male in no acute distress Skin:  solar keratosis face Head:  normocephalic, balding male hair pattern Eyes:  EOMS Intact, PERRLA, C and S clear, Funduscopic exam not done. ENT:  full mouth dentures present, ears, nose and throat unremarkable Neck:  left carotid bruit present, no JVD, no masses, non-tender Chest:  clear to auscultation and percussion, healed median sternotomy scar Cardiac:  regular rhythm, normal S1 and S2, no S3 or S4, no murmurs,clicks, or rub heard Abdomen:  abdomen soft,non-tender, no masses, no hepatospenomegaly, or aneurysm noted Peripheral Pulses:  the femoral,dorsalis pedis, and posterior tibial pulses are full and equal  bilaterally with no bruits auscultated. Extremities & Back:  well healed saphenous vein donor site RLE, well healed saphenous vein donor site LLE, no edema present Neurological:  no gross motor or sensory deficits noted, affect appropriate, oriented x3. ____________________________ MOST RECENT LIPID PANEL 03/24/12  CHOL TOTL 135 mg/dl, LDL 72 calc, HDL 28 mg/dl, TRIGLYCER 173 mg/dl and CHOL/HDL 4.8 (Calc) ____________________________ IMPRESSIONS/PLAN   1. Worsening exercise tolerance and dyspnea that is likely an ischemic equivalent 2. Coronary artery disease with previous bypass grafting and previous saphenous vein graft stenting in 2000 and 2007 3. Diabetes mellitus 4. Hypertension 5. History of atrial fibrillation currently in sinus rhythm on Tikosyn 6. Long-term anticoagulation with Pradaxa  Recommendations:  His EKG shows normal sinus rhythm but he has new ST depression in the lateral leads that was not present on an EKG previously. I'm concerned that he may have developed interval saphenous vein graft disease as his presentation is similar to when he had this problem before. Cardiac catheterization was discussed with the patient including risks of myocardial infarction, death, stroke, bleeding, arrhythmia, dye allergy, or renal insufficiency. He understands and is willing to proceed. Possibility of percutaneous intervention at the same setting was also discussed with the patient including risks.  He is to discontinue his Pradaxa at the present time and we will plan on doing the catheterization in 2 days. He should continue aspirin. ___________________________ TODAYS ORDERS  1. 2D, color flow, doppler: First Available  2. Comprehensive Metabolic Panel: Today  3. Complete Blood Count: Today    4. Draw PT/INR: Today  5. PTT: Today  6. 12 Lead EKG: Today                       ____________________________ Cardiology Physician:  W. Spencer Latania Bascomb, Jr. MD FACC     

## 2012-06-22 ENCOUNTER — Other Ambulatory Visit: Payer: Self-pay | Admitting: *Deleted

## 2012-06-22 ENCOUNTER — Other Ambulatory Visit (INDEPENDENT_AMBULATORY_CARE_PROVIDER_SITE_OTHER): Payer: Medicare Other | Admitting: *Deleted

## 2012-06-22 ENCOUNTER — Ambulatory Visit: Payer: Medicare Other | Admitting: Neurosurgery

## 2012-06-22 DIAGNOSIS — Z48812 Encounter for surgical aftercare following surgery on the circulatory system: Secondary | ICD-10-CM

## 2012-06-22 DIAGNOSIS — I6529 Occlusion and stenosis of unspecified carotid artery: Secondary | ICD-10-CM

## 2012-06-23 ENCOUNTER — Encounter: Payer: Self-pay | Admitting: Vascular Surgery

## 2012-06-24 ENCOUNTER — Other Ambulatory Visit: Payer: Self-pay | Admitting: Internal Medicine

## 2012-06-24 DIAGNOSIS — R05 Cough: Secondary | ICD-10-CM

## 2012-06-24 DIAGNOSIS — R0989 Other specified symptoms and signs involving the circulatory and respiratory systems: Secondary | ICD-10-CM

## 2012-06-29 ENCOUNTER — Ambulatory Visit
Admission: RE | Admit: 2012-06-29 | Discharge: 2012-06-29 | Disposition: A | Discharge status: Home / Self Care | Payer: Medicare Other | Source: Ambulatory Visit | Attending: Internal Medicine | Admitting: Internal Medicine

## 2012-06-29 DIAGNOSIS — R0989 Other specified symptoms and signs involving the circulatory and respiratory systems: Secondary | ICD-10-CM

## 2012-06-29 DIAGNOSIS — R05 Cough: Secondary | ICD-10-CM

## 2012-06-29 MED ORDER — IOHEXOL 350 MG/ML SOLN
125.0000 mL | Freq: Once | INTRAVENOUS | Status: AC | PRN
  Administered 2012-06-29: 125 mL via INTRAVENOUS

## 2012-07-01 ENCOUNTER — Other Ambulatory Visit (HOSPITAL_COMMUNITY): Payer: Self-pay | Admitting: Internal Medicine

## 2012-07-01 DIAGNOSIS — R0600 Dyspnea, unspecified: Secondary | ICD-10-CM

## 2012-07-06 ENCOUNTER — Ambulatory Visit (HOSPITAL_COMMUNITY)
Admission: RE | Admit: 2012-07-06 | Discharge: 2012-07-06 | Disposition: A | Discharge status: Home / Self Care | Payer: Medicare Other | Source: Ambulatory Visit | Attending: Internal Medicine | Admitting: Internal Medicine

## 2012-07-06 DIAGNOSIS — R0609 Other forms of dyspnea: Secondary | ICD-10-CM | POA: Insufficient documentation

## 2012-07-06 DIAGNOSIS — R0989 Other specified symptoms and signs involving the circulatory and respiratory systems: Secondary | ICD-10-CM | POA: Insufficient documentation

## 2012-07-06 MED ORDER — ALBUTEROL SULFATE (5 MG/ML) 0.5% IN NEBU
2.5000 mg | INHALATION_SOLUTION | Freq: Once | RESPIRATORY_TRACT | Status: AC
  Administered 2012-07-06: 2.5 mg via RESPIRATORY_TRACT

## 2012-10-05 ENCOUNTER — Other Ambulatory Visit: Payer: Self-pay | Admitting: Sports Medicine

## 2012-10-05 DIAGNOSIS — M545 Low back pain: Secondary | ICD-10-CM

## 2012-10-06 ENCOUNTER — Ambulatory Visit
Admission: RE | Admit: 2012-10-06 | Discharge: 2012-10-06 | Disposition: A | Discharge status: Home / Self Care | Payer: Medicare Other | Source: Ambulatory Visit | Attending: Sports Medicine | Admitting: Sports Medicine

## 2012-10-06 DIAGNOSIS — M545 Low back pain: Secondary | ICD-10-CM

## 2012-10-24 ENCOUNTER — Other Ambulatory Visit (INDEPENDENT_AMBULATORY_CARE_PROVIDER_SITE_OTHER): Payer: Medicare Other

## 2012-10-24 ENCOUNTER — Encounter: Payer: Self-pay | Admitting: Pulmonary Disease

## 2012-10-24 ENCOUNTER — Ambulatory Visit (INDEPENDENT_AMBULATORY_CARE_PROVIDER_SITE_OTHER): Payer: Medicare Other | Admitting: Pulmonary Disease

## 2012-10-24 VITALS — BP 136/60 | HR 60 | Temp 97.9°F | Ht 71.0 in | Wt 216.0 lb

## 2012-10-24 DIAGNOSIS — J849 Interstitial pulmonary disease, unspecified: Secondary | ICD-10-CM

## 2012-10-24 DIAGNOSIS — R0602 Shortness of breath: Secondary | ICD-10-CM

## 2012-10-24 DIAGNOSIS — J8410 Pulmonary fibrosis, unspecified: Secondary | ICD-10-CM

## 2012-10-24 DIAGNOSIS — I2510 Atherosclerotic heart disease of native coronary artery without angina pectoris: Secondary | ICD-10-CM

## 2012-10-24 HISTORY — DX: Shortness of breath: R06.02

## 2012-10-24 LAB — RHEUMATOID FACTOR: Rhuematoid fact SerPl-aCnc: <10 IU/mL (ref <=14)

## 2012-10-24 NOTE — Patient Instructions (Signed)
Take the spiriva every day no matter how you feel, let us know if it is helping and we will send in a prescription We will call you when the labs are back We will see you back in November after another CT scan

## 2012-10-24 NOTE — Assessment & Plan Note (Signed)
In addition to Karl [name]'s cardiac problems (chronic systolic heart failure and atrial fibrillation) he does appear to have significant pulmonary pathology. Though he did not have obstruction on his pulmonary function testing he does have emphysema on his CT chest which also showed a nonspecific interstitial fibrotic pattern. Unfortunately, there are no prior CT studies for further comparison. In his age group idiopathic pulmonary fibrosis would be the most common etiology, however like any patient he needs a workup to look for other possible causes. There is nothing on history which is strongly suggestive of an alternate diagnosis.  At this point, we need to focus on further workup and I will try him on empiric treatment with a long acting bronchodilator to see if that gives him symptomatic relief.  Plan: -Repeat full pulmonary function testing in November -Repeat CT chest in November ILD protocol -6 minute walk -Trial of Spiriva, samples provided today -Blood work sent for serology for interstitial lung disease.

## 2012-10-24 NOTE — Progress Notes (Signed)
Subjective:    Patient ID: Karl [name], male    DOB: December 03, 1935, 77 y.o.   MRN: [id number]  HPI  This is a very pleasant 77 year old male who comes to our clinic today for evaluation of shortness of breath. He has a past medical history significant for atrial fibrillation, coronary artery disease and ischemic cardiomyopathy. He has had an ICD placed in the past and this is managed by [facility] cardiology. He states that over the past several years he has "been going downhill" but it is not exactly clear when he started feeling shortness of breath. He says that in the last year he noticed that with minimal activity such as walking 50 and 60 yards he starts to feel tired. It doesn't happen all the time. He states it is primarily when he is getting out to exercise. He doesn't feel short of breath, just a lot of fatigue. His hobbies include hunting and fishing and he regularly goes to a [employer]. This involves a fair amount of walking through weeds and heavy grasses which does cause fatigue. On 2 separate occasions in the last 3 years he has had his ICD fire. He did not feel chest pain prior to this just some weakness in the ICD fired. This has not happened in the last 2 years. He has a cough which is occasional and he says doesn't bother him much. However he does note that he produces clear sputum every day. There's never any color or blood in it. In general his leg swelling has been normal.  He denies any rash, joint redness or swelling (outside his known osteoarthritis), trouble swallowing, dry mouth or unexplained fevers or chills or weight loss.  He had a normal childhood without respiratory illnesses but unfortunately smoke cigarettes at one pack a day for 20-25 years. He quit in the 1980s.  He was in a house that was built in the 1960s. He does not have a basement normal her mildew. He has a cat that stays outside. He previously worked in a [employer] business which involved occasional heavy  lifting. He does have arthritis which she attributes to this. He still works one day a week.    Past Medical History  Diagnosis Date  . Coronary artery disease     status post CABG by Dr. Andrey Campanile in 1993 and status post percutaneous transluminal coronary angioplasty and stenting by Dr. Donnie Aho in 2002 and 2006  . Carotid artery occlusion     severe left internal carotid artery stenosis, asymptomatic status post carotid endarterectomy  . Hypertensive heart disease without CHF   . Carotid artery disease   . Hyperlipidemia   . ICD (implantable cardiac defibrillator) in place   . CHF (congestive heart failure)   . Myocardial infarction ~ 1992  . Anginal pain   . Pacemaker   . 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 H/O hiatal hernia     "gone after my bypass"  . Arthritis     "neck, back, knees"  . Ischemic cardiomyopathy   . Atrial fibrillation      Family History  Problem Relation Age of Onset  . Diabetes Father   . Heart disease Father     Heart Disease before age 40  . Heart attack Father      History   Social History  . Marital Status: Married    Spouse Name: N/A    Number of Children: N/A  . Years of Education: N/A  Occupational History  . Not on file.   Social History Main Topics  . Smoking status: Former Smoker -- 1.00 packs/day for 26 years    Types: Cigarettes    Quit date: 10/20/1980  . Smokeless tobacco: Current User    Types: Chew     Comment: 08/10/11 "still use a little chew q now and then"  . Alcohol Use: 8.4 oz/week    14 Shots of liquor per week  . Drug Use: No  . Sexual Activity: Not Currently   Other Topics Concern  . Not on file   Social History Narrative  . No narrative on file     Allergies  Allergen Reactions  . Rosiglitazone-Metformin     Unknown  . Amiodarone Other (See Comments)    Tremor      Outpatient Prescriptions Prior to Visit  Medication Sig Dispense Refill  . aspirin 81 MG tablet Take 81 mg by  mouth daily.        . carvedilol (COREG) 3.125 MG tablet Take 6.25 mg by mouth 2 (two) times daily with a meal.       . colchicine (COLCRYS) 0.6 MG tablet Take 1 tablet (0.6 mg total) by mouth as needed.      . Cyanocobalamin (VITAMIN B-12) 2500 MCG SUBL Place 1 tablet under the tongue daily.        . dabigatran (PRADAXA) 150 MG CAPS Take 150 mg by mouth every 12 (twelve) hours.      . dofetilide (TIKOSYN) 250 MCG capsule Take 250 mcg by mouth 2 (two) times daily.      . furosemide (LASIX) 40 MG tablet Take 40 mg by mouth daily.      Marland Kitchen glimepiride (AMARYL) 1 MG tablet Take 1 mg by mouth 3 (three) times daily.       Marland Kitchen losartan (COZAAR) 100 MG tablet Take 100 mg by mouth daily.        . nitroGLYCERIN (NITROSTAT) 0.4 MG SL tablet Place 0.4 mg under the tongue every 5 (five) minutes as needed. For chest pain      . Omega-3 Fatty Acids (FISH OIL) 1000 MG CAPS Take by mouth 2 (two) times daily.        . potassium chloride SA (K-DUR,KLOR-CON) 20 MEQ tablet Take 1 tablet (20 mEq total) by mouth daily.  30 tablet  12  . simvastatin (ZOCOR) 40 MG tablet Take 40 mg by mouth at bedtime.         No facility-administered medications prior to visit.      Review of Systems  Constitutional: Negative for fever, chills, diaphoresis, activity change, appetite change, fatigue and unexpected weight change.  HENT: Negative for hearing loss, ear pain, nosebleeds, congestion, sore throat, facial swelling, rhinorrhea, sneezing, mouth sores, trouble swallowing, neck pain, neck stiffness, dental problem, voice change, postnasal drip, sinus pressure, tinnitus and ear discharge.   Eyes: Negative for photophobia, discharge, itching and visual disturbance.  Respiratory: Positive for cough and shortness of breath. Negative for apnea, choking, chest tightness, wheezing and stridor.   Cardiovascular: Negative for chest pain, palpitations and leg swelling.  Gastrointestinal: Negative for nausea, vomiting, abdominal pain,  constipation, blood in stool and abdominal distention.  Genitourinary: Negative for dysuria, urgency, frequency, hematuria, flank pain, decreased urine volume and difficulty urinating.  Musculoskeletal: Positive for joint swelling. Negative for myalgias, back pain, arthralgias and gait problem.  Skin: Negative for color change, pallor and rash.  Neurological: Negative for dizziness, tremors, seizures, syncope, speech difficulty, weakness,  light-headedness, numbness and headaches.  Hematological: Negative for adenopathy. Does not bruise/bleed easily.  Psychiatric/Behavioral: Negative for confusion, sleep disturbance and agitation. The patient is not nervous/anxious.        Objective:   Physical Exam  Filed Vitals:   10/24/12 0932  BP: 136/60  Pulse: 60  Temp: 97.9 F (36.6 C)  TempSrc: Oral  Height: 5\' 11"  (1.803 m)  Weight: 216 lb (97.977 kg)  SpO2: 95%   Gen: well appearing, no acute distress HEENT: NCAT, PERRL, EOMi, OP clear, neck supple without masses PULM: Few exp wheezes Bilaterally, normal percussion CV: RRR, no mgr, no JVD AB: BS+, soft, nontender, no hsm Ext: warm, no edema, no clubbing, no cyanosis Derm: no rash or skin breakdown Neuro: A&Ox4, CN II-XII intact, strength 5/5 in all 4 extremities  June 2014 full pulmonary function testing> ratio 86%, FEV1 2.91 L (96% predicted), no change with bronchodilator, total lung capacity 5.46 L (74% predicted) DLCO 15.09 (47% predicted) May 2014 CT chest>> there is no evidence of a pulmonary embolism, there is centrilobular emphysema seen throughout with significant paraseptal emphysema. There is also intralobular septal thickening and groundglass throughout the lungs and periphery in a fairly diffuse pattern. There no prior studies for further evaluation.     Assessment & Plan:   Shortness of breath In addition to Mr. Salvucci's cardiac problems (chronic systolic heart failure and atrial fibrillation) he does appear to have  significant pulmonary pathology. Though he did not have obstruction on his pulmonary function testing he does have emphysema on his CT chest which also showed a nonspecific interstitial fibrotic pattern. Unfortunately, there are no prior CT studies for further comparison. In his age group idiopathic pulmonary fibrosis would be the most common etiology, however like any patient he needs a workup to look for other possible causes. There is nothing on history which is strongly suggestive of an alternate diagnosis.  At this point, we need to focus on further workup and I will try him on empiric treatment with a long acting bronchodilator to see if that gives him symptomatic relief.  Plan: -Repeat full pulmonary function testing in November -Repeat CT chest in November ILD protocol -6 minute walk -Trial of Spiriva, samples provided today -Blood work sent for serology for interstitial lung disease.   Updated Medication List Outpatient Encounter Prescriptions as of 10/24/2012  Medication Sig Dispense Refill  . aspirin 81 MG tablet Take 81 mg by mouth daily.        . carvedilol (COREG) 3.125 MG tablet Take 6.25 mg by mouth 2 (two) times daily with a meal.       . colchicine (COLCRYS) 0.6 MG tablet Take 1 tablet (0.6 mg total) by mouth as needed.      . Cyanocobalamin (VITAMIN B-12) 2500 MCG SUBL Place 1 tablet under the tongue daily.        . dabigatran (PRADAXA) 150 MG CAPS Take 150 mg by mouth every 12 (twelve) hours.      . dofetilide (TIKOSYN) 250 MCG capsule Take 250 mcg by mouth 2 (two) times daily.      . furosemide (LASIX) 40 MG tablet Take 40 mg by mouth daily.      Marland Kitchen glimepiride (AMARYL) 1 MG tablet Take 1 mg by mouth 3 (three) times daily.       Marland Kitchen losartan (COZAAR) 100 MG tablet Take 100 mg by mouth daily.        . nitroGLYCERIN (NITROSTAT) 0.4 MG SL tablet Place 0.4 mg  under the tongue every 5 (five) minutes as needed. For chest pain      . Omega-3 Fatty Acids (FISH OIL) 1000 MG CAPS Take  by mouth 2 (two) times daily.        . potassium chloride SA (K-DUR,KLOR-CON) 20 MEQ tablet Take 1 tablet (20 mEq total) by mouth daily.  30 tablet  12  . simvastatin (ZOCOR) 40 MG tablet Take 40 mg by mouth at bedtime.         No facility-administered encounter medications on file as of 10/24/2012.

## 2012-10-25 ENCOUNTER — Other Ambulatory Visit: Payer: Self-pay | Admitting: Pulmonary Disease

## 2012-10-25 DIAGNOSIS — M069 Rheumatoid arthritis, unspecified: Secondary | ICD-10-CM

## 2012-10-25 LAB — CENTROMERE ANTIBODIES: Centromere Ab Screen: 6 AU/mL (ref <30)

## 2012-10-25 LAB — ANTI-JO 1 ANTIBODY, IGG: Anti JO-1: <0.2 AI (ref 0.0–0.9)

## 2012-10-25 LAB — SJOGRENS SYNDROME-B EXTRACTABLE NUCLEAR ANTIBODY: SSB (La) (ENA) Antibody, IgG: 1 AU/mL (ref <30)

## 2012-10-25 LAB — ANTI-SCLERODERMA ANTIBODY: Scleroderma (Scl-70) (ENA) Antibody, IgG: 1 AU/mL (ref <30)

## 2012-11-17 ENCOUNTER — Encounter: Payer: Self-pay | Admitting: Internal Medicine

## 2012-11-17 ENCOUNTER — Ambulatory Visit (INDEPENDENT_AMBULATORY_CARE_PROVIDER_SITE_OTHER): Payer: Medicare Other | Admitting: Internal Medicine

## 2012-11-17 VITALS — BP 129/66 | HR 63 | Ht 70.5 in | Wt 215.8 lb

## 2012-11-17 DIAGNOSIS — I255 Ischemic cardiomyopathy: Secondary | ICD-10-CM

## 2012-11-17 DIAGNOSIS — I5022 Chronic systolic (congestive) heart failure: Secondary | ICD-10-CM

## 2012-11-17 DIAGNOSIS — I2589 Other forms of chronic ischemic heart disease: Secondary | ICD-10-CM

## 2012-11-17 DIAGNOSIS — I4891 Unspecified atrial fibrillation: Secondary | ICD-10-CM

## 2012-11-17 DIAGNOSIS — Z95810 Presence of automatic (implantable) cardiac defibrillator: Secondary | ICD-10-CM

## 2012-11-17 LAB — ICD DEVICE OBSERVATION
BATTERY VOLTAGE: 2.5568 V
BRDY-0002RV: 40 {beats}/min
HV IMPEDENCE: 41 Ohm
RV LEAD AMPLITUDE: 12 mv
RV LEAD IMPEDENCE ICD: 437.5 Ohm
RV LEAD THRESHOLD: 1 V
TOT-0010: 6
TZON-0003SLOWVT: 360 ms
VF: 0

## 2012-11-17 NOTE — Patient Instructions (Signed)
Your physician recommends that you continue on your current medications as directed. Please refer to the Current Medication list given to you today.  Your physician wants you to follow-up in: one year with Dr. Taylor.  You will receive a reminder letter in the mail two months in advance. If you don't receive a letter, please call our office to schedule the follow-up appointment.  

## 2012-11-20 ENCOUNTER — Encounter: Payer: Self-pay | Admitting: Internal Medicine

## 2012-11-20 NOTE — Progress Notes (Signed)
HPI Karl [name] returns today for ICD followup. He is a very pleasant 77 year old man with a history of atrial fibrillation, and an ischemic cardiomyopathy, status post bypass surgery in the past. He has chronic systolic heart failure which is well compensated and class II. He has done well on dofetilide for his atrial fibrillation. He does not feel palpitations or any symptomatic atrial fib. He denies chest pain or shortness of breath or syncope. No peripheral edema. Allergies  Allergen Reactions  . Rosiglitazone-Metformin     Unknown  . Amiodarone Other (See Comments)    Tremor      Current Outpatient Prescriptions  Medication Sig Dispense Refill  . aspirin 81 MG tablet Take 81 mg by mouth daily.        . carvedilol (COREG) 3.125 MG tablet Take 6.25 mg by mouth 2 (two) times daily with a meal.       . Cyanocobalamin (VITAMIN B-12) 2500 MCG SUBL Place 1 tablet under the tongue daily.        . dabigatran (PRADAXA) 150 MG CAPS Take 150 mg by mouth every 12 (twelve) hours.      . dofetilide (TIKOSYN) 250 MCG capsule Take 250 mcg by mouth 2 (two) times daily.      . furosemide (LASIX) 40 MG tablet Take 40 mg by mouth daily.      Marland Kitchen glimepiride (AMARYL) 1 MG tablet Take 1 mg by mouth 3 (three) times daily.       Marland Kitchen losartan (COZAAR) 100 MG tablet Take 100 mg by mouth daily.        . nitroGLYCERIN (NITROSTAT) 0.4 MG SL tablet Place 0.4 mg under the tongue every 5 (five) minutes as needed. For chest pain      . Omega-3 Fatty Acids (FISH OIL) 1000 MG CAPS Take by mouth 2 (two) times daily.        . potassium chloride SA (K-DUR,KLOR-CON) 20 MEQ tablet Take 1 tablet (20 mEq total) by mouth daily.  30 tablet  12  . simvastatin (ZOCOR) 40 MG tablet Take 40 mg by mouth at bedtime.         No current facility-administered medications for this visit.     Past Medical History  Diagnosis Date  . Coronary artery disease     status post CABG by Dr. Andrey Campanile in 1993 and status post percutaneous  transluminal coronary angioplasty and stenting by Dr. Donnie Aho in 2002 and 2006  . Carotid artery occlusion     severe left internal carotid artery stenosis, asymptomatic status post carotid endarterectomy  . Hypertensive heart disease without CHF   . Carotid artery disease   . Hyperlipidemia   . ICD (implantable cardiac defibrillator) in place   . CHF (congestive heart failure)   . Myocardial infarction ~ 1992  . Anginal pain   . Pacemaker   . 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 H/O hiatal hernia     "gone after my bypass"  . Arthritis     "neck, back, knees"  . Ischemic cardiomyopathy   . Atrial fibrillation     ROS:   All systems reviewed and negative except as noted in the HPI.   Past Surgical History  Procedure Laterality Date  . Carotid endarterectomy  2009    left  . Cardiac defibrillator placement  05/2006    St Jude   . Eye surgery  March 2013    Cataract Left eye  . Eye surgery  06/08/2011  Cataract Right eye  . Pr vein bypass graft,aorto-fem-pop    . Inguinal hernia repair  1970's    bilaterally  . Hemorrhoid surgery  1970's  . Coronary angioplasty with stent placement      "I've got 2" (08/10/11)  . Cardioversion  08/2010  . Insert / replace / remove pacemaker  05/2006    "got a defibrillator/pacemaker" (08/10/11)  . Coronary artery bypass graft  08/1991    CABG X5  . Cardioversion  08/13/2011    Procedure: CARDIOVERSION;  Surgeon: Othella Boyer, MD;  Location: [facility] OR;  Service: Cardiovascular;  Laterality: N/A;     Family History  Problem Relation Age of Onset  . Diabetes Father   . Heart disease Father     Heart Disease before age 68  . Heart attack Father      History   Social History  . Marital Status: Married    Spouse Name: N/A    Number of Children: N/A  . Years of Education: N/A   Occupational History  . Not on file.   Social History Main Topics  . Smoking status: Former Smoker -- 1.00 packs/day for 26 years     Types: Cigarettes    Quit date: 10/20/1980  . Smokeless tobacco: Current User    Types: Chew     Comment: 08/10/11 "still use a little chew q now and then"  . Alcohol Use: 8.4 oz/week    14 Shots of liquor per week  . Drug Use: No  . Sexual Activity: Not Currently   Other Topics Concern  . Not on file   Social History Narrative  . No narrative on file     BP 129/66  Pulse 63  Ht 5' 10.5" (1.791 m)  Wt 215 lb 12.8 oz (97.886 kg)  BMI 30.52 kg/m2  Physical Exam:  Well appearing 77 year old man, NAD HEENT: Unremarkable Neck:  No JVD, no thyromegally Lungs:  Clear with no wheezes, rales, or rhonchi. HEART:  Regular rate rhythm, no murmurs, no rubs, no clicks Abd:  soft, positive bowel sounds, no organomegally, no rebound, no guarding Ext:  2 plus pulses, no edema, no cyanosis, no clubbing Skin:  No rashes no nodules Neuro:  CN II through XII intact, motor grossly intact  ECG - normal sinus rhythm with normal axis and intervals  DEVICE  Normal device function.  See PaceArt for details.   Assess/Plan:

## 2012-11-30 ENCOUNTER — Ambulatory Visit (INDEPENDENT_AMBULATORY_CARE_PROVIDER_SITE_OTHER)
Admission: RE | Admit: 2012-11-30 | Discharge: 2012-11-30 | Disposition: A | Discharge status: Home / Self Care | Payer: Medicare Other | Source: Ambulatory Visit | Attending: Pulmonary Disease | Admitting: Pulmonary Disease

## 2012-11-30 ENCOUNTER — Encounter: Payer: Self-pay | Admitting: Pulmonary Disease

## 2012-11-30 DIAGNOSIS — J849 Interstitial pulmonary disease, unspecified: Secondary | ICD-10-CM

## 2012-11-30 DIAGNOSIS — J8410 Pulmonary fibrosis, unspecified: Secondary | ICD-10-CM

## 2012-12-01 NOTE — Progress Notes (Signed)
Quick Note:  Advised pt of results per DM. Pt verbalized understanding and will keep f/u appt 12/08/12 @ 9:00 ______

## 2012-12-08 ENCOUNTER — Encounter: Payer: Self-pay | Admitting: Pulmonary Disease

## 2012-12-08 ENCOUNTER — Ambulatory Visit (INDEPENDENT_AMBULATORY_CARE_PROVIDER_SITE_OTHER): Payer: Medicare Other | Admitting: Pulmonary Disease

## 2012-12-08 VITALS — BP 116/68 | HR 65 | Temp 97.7°F | Ht 69.0 in | Wt 217.0 lb

## 2012-12-08 DIAGNOSIS — J8410 Pulmonary fibrosis, unspecified: Secondary | ICD-10-CM

## 2012-12-08 DIAGNOSIS — M0510 Rheumatoid lung disease with rheumatoid arthritis of unspecified site: Secondary | ICD-10-CM

## 2012-12-08 DIAGNOSIS — R0602 Shortness of breath: Secondary | ICD-10-CM

## 2012-12-08 NOTE — Assessment & Plan Note (Signed)
It's not entirely clear to me if this is NSIP or UIP, but given the newly diagnosed rheumatoid arthritis, it is definitely not IPF.    Fortunately, despite the abnormalities on his CT and his PFT's, he remains quite functional and is not limited by his breathing.  Based on this, I don't feel that it is a good idea to biopsy him or start empiric anti-inflammatory medicines.  Plan: -6 MW today -repeat PFT/6MW in 6 months -if worsening come back sooner -if worsening, start prednisone +/- imuran

## 2012-12-08 NOTE — Progress Notes (Signed)
Subjective:    Patient ID: Karl [name], male    DOB: 1935/12/13, 77 y.o.   MRN: [id number]  Synopsis: Karl [name] first saw the [facility] office in the fall of 2014 for pulmonary fibrosis diagnosed in the setting of newly diagnosed rheumatoid arthritis.  CT scan findings were felt to be non-specific. He formerly smoked 1 ppd for 26 years.  HPI  12/08/2012 ROV > Saw Dr. Nickola Major last week and she has confirmed that he has rheumatoid arthritis and that is his primary limitation.  He feels that he is not too limited with his breathing.  Sometimes if he exerts himself heavily he will fee some shortness of breath.  Usually only lasts a few seconds. 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  Past Medical History  Diagnosis Date  . Coronary artery disease     status post CABG by Dr. Andrey Campanile in 1993 and status post percutaneous transluminal coronary angioplasty and stenting by Dr. Donnie Aho in 2002 and 2006  . Carotid artery occlusion     severe left internal carotid artery stenosis, asymptomatic status post carotid endarterectomy  . Hypertensive heart disease without CHF   . Carotid artery disease   . Hyperlipidemia   . ICD (implantable cardiac defibrillator) in place   . CHF (congestive heart failure)   . Myocardial infarction ~ 1992  . Anginal pain   . Pacemaker   . 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 H/O hiatal hernia     "gone after my bypass"  . Arthritis     "neck, back, knees"  . Ischemic cardiomyopathy   . Atrial fibrillation      Review of Systems  Constitutional: Positive for fatigue. Negative for fever and chills.  HENT: Negative for congestion, nosebleeds, postnasal drip and rhinorrhea.   Respiratory: Positive for shortness of breath. Negative for cough and wheezing.   Cardiovascular: Negative for chest pain, palpitations and leg swelling.       Objective:   Physical Exam  Filed Vitals:   12/08/12 0902  BP: 116/68   Pulse: 65  Temp: 97.7 F (36.5 C)  TempSrc: Oral  Height: 5\' 9"  (1.753 m)  Weight: 217 lb (98.431 kg)  SpO2: 95%  RA  Gen: well appearing HEENT: NCAT, EOMi PULM: Few insp crackles throughout CV: RRR, no mgr AB: BS+, soft, nontender Ext: trace edema  June 2014 full pulmonary function testing> ratio 86%, FEV1 2.91 L (96% predicted), no change with bronchodilator, total lung capacity 5.46 L (74% predicted) DLCO 15.09 (47% predicted) May 2014 CT chest>> there is no evidence of a pulmonary embolism, there is centrilobular emphysema seen throughout with significant paraseptal emphysema. There is also intralobular septal thickening and groundglass throughout the lungs and periphery in a fairly diffuse pattern. There no prior studies for further evaluation. 11/2012 CT Chest > no comment if progression from prior studies, persistent ground glass and interlobular septal thickening with emphysema; felt to be consistent with NSIP or UIP      Assessment & Plan:   Rheumatoid lung disease It's not entirely clear to me if this is NSIP or UIP, but given the newly diagnosed rheumatoid arthritis, it is definitely not IPF.    Fortunately, despite the abnormalities on his CT and his PFT's, he remains quite functional and is not limited by his breathing.  Based on this, I don't feel that it is a good idea to biopsy him or start empiric anti-inflammatory medicines.  Plan: -6  MW today -repeat PFT/6MW in 6 months -if worsening come back sooner -if worsening, start prednisone +/- imuran   Updated Medication List Outpatient Encounter Prescriptions as of 12/08/2012  Medication Sig  . aspirin 81 MG tablet Take 81 mg by mouth daily.    . carvedilol (COREG) 3.125 MG tablet Take 6.25 mg by mouth 2 (two) times daily with a meal.   . Cyanocobalamin (VITAMIN B-12) 2500 MCG SUBL Place 1 tablet under the tongue daily.    . dabigatran (PRADAXA) 150 MG CAPS Take 150 mg by mouth every 12 (twelve) hours.  .  dofetilide (TIKOSYN) 250 MCG capsule Take 250 mcg by mouth 2 (two) times daily.  . furosemide (LASIX) 40 MG tablet Take 40 mg by mouth daily.  Marland Kitchen glimepiride (AMARYL) 1 MG tablet Take 1 mg by mouth 3 (three) times daily.   Marland Kitchen losartan (COZAAR) 100 MG tablet Take 100 mg by mouth daily.    . nitroGLYCERIN (NITROSTAT) 0.4 MG SL tablet Place 0.4 mg under the tongue every 5 (five) minutes as needed. For chest pain  . Omega-3 Fatty Acids (FISH OIL) 1000 MG CAPS Take by mouth 2 (two) times daily.    . potassium chloride SA (K-DUR,KLOR-CON) 20 MEQ tablet Take 1 tablet (20 mEq total) by mouth daily.  . simvastatin (ZOCOR) 40 MG tablet Take 40 mg by mouth at bedtime.

## 2012-12-08 NOTE — Patient Instructions (Signed)
We will set up a full Pulmonary Function Test for you in the next month Call us if your breathing gets worse in the next few weeks Please see your rheumatologist doctor again if your arthritis gets worse We will see you next in 6 months or sooner if needed

## 2012-12-12 NOTE — Progress Notes (Signed)
6mw visit

## 2012-12-15 ENCOUNTER — Ambulatory Visit (INDEPENDENT_AMBULATORY_CARE_PROVIDER_SITE_OTHER): Payer: Medicare Other | Admitting: Pulmonary Disease

## 2012-12-15 DIAGNOSIS — R0602 Shortness of breath: Secondary | ICD-10-CM

## 2012-12-15 NOTE — Progress Notes (Signed)
PFT done today. 

## 2012-12-21 ENCOUNTER — Encounter: Payer: Self-pay | Admitting: Pulmonary Disease

## 2012-12-21 ENCOUNTER — Telehealth: Payer: Self-pay | Admitting: *Deleted

## 2012-12-21 NOTE — Telephone Encounter (Signed)
Message copied by Christen Butter on Wed Dec 21, 2012  1:48 PM ------      Message from: Max Fickle B      Created: Wed Dec 21, 2012 10:07 AM       L,            Please let him know that his PFT's looked OK.            Thanks      B ------

## 2012-12-21 NOTE — Telephone Encounter (Signed)
Spoke with pt and notified of results per Dr.McQuaid. Pt verbalized understanding and denied any questions. 

## 2013-01-05 ENCOUNTER — Encounter: Payer: Self-pay | Admitting: Pulmonary Disease

## 2013-02-27 LAB — PULMONARY FUNCTION TEST
DL/VA % pred: 77 %
DL/VA: 3.52 ml/min/mmHg/L
DLCO unc % pred: 59 %
DLCO unc: 18.34 ml/min/mmHg
FEF 25-75 Post: 3.68 L/sec
FEF 25-75 Pre: 2.88 L/sec
FEF2575-%Change-Post: 27 %
FEF2575-%Pred-Post: 173 %
FEF2575-%Pred-Pre: 136 %
FEV1-%Change-Post: 5 %
FEV1-%Pred-Post: 102 %
FEV1-%Pred-Pre: 97 %
FEV1-Post: 3.01 L
FEV1-Pre: 2.85 L
FEV1FVC-%Change-Post: 2 %
FEV1FVC-%Pred-Pre: 110 %
FEV6-%Change-Post: 2 %
FEV6-%Pred-Post: 95 %
FEV6-%Pred-Pre: 92 %
FEV6-Post: 3.61 L
FEV6-Pre: 3.52 L
FEV6FVC-%Change-Post: 0 %
FEV6FVC-%Pred-Post: 106 %
FEV6FVC-%Pred-Pre: 105 %
FVC-%Change-Post: 2 %
FVC-%Pred-Post: 89 %
FVC-%Pred-Pre: 87 %
FVC-Post: 3.63 L
Post FEV1/FVC ratio: 83 %
Post FEV6/FVC ratio: 100 %
Pre FEV1/FVC ratio: 80 %
Pre FEV6/FVC Ratio: 99 %
RV % pred: 74 %
RV: 1.87 L
TLC % pred: 79 %
TLC: 5.4 L

## 2013-02-28 NOTE — Progress Notes (Signed)
LMTCB X1 

## 2013-03-06 ENCOUNTER — Other Ambulatory Visit: Payer: Self-pay | Admitting: Internal Medicine

## 2013-03-21 ENCOUNTER — Other Ambulatory Visit: Payer: Self-pay | Admitting: Vascular Surgery

## 2013-03-21 DIAGNOSIS — Z48812 Encounter for surgical aftercare following surgery on the circulatory system: Secondary | ICD-10-CM

## 2013-03-21 DIAGNOSIS — I6529 Occlusion and stenosis of unspecified carotid artery: Secondary | ICD-10-CM

## 2013-06-15 ENCOUNTER — Other Ambulatory Visit: Payer: Self-pay | Admitting: Internal Medicine

## 2013-06-15 DIAGNOSIS — R42 Dizziness and giddiness: Secondary | ICD-10-CM

## 2013-06-15 DIAGNOSIS — R519 Headache, unspecified: Secondary | ICD-10-CM

## 2013-06-15 DIAGNOSIS — R51 Headache: Secondary | ICD-10-CM

## 2013-06-21 ENCOUNTER — Ambulatory Visit
Admission: RE | Admit: 2013-06-21 | Discharge: 2013-06-21 | Disposition: A | Discharge status: Home / Self Care | Payer: Medicare Other | Source: Ambulatory Visit | Attending: Internal Medicine | Admitting: Internal Medicine

## 2013-06-21 DIAGNOSIS — R42 Dizziness and giddiness: Secondary | ICD-10-CM

## 2013-06-21 DIAGNOSIS — R51 Headache: Secondary | ICD-10-CM

## 2013-06-21 DIAGNOSIS — R519 Headache, unspecified: Secondary | ICD-10-CM

## 2013-06-23 ENCOUNTER — Encounter: Payer: Self-pay | Admitting: Vascular Surgery

## 2013-06-27 ENCOUNTER — Encounter: Payer: Self-pay | Admitting: Vascular Surgery

## 2013-06-27 ENCOUNTER — Ambulatory Visit (INDEPENDENT_AMBULATORY_CARE_PROVIDER_SITE_OTHER): Payer: Medicare Other | Admitting: Vascular Surgery

## 2013-06-27 ENCOUNTER — Ambulatory Visit (HOSPITAL_COMMUNITY)
Admission: RE | Admit: 2013-06-27 | Discharge: 2013-06-27 | Disposition: A | Discharge status: Home / Self Care | Payer: Medicare Other | Source: Ambulatory Visit | Attending: Vascular Surgery | Admitting: Vascular Surgery

## 2013-06-27 VITALS — BP 140/50 | HR 58 | Ht 69.0 in | Wt 223.0 lb

## 2013-06-27 DIAGNOSIS — I6529 Occlusion and stenosis of unspecified carotid artery: Secondary | ICD-10-CM

## 2013-06-27 DIAGNOSIS — Z48812 Encounter for surgical aftercare following surgery on the circulatory system: Secondary | ICD-10-CM | POA: Insufficient documentation

## 2013-06-27 NOTE — Progress Notes (Signed)
Subjective:     Patient ID: Karl [name], male   DOB: [birth year], 78 y.o.   MRN: [id number]  HPI this 78 year old male returns for continued followup regarding his carotid occlusive disease. He underwent left carotid endarterectomy by me in 09. He has had a mild right ICA stenosis and no severe recurrent disease on the left side for the last 6 years. He denies any specific neurologic symptoms such as lateralizing weakness, aphasia, amaurosis fugax, diplopia, blurred vision, or syncope. He does have a history of coronary artery bypass grafting in 1993 by Dr. Redmond Pulling and has had cardiac stents placed. His biggest limitation at the present time his left knee pain.  Past Medical History  Diagnosis Date  . Coronary artery disease     status post CABG by Dr. Redmond Pulling in 1993 and status post percutaneous transluminal coronary angioplasty and stenting by Dr. Wynonia Lawman in 2002 and 2006  . Carotid artery occlusion     severe left internal carotid artery stenosis, asymptomatic status post carotid endarterectomy  . Hypertensive heart disease without CHF   . Carotid artery disease   . Hyperlipidemia   . ICD (implantable cardiac defibrillator) in place   . CHF (congestive heart failure)   . Myocardial infarction ~ 1992  . Anginal pain   . Pacemaker   . 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 H/O hiatal hernia     "gone after my bypass"  . Arthritis     "neck, back, knees"  . Ischemic cardiomyopathy   . Atrial fibrillation   . Rheumatoid arthritis(714.0)     History  Substance Use Topics  . Smoking status: Former Smoker -- 1.00 packs/day for 26 years    Types: Cigarettes    Quit date: 10/20/1980  . Smokeless tobacco: Current User    Types: Chew     Comment: 08/10/11 "still use a little chew q now and then"  . Alcohol Use: 8.4 oz/week    14 Shots of liquor per week    Family History  Problem Relation Age of Onset  . Diabetes Father   . Heart disease Father     Heart Disease  before age 69  . Heart attack Father     Allergies  Allergen Reactions  . Rosiglitazone-Metformin     Unknown  . Amiodarone Other (See Comments)    Tremor     Current outpatient prescriptions:aspirin 81 MG tablet, Take 81 mg by mouth daily.  , Disp: , Rfl: ;  carvedilol (COREG) 3.125 MG tablet, Take 6.25 mg by mouth 2 (two) times daily with a meal. , Disp: , Rfl: ;  Cyanocobalamin (VITAMIN B-12) 2500 MCG SUBL, Place 1 tablet under the tongue daily.  , Disp: , Rfl: ;  dofetilide (TIKOSYN) 250 MCG capsule, Take 250 mcg by mouth 2 (two) times daily., Disp: , Rfl:  furosemide (LASIX) 40 MG tablet, Take 40 mg by mouth daily., Disp: , Rfl: ;  glimepiride (AMARYL) 1 MG tablet, Take 1 mg by mouth 3 (three) times daily. , Disp: , Rfl: ;  losartan (COZAAR) 100 MG tablet, Take 100 mg by mouth daily.  , Disp: , Rfl: ;  nitroGLYCERIN (NITROSTAT) 0.4 MG SL tablet, Place 0.4 mg under the tongue every 5 (five) minutes as needed. For chest pain, Disp: , Rfl:  Omega-3 Fatty Acids (FISH OIL) 1000 MG CAPS, Take by mouth 2 (two) times daily.  , Disp: , Rfl: ;  potassium chloride SA (K-DUR,KLOR-CON) 20 MEQ tablet, Take  1 tablet (20 mEq total) by mouth daily., Disp: 30 tablet, Rfl: 12;  PRADAXA 150 MG CAPS capsule, TAKE 1 CAPSULE BY MOUTH TWICE DAILY, Disp: 60 capsule, Rfl: 5;  simvastatin (ZOCOR) 40 MG tablet, Take 40 mg by mouth at bedtime.  , Disp: , Rfl:   BP 140/50  Pulse 58  Ht 5\' 9"  (1.753 m)  Wt 223 lb (101.152 kg)  BMI 32.92 kg/m2  SpO2 97%  Body mass index is 32.92 kg/(m^2).           Review of Systems denies chest pain but does have significant dyspnea on exertion. Complains of severe left knee discomfort and needs "knee replacement". Has also been recently started on insulin for his diabetes and states that his blood sugars are low in the morning so he has discontinued insulin and will discuss this further with Dr. Virgina Jock. All other systems negative and a complete review of     Objective:    Physical Exam BP 140/50  Pulse 58  Ht 5\' 9"  (1.753 m)  Wt 223 lb (101.152 kg)  BMI 32.92 kg/m2  SpO2 97%  Gen.-alert and oriented x3 in no apparent distress HEENT normal for age Lungs no rhonchi or wheezing-implantable defibrillator palpable in the left infraclavicular area Cardiovascular regular rhythm no murmurs carotid pulses 3+ palpable no bruits audible Abdomen soft nontender no palpable masses-obese  Musculoskeletal free of  major deformities Skin clear -no rashes Neurologic normal Lower extremities 3+ femoral and dorsalis pedis pulses palpable bilaterally with no edema  Today I ordered carotid duplex exam which I reviewed and interpreted. There is minimal right ICA stenosis. There is mild intimal hyperplasia at the proximal end of the patch and the left carotid endarterectomy site performed in 2009. Stenosis is only 40-50%.       Assessment:     Doing well 6 years post left carotid endarterectomy with no active neurologic symptoms-mild restenosis proximal end of patch on left carotid endarterectomy site which is not severe enough to consider treatment    Plan:     Return in one year with followup carotid duplex exam Continue daily aspirin

## 2013-06-27 NOTE — Addendum Note (Signed)
Addended by: Mena Goes on: 06/27/2013 02:53 PM   Modules accepted: Orders

## 2013-10-02 ENCOUNTER — Telehealth: Payer: Self-pay | Admitting: Internal Medicine

## 2013-10-02 NOTE — Telephone Encounter (Signed)
Ok thanks. Whatever you decide. Noticed he did not follow with you either

## 2013-10-02 NOTE — Telephone Encounter (Signed)
Karl [name],   Back in oct 2015 due to his RA relatd ILD you  sent your CMA/RN Karl [name]  to set up appt with me. This never happened.  Do you still want me to see him?   Thanks  MR

## 2013-10-02 NOTE — Telephone Encounter (Signed)
Let me see him again first and decide.  I think the answer is yes, but I'd like to talk to him in person first

## 2013-11-22 ENCOUNTER — Ambulatory Visit (INDEPENDENT_AMBULATORY_CARE_PROVIDER_SITE_OTHER): Payer: Medicare Other | Admitting: Internal Medicine

## 2013-11-22 ENCOUNTER — Encounter: Payer: Self-pay | Admitting: Internal Medicine

## 2013-11-22 VITALS — BP 146/62 | HR 63 | Ht 71.0 in | Wt 211.8 lb

## 2013-11-22 DIAGNOSIS — I255 Ischemic cardiomyopathy: Secondary | ICD-10-CM

## 2013-11-22 DIAGNOSIS — Z95810 Presence of automatic (implantable) cardiac defibrillator: Secondary | ICD-10-CM

## 2013-11-22 DIAGNOSIS — Z4502 Encounter for adjustment and management of automatic implantable cardiac defibrillator: Secondary | ICD-10-CM

## 2013-11-22 DIAGNOSIS — I5022 Chronic systolic (congestive) heart failure: Secondary | ICD-10-CM

## 2013-11-22 DIAGNOSIS — I480 Paroxysmal atrial fibrillation: Secondary | ICD-10-CM

## 2013-11-22 LAB — MDC_IDC_ENUM_SESS_TYPE_INCLINIC
Battery Remaining Longevity: 30 mo
Battery Voltage: 2.56 V
Brady Statistic RV Percent Paced: 0.26 %
Date Time Interrogation Session: 20151021122545
HighPow Impedance: 45.1731
Implantable Pulse Generator Serial Number: 446149
Lead Channel Impedance Value: 450 Ohm
Lead Channel Pacing Threshold Amplitude: 0.75 V
Lead Channel Pacing Threshold Amplitude: 0.75 V
Lead Channel Pacing Threshold Pulse Width: 0.5 ms
Lead Channel Pacing Threshold Pulse Width: 0.5 ms
Lead Channel Sensing Intrinsic Amplitude: 11.7 mV
Lead Channel Setting Pacing Amplitude: 2.5 V
Lead Channel Setting Pacing Pulse Width: 0.5 ms
Lead Channel Setting Sensing Sensitivity: 0.3 mV
Zone Setting Detection Interval: 290 ms
Zone Setting Detection Interval: 360 ms

## 2013-11-22 NOTE — Patient Instructions (Signed)
Your physician recommends that you continue on your current medications as directed. Please refer to the Current Medication list given to you today.  Remote monitoring is used to monitor your Pacemaker of ICD from home. This monitoring reduces the number of office visits required to check your device to one time per year. It allows [place] to keep an eye on the functioning of your device to ensure it is working properly. You are scheduled for a device check from home on 02/26/13. You may send your transmission at any time that day. If you have a wireless device, the transmission will be sent automatically. After your physician reviews your transmission, you will receive a postcard with your next transmission date.  Your physician wants you to follow-up in: 1 year with Dr. Lovena Le.  You will receive a reminder letter in the mail two months in advance. If you don't receive a letter, please call our office to schedule the follow-up appointment.

## 2013-11-22 NOTE — Assessment & Plan Note (Signed)
His symptoms are class II. He remains active. He will maintain a low-sodium diet.

## 2013-11-22 NOTE — Assessment & Plan Note (Signed)
His St. Jude ICD is working normally. We'll plan to recheck in several months. 

## 2013-11-22 NOTE — Progress Notes (Signed)
HPI [name] returns today for ICD followup. He is a very pleasant 78 year old man with a history of atrial fibrillation, and an ischemic cardiomyopathy, status post bypass surgery in the past. He has chronic systolic heart failure which is well compensated and class II. He has done well on dofetilide for his atrial fibrillation. He does not feel palpitations or any symptomatic atrial fib. He denies chest pain or shortness of breath or syncope. No peripheral edema. His activity appears to be limited if anything by arthritis, particularly with pain in his left knee. Allergies  Allergen Reactions  . Amiodarone Other (See Comments)    Tremor   . Rosiglitazone-Metformin     Unknown     Current Outpatient Prescriptions  Medication Sig Dispense Refill  . aspirin 81 MG tablet Take 81 mg by mouth daily.        . Canagliflozin (INVOKANA) 300 MG TABS Take 1 tablet by mouth daily.      . carvedilol (COREG) 3.125 MG tablet Take 6.25 mg by mouth 2 (two) times daily with a meal.       . Cyanocobalamin (VITAMIN B-12) 2500 MCG SUBL Place 1 tablet under the tongue daily.        Marland Kitchen dofetilide (TIKOSYN) 250 MCG capsule Take 250 mcg by mouth 2 (two) times daily.      . furosemide (LASIX) 40 MG tablet Take 20 mg by mouth daily.       . insulin detemir (LEVEMIR) 100 UNIT/ML injection Inject 26 Units into the skin at bedtime.      Marland Kitchen losartan (COZAAR) 100 MG tablet Take 100 mg by mouth daily.        . nitroGLYCERIN (NITROSTAT) 0.4 MG SL tablet Place 0.4 mg under the tongue every 5 (five) minutes as needed. For chest pain      . Omega-3 Fatty Acids (FISH OIL) 1000 MG CAPS Take by mouth 2 (two) times daily.        Marland Kitchen PRADAXA 150 MG CAPS capsule TAKE 1 CAPSULE BY MOUTH TWICE DAILY  60 capsule  5  . simvastatin (ZOCOR) 40 MG tablet Take 40 mg by mouth at bedtime.        . potassium chloride SA (K-DUR,KLOR-CON) 20 MEQ tablet Take 1 tablet (20 mEq total) by mouth daily.  30 tablet  12   No current  facility-administered medications for this visit.     Past Medical History  Diagnosis Date  . Coronary artery disease     status post CABG by Dr. Redmond Pulling in 1993 and status post percutaneous transluminal coronary angioplasty and stenting by Dr. Wynonia Lawman in 2002 and 2006  . Carotid artery occlusion     severe left internal carotid artery stenosis, asymptomatic status post carotid endarterectomy  . Hypertensive heart disease without CHF   . Carotid artery disease   . Hyperlipidemia   . ICD (implantable cardiac defibrillator) in place   . CHF (congestive heart failure)   . Myocardial infarction ~ 1992  . Anginal pain   . Pacemaker   . 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 H/O hiatal hernia     "gone after my bypass"  . Arthritis     "neck, back, knees"  . Ischemic cardiomyopathy   . Atrial fibrillation   . Rheumatoid arthritis(714.0)     ROS:   All systems reviewed and negative except as noted in the HPI.   Past Surgical History  Procedure Laterality Date  . Carotid endarterectomy  2009    left  . Cardiac defibrillator placement  05/2006    St Jude   . Eye surgery  March 2013    Cataract Left eye  . Eye surgery  06/08/2011    Cataract Right eye  . Pr vein bypass graft,aorto-fem-pop    . Inguinal hernia repair  1970's    bilaterally  . Hemorrhoid surgery  1970's  . Coronary angioplasty with stent placement      "I've got 2" (08/10/11)  . Cardioversion  08/2010  . Insert / replace / remove pacemaker  05/2006    "got a defibrillator/pacemaker" (08/10/11)  . Coronary artery bypass graft  08/1991    CABG X5  . Cardioversion  08/13/2011    Procedure: CARDIOVERSION;  Surgeon: Jacolyn Reedy, MD;  Location: [facility] OR;  Service: Cardiovascular;  Laterality: N/A;     Family History  Problem Relation Age of Onset  . Diabetes Father   . Heart disease Father     Heart Disease before age 46  . Heart attack Father      History   Social History  . Marital  Status: Married    Spouse Name: N/A    Number of Children: N/A  . Years of Education: N/A   Occupational History  . Not on file.   Social History Main Topics  . Smoking status: Former Smoker -- 1.00 packs/day for 26 years    Types: Cigarettes    Quit date: 10/20/1980  . Smokeless tobacco: Current User    Types: Chew     Comment: 08/10/11 "still use a little chew q now and then"  . Alcohol Use: 8.4 oz/week    14 Shots of liquor per week  . Drug Use: No  . Sexual Activity: Not Currently   Other Topics Concern  . Not on file   Social History Narrative  . No narrative on file     BP 146/62  Pulse 63  Ht 5\' 11"  (1.803 m)  Wt 211 lb 12.8 oz (96.072 kg)  BMI 29.55 kg/m2  Physical Exam:  Well appearing 78 year old man, NAD HEENT: Unremarkable Neck:  No JVD, no thyromegally Lungs:  Clear with no wheezes, rales, or rhonchi. HEART:  Regular rate rhythm, no murmurs, no rubs, no clicks Abd:  soft, positive bowel sounds, no organomegally, no rebound, no guarding Ext:  2 plus pulses, no edema, no cyanosis, no clubbing Skin:  No rashes no nodules Neuro:  CN II through XII intact, motor grossly intact  ECG - normal sinus rhythm with normal axis and intervals  DEVICE  Normal device function.  See PaceArt for details.   Assess/Plan:

## 2013-11-22 NOTE — Assessment & Plan Note (Signed)
His ventricular rates appear to be well-controlled. He appears to be maintaining sinus rhythm very nicely on dofetilide.

## 2014-01-11 ENCOUNTER — Encounter (HOSPITAL_COMMUNITY): Payer: Self-pay | Admitting: Cardiology

## 2014-02-23 ENCOUNTER — Encounter: Payer: Medicare Other | Admitting: *Deleted

## 2014-02-26 ENCOUNTER — Telehealth: Payer: Self-pay | Admitting: Cardiology

## 2014-02-26 NOTE — Telephone Encounter (Signed)
LMOVM reminding pt to send remote transmission.   

## 2014-02-27 ENCOUNTER — Encounter: Payer: Self-pay | Admitting: Cardiology

## 2014-06-29 ENCOUNTER — Encounter: Payer: Self-pay | Admitting: Family

## 2014-07-03 ENCOUNTER — Encounter: Payer: Self-pay | Admitting: Family

## 2014-07-03 ENCOUNTER — Ambulatory Visit (HOSPITAL_COMMUNITY)
Admission: RE | Admit: 2014-07-03 | Discharge: 2014-07-03 | Disposition: A | Discharge status: Home / Self Care | Payer: Medicare Other | Source: Ambulatory Visit | Attending: Vascular Surgery | Admitting: Vascular Surgery

## 2014-07-03 ENCOUNTER — Ambulatory Visit (INDEPENDENT_AMBULATORY_CARE_PROVIDER_SITE_OTHER): Payer: Medicare Other | Admitting: Family

## 2014-07-03 VITALS — BP 129/64 | HR 63 | Resp 16 | Ht 71.0 in | Wt 210.0 lb

## 2014-07-03 DIAGNOSIS — Z87891 Personal history of nicotine dependence: Secondary | ICD-10-CM | POA: Diagnosis not present

## 2014-07-03 DIAGNOSIS — I6523 Occlusion and stenosis of bilateral carotid arteries: Secondary | ICD-10-CM

## 2014-07-03 DIAGNOSIS — Z9889 Other specified postprocedural states: Secondary | ICD-10-CM

## 2014-07-03 NOTE — Patient Instructions (Signed)
Stroke Prevention Some medical conditions and behaviors are associated with an increased chance of having a stroke. You may prevent a stroke by making healthy choices and managing medical conditions. HOW CAN I REDUCE MY RISK OF HAVING A STROKE?   Stay physically active. Get at least 30 minutes of activity on most or all days.  Do not smoke. It may also be helpful to avoid exposure to secondhand smoke.  Limit alcohol use. Moderate alcohol use is considered to be:  No more than 2 drinks per day for men.  No more than 1 drink per day for nonpregnant women.  Eat healthy foods. This involves:  Eating 5 or more servings of fruits and vegetables a day.  Making dietary changes that address high blood pressure (hypertension), high cholesterol, diabetes, or obesity.  Manage your cholesterol levels.  Making food choices that are high in fiber and low in saturated fat, trans fat, and cholesterol may control cholesterol levels.  Take any prescribed medicines to control cholesterol as directed by your health care provider.  Manage your diabetes.  Controlling your carbohydrate and sugar intake is recommended to manage diabetes.  Take any prescribed medicines to control diabetes as directed by your health care provider.  Control your hypertension.  Making food choices that are low in salt (sodium), saturated fat, trans fat, and cholesterol is recommended to manage hypertension.  Take any prescribed medicines to control hypertension as directed by your health care provider.  Maintain a healthy weight.  Reducing calorie intake and making food choices that are low in sodium, saturated fat, trans fat, and cholesterol are recommended to manage weight.  Stop drug abuse.  Avoid taking birth control pills.  Talk to your health care provider about the risks of taking birth control pills if you are over 35 years old, smoke, get migraines, or have ever had a blood clot.  Get evaluated for sleep  disorders (sleep apnea).  Talk to your health care provider about getting a sleep evaluation if you snore a lot or have excessive sleepiness.  Take medicines only as directed by your health care provider.  For some people, aspirin or blood thinners (anticoagulants) are helpful in reducing the risk of forming abnormal blood clots that can lead to stroke. If you have the irregular heart rhythm of atrial fibrillation, you should be on a blood thinner unless there is a good reason you cannot take them.  Understand all your medicine instructions.  Make sure that other conditions (such as anemia or atherosclerosis) are addressed. SEEK IMMEDIATE MEDICAL CARE IF:   You have sudden weakness or numbness of the face, arm, or leg, especially on one side of the body.  Your face or eyelid droops to one side.  You have sudden confusion.  You have trouble speaking (aphasia) or understanding.  You have sudden trouble seeing in one or both eyes.  You have sudden trouble walking.  You have dizziness.  You have a loss of balance or coordination.  You have a sudden, severe headache with no known cause.  You have new chest pain or an irregular heartbeat. Any of these symptoms may represent a serious problem that is an emergency. Do not wait to see if the symptoms will go away. Get medical help at once. Call your local emergency services (911 in [place] Do not drive yourself to the hospital. Document Released: 02/27/2004 Document Revised: 06/05/2013 Document Reviewed: 07/22/2012 [employer] Patient Information 2015 [employer]. This information is not intended to replace advice given   to you by your health care provider. Make sure you discuss any questions you have with your health care provider.  

## 2014-07-03 NOTE — Addendum Note (Signed)
Addended by: Mena Goes on: 07/03/2014 05:11 PM   Modules accepted: Orders

## 2014-07-03 NOTE — Progress Notes (Signed)
Established Carotid Patient   History of Present Illness  Karl [name] is a 79 y.o. male patient of Dr. Kellie Simmering who returns for continued followup regarding his carotid occlusive disease. He underwent left carotid endarterectomy by Dr. Kellie Simmering in 2009. He has had a mild right ICA stenosis and no severe recurrent disease on the left side for the last 6 years  He has a history of coronary artery bypass grafting in 1993 by Dr. Redmond Pulling and has had cardiac stents placed. His biggest limitation at the present time his left knee pain.  The patient denies any history of TIA or stroke symptoms, specifically the patient denies a history of amaurosis fugax or monocular blindness, denies a history unilateral  of facial drooping, denies a history of hemiplegia, and denies a history of receptive or expressive aphasia.    The patient denies New Medical or Surgical History.  Pt Diabetic: yes, states his last A1C was 7.4, almost in control, states he is working with Dr. Virgina Jock to bring his DM under better control Pt smoker: former smoker, quit in the 1970's  Pt meds include: Statin : yes ASA: yes, 81 mg daily Other anticoagulants/antiplatelets: Pradaxa, apparently for CAD; he has a Investment banker, corporate. Dr. Wynonia Lawman is his cardiologist.    Past Medical History  Diagnosis Date  . Coronary artery disease     status post CABG by Dr. Redmond Pulling in 1993 and status post percutaneous transluminal coronary angioplasty and stenting by Dr. Wynonia Lawman in 2002 and 2006  . Carotid artery occlusion     severe left internal carotid artery stenosis, asymptomatic status post carotid endarterectomy  . Hypertensive heart disease without CHF   . Carotid artery disease   . Hyperlipidemia   . ICD (implantable cardiac defibrillator) in place   . CHF (congestive heart failure)   . Myocardial infarction ~ 1992  . Anginal pain   . Pacemaker   . 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 H/O hiatal hernia    "gone after my bypass"  . Arthritis     "neck, back, knees"  . Ischemic cardiomyopathy   . Atrial fibrillation   . Rheumatoid arthritis(714.0)     Social History History  Substance Use Topics  . Smoking status: Former Smoker -- 1.00 packs/day for 26 years    Types: Cigarettes    Quit date: 10/20/1980  . Smokeless tobacco: Former Systems developer    Types: [place] date: 08/02/2012     Comment: 08/10/11 "still use a little chew q now and then"  . Alcohol Use: 8.4 oz/week    14 Shots of liquor per week    Family History Family History  Problem Relation Age of Onset  . Diabetes Father   . Heart disease Father     Heart Disease before age 49  . Heart attack Father     Surgical History Past Surgical History  Procedure Laterality Date  . Carotid endarterectomy  2009    left  . Cardiac defibrillator placement  05/2006    St Jude   . Eye surgery  March 2013    Cataract Left eye  . Eye surgery  06/08/2011    Cataract Right eye  . Pr vein bypass graft,aorto-fem-pop    . Inguinal hernia repair  1970's    bilaterally  . Hemorrhoid surgery  1970's  . Coronary angioplasty with stent placement      "I've got 2" (08/10/11)  . Cardioversion  08/2010  . Insert /  replace / remove pacemaker  05/2006    "got a defibrillator/pacemaker" (08/10/11)  . Coronary artery bypass graft  08/1991    CABG X5  . Cardioversion  08/13/2011    Procedure: CARDIOVERSION;  Surgeon: Jacolyn Reedy, MD;  Location: [facility];  Service: Cardiovascular;  Laterality: N/A;  . Left heart catheterization with coronary/graft angiogram N/A 06/16/2012    Procedure: LEFT HEART CATHETERIZATION WITH Beatrix Fetters;  Surgeon: Jacolyn Reedy, MD;  Location: [facility] CATH LAB;  Service: Cardiovascular;  Laterality: N/A;    Allergies  Allergen Reactions  . Amiodarone Other (See Comments)    Tremor   . Rosiglitazone-Metformin     Unknown    Current Outpatient Prescriptions  Medication Sig Dispense Refill  . aspirin 81 MG  tablet Take 81 mg by mouth daily.      . Canagliflozin (INVOKANA) 300 MG TABS Take 1 tablet by mouth daily.    . Cyanocobalamin (VITAMIN B-12) 2500 MCG SUBL Place 1 tablet under the tongue daily.      Marland Kitchen dofetilide (TIKOSYN) 250 MCG capsule Take 250 mcg by mouth 2 (two) times daily.    . furosemide (LASIX) 40 MG tablet Take 20 mg by mouth daily.     . insulin detemir (LEVEMIR) 100 UNIT/ML injection Inject 26 Units into the skin at bedtime.    Marland Kitchen losartan (COZAAR) 100 MG tablet Take 100 mg by mouth daily.      . nitroGLYCERIN (NITROSTAT) 0.4 MG SL tablet Place 0.4 mg under the tongue every 5 (five) minutes as needed. For chest pain    . NOVOFINE 32G X 6 MM MISC   12  . Omega-3 Fatty Acids (FISH OIL) 1000 MG CAPS Take by mouth 2 (two) times daily.      Marland Kitchen PRADAXA 150 MG CAPS capsule TAKE 1 CAPSULE BY MOUTH TWICE DAILY 60 capsule 5  . simvastatin (ZOCOR) 40 MG tablet Take 40 mg by mouth at bedtime.      . carvedilol (COREG) 3.125 MG tablet Take 6.25 mg by mouth 2 (two) times daily with a meal.     . potassium chloride SA (K-DUR,KLOR-CON) 20 MEQ tablet Take 1 tablet (20 mEq total) by mouth daily. 30 tablet 12   No current facility-administered medications for this visit.    Review of Systems : See HPI for pertinent positives and negatives.  Physical Examination  Filed Vitals:   07/03/14 1340 07/03/14 1343  BP: 115/64 129/64  Pulse: 64 63  Resp:  16  Height:  '5\' 11"'$  (1.803 m)  Weight:  210 lb (95.255 kg)  SpO2:  94%   Body mass index is 29.3 kg/(m^2).  General: WDWN male in NAD GAIT: antalgic Eyes: PERRLA Pulmonary:  Non-labored, CTAB, Negative  Rales, Negative rhonchi, & Negative wheezing.  Cardiac: regular Rhythm, no detected murmur. Pacemaker/defibrillator left upper chest.  VASCULAR EXAM Carotid Bruits Right Left   Negative Negative    Aorta is not palpable. Radial pulses are 2+ palpable and equal.  LE Pulses Right Left       POPLITEAL  not palpable   not palpable       POSTERIOR TIBIAL   palpable    palpable        DORSALIS PEDIS      ANTERIOR TIBIAL  palpable   palpable     Gastrointestinal: soft, nontender, BS WNL, no r/g,  no palpated masses.  Musculoskeletal: Negative muscle atrophy/wasting. M/S 5/5 throughout except 4/5 left LE, Extremities without ischemic changes.  Neurologic: A&O X 3; Appropriate Affect,  Speech is normal CN 2-12 intact, Pain and light touch intact in extremities, Motor exam as listed above.   Non-Invasive Vascular Imaging CAROTID DUPLEX 07/03/2014   CEREBROVASCULAR DUPLEX EVALUATION    INDICATION: Carotid endarterectomy     PREVIOUS INTERVENTION(S): Left carotid endarterectomy on 03/04/07    DUPLEX EXAM:     RIGHT  LEFT  Peak Systolic Velocities (cm/s) End Diastolic Velocities (cm/s) Plaque LOCATION Peak Systolic Velocities (cm/s) End Diastolic Velocities (cm/s) Plaque  81 12  CCA PROXIMAL 94 14   80 12  CCA MID 98 22 HT  76 11 HT CCA DISTAL 202 23 HM  84 7  ECA 173 8   73 15 CP ICA PROXIMAL 86 17   85 19  ICA MID 86 22   75 19  ICA DISTAL 96 25     0.96 ICA / CCA Ratio (PSV) Not Calculated  Antegrade Vertebral Flow Antegrade  952 Brachial Systolic Pressure (mmHg) 841  Multiphasic (subclavian artery) Brachial Artery Waveforms Multiphasic (subclavian artery)    Plaque Morphology:  HM = Homogeneous, HT = Heterogeneous, CP = Calcific Plaque, SP = Smooth Plaque, IP = Irregular Plaque     ADDITIONAL FINDINGS: No significant stenosis of the bilateral external or right common carotid arteries.    IMPRESSION: 1. Patent left carotid endarterectomy site with no left internal carotid artery stenosis but Doppler velocities suggest a greater than 50% distal common carotid artery stenosis noted. 2. Doppler velocities suggest a 1-49% stenosis of the right proximal internal carotid artery.    Compared to  the previous exam:  No significant change noted when compared to the previous exam on 06/27/13.       Assessment: Karl [name] is a 79 y.o. male who is s/p left carotid endarterectomy on 03/04/07. He has no known history of stroke or TIA. Today's carotid Duplex suggests a patent left carotid endarterectomy site with no left internal carotid artery stenosis but Doppler velocities suggest a greater than 50% distal common carotid artery stenosis. Doppler velocities suggest a 1-49% stenosis of the right proximal internal carotid artery. No significant change noted when compared to the previous exam on 06/27/13.   Plan: Follow-up in 1 year with Carotid Duplex.   I discussed in depth with the patient the nature of atherosclerosis, and emphasized the importance of maximal medical management including strict control of blood pressure, blood glucose, and lipid levels, obtaining regular exercise, and continued cessation of smoking.  The patient is aware that without maximal medical management the underlying atherosclerotic disease process will progress, limiting the benefit of any interventions. The patient was given information about stroke prevention and what symptoms should prompt the patient to seek immediate medical care. Thank you for allowing [place] to participate in this patient's care.  Clemon Chambers, RN, MSN, FNP-C [facility] Office: [phone]  Clinic Physician: Kellie Simmering  07/03/2014 1:56 PM

## 2014-09-06 ENCOUNTER — Encounter: Payer: Self-pay | Admitting: Pulmonary Disease

## 2014-09-06 ENCOUNTER — Ambulatory Visit (INDEPENDENT_AMBULATORY_CARE_PROVIDER_SITE_OTHER): Payer: Medicare Other | Admitting: Pulmonary Disease

## 2014-09-06 VITALS — BP 138/66 | HR 66 | Ht 69.0 in | Wt 211.0 lb

## 2014-09-06 DIAGNOSIS — R042 Hemoptysis: Secondary | ICD-10-CM

## 2014-09-06 DIAGNOSIS — R911 Solitary pulmonary nodule: Secondary | ICD-10-CM

## 2014-09-06 DIAGNOSIS — J8410 Pulmonary fibrosis, unspecified: Secondary | ICD-10-CM | POA: Diagnosis not present

## 2014-09-06 NOTE — Patient Instructions (Signed)
We will arrange a CT scan for tomorrow and call you with the results I'm going to arrange a bronchoscopy at [facility] for Tuesday, August 9. Stop taking Pradaxa on Sunday, August 7 and hold it until after the procedure We will see you back in 2-3 weeks or sooner if needed

## 2014-09-06 NOTE — Progress Notes (Signed)
Subjective:    Patient ID: Karl [name], male    DOB: 1935-12-28, 79 y.o.   MRN: [id number]  Synopsis: Karl [name] first saw the [facility] office in the fall of 2014 for pulmonary fibrosis diagnosed in the setting of newly diagnosed rheumatoid arthritis.  CT scan findings were felt to be non-specific. He formerly smoked 1 ppd for 40 years.  HPI  Chief Complaint  Patient presents with  . Follow-up    last seen 2014- pt notes increased wheezing when laying down on L side.  also notes sob, prod cough with blood tinged mucus X1 week.     Karl [name] says he has been doing OK, but he says that recently he started having "problems in his chest".  He has noticed that he wheezes when he lays on his left side.  This doesn't happen when he lay on his right side.  He has been having pain on the left side of his chest for the last month.  For the last week he has been coughing up bloody mucus.  He recently spit up black appearing mucus once to or three times.  His weight has been stable. He has also had some shortness of breath with minimal activity like taking a shower.  He continues to take pradaxa.   Past Medical History  Diagnosis Date  . Coronary artery disease     status post CABG by Dr. Redmond Pulling in 1993 and status post percutaneous transluminal coronary angioplasty and stenting by Dr. Wynonia Lawman in 2002 and 2006  . Carotid artery occlusion     severe left internal carotid artery stenosis, asymptomatic status post carotid endarterectomy  . Hypertensive heart disease without CHF   . Carotid artery disease   . Hyperlipidemia   . ICD (implantable cardiac defibrillator) in place   . CHF (congestive heart failure)   . Myocardial infarction ~ 1992  . Anginal pain   . Pacemaker   . 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 H/O hiatal hernia     "gone after my bypass"  . Arthritis     "neck, back, knees"  . Ischemic cardiomyopathy   . Atrial fibrillation   . Rheumatoid  arthritis(714.0)      Review of Systems  Constitutional: Positive for fatigue. Negative for fever and chills.  HENT: Negative for congestion, nosebleeds, postnasal drip and rhinorrhea.   Respiratory: Positive for cough. Negative for shortness of breath and wheezing.   Cardiovascular: Positive for chest pain. Negative for palpitations and leg swelling.       Objective:   Physical Exam  Filed Vitals:   09/06/14 1600  BP: 138/66  Pulse: 66  Height: '5\' 9"'$  (1.753 m)  Weight: 211 lb (95.709 kg)  SpO2: 95%  RA  Gen: well appearing HEENT: NCAT, EOMi PULM: Few insp crackles throughout CV: RRR, no mgr AB: BS+, soft, nontender MSK: trace edema Neuro: Awake and alert  June 2014 full pulmonary function testing> ratio 86%, FEV1 2.91 L (96% predicted), no change with bronchodilator, total lung capacity 5.46 L (74% predicted) DLCO 15.09 (47% predicted) May 2014 CT chest>> there is no evidence of a pulmonary embolism, there is centrilobular emphysema seen throughout with significant paraseptal emphysema. There is also intralobular septal thickening and groundglass throughout the lungs and periphery in a fairly diffuse pattern. There no prior studies for further evaluation. 11/2012 CT Chest > no comment if progression from prior studies, persistent ground glass and interlobular septal thickening with emphysema; felt to  be consistent with NSIP or UIP      Assessment & Plan:   #1 Hemoptysis: This is a new problem, considering his smoking history quite concerned about this as his risk for malignancy is increased. On exam he has no abnormal findings aside from the crackles in the bases of his lungs which are chronic. The most common scenario and cause of hemoptysis is bronchitis but he has not had infectious symptoms recently. He had a pulmonary nodule last seen in 2014 but unfortunately he did not follow-up for his one-year visit last year.  Plan: CT chest Bronchoscopy for airway exam, this  will be arranged for next week  #2 Pulmonary nodule he had a small pulmonary nodule seen in the right upper lobe when he was here last, unfortunately he did not follow-up with his previously scheduled appointment last year. 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 This process can progress and would typically be treated with immunosuppression. He has been reluctant to treat it because he has been asymptomatic. Plan: We will see if there is progression on the repeat CT chest   Updated Medication List Outpatient Encounter Prescriptions as of 09/06/2014  Medication Sig  . aspirin 81 MG tablet Take 81 mg by mouth daily.    . Canagliflozin (INVOKANA) 300 MG TABS Take 1 tablet by mouth daily.  . carvedilol (COREG) 3.125 MG tablet Take 6.25 mg by mouth 2 (two) times daily with a meal.   . Cyanocobalamin (VITAMIN B-12) 2500 MCG SUBL Place 1 tablet under the tongue daily.    Marland Kitchen dofetilide (TIKOSYN) 250 MCG capsule Take 250 mcg by mouth 2 (two) times daily.  . furosemide (LASIX) 40 MG tablet Take 20 mg by mouth daily.   . insulin detemir (LEVEMIR) 100 UNIT/ML injection Inject 26 Units into the skin at bedtime.  Marland Kitchen losartan (COZAAR) 100 MG tablet Take 100 mg by mouth daily.    . nitroGLYCERIN (NITROSTAT) 0.4 MG SL tablet Place 0.4 mg under the tongue every 5 (five) minutes as needed. For chest pain  . NOVOFINE 32G X 6 MM MISC   . Omega-3 Fatty Acids (FISH OIL) 1000 MG CAPS Take by mouth 2 (two) times daily.    Marland Kitchen PRADAXA 150 MG CAPS capsule TAKE 1 CAPSULE BY MOUTH TWICE DAILY  . simvastatin (ZOCOR) 40 MG tablet Take 40 mg by mouth at bedtime.    . [DISCONTINUED] potassium chloride SA (K-DUR,KLOR-CON) 20 MEQ tablet Take 1 tablet (20 mEq total) by mouth daily.   No facility-administered encounter medications on file as of 09/06/2014.

## 2014-09-07 ENCOUNTER — Telehealth: Payer: Self-pay | Admitting: Pulmonary Disease

## 2014-09-07 ENCOUNTER — Ambulatory Visit (INDEPENDENT_AMBULATORY_CARE_PROVIDER_SITE_OTHER)
Admission: RE | Admit: 2014-09-07 | Discharge: 2014-09-07 | Disposition: A | Discharge status: Home / Self Care | Payer: Medicare Other | Source: Ambulatory Visit | Attending: Pulmonary Disease | Admitting: Pulmonary Disease

## 2014-09-07 DIAGNOSIS — R042 Hemoptysis: Secondary | ICD-10-CM

## 2014-09-07 NOTE — Telephone Encounter (Signed)
Pt scheduled for Tuesday at 11:30 at [facility] for [place].   Spoke with pt, he is aware of appt time.  Forwarding to BQ as FYI.

## 2014-09-07 NOTE — Telephone Encounter (Signed)
I called Karl [name] to let him know that his CT scan showed a large mass in the left upper lobe.  Unfortunately I could not reach him.  Our office has him scheduled for a bronchoscopy at [facility] in the morning next Tuesday.

## 2014-09-11 ENCOUNTER — Encounter (HOSPITAL_COMMUNITY): Payer: Self-pay | Admitting: Respiratory Therapy

## 2014-09-11 ENCOUNTER — Ambulatory Visit (HOSPITAL_COMMUNITY)
Admission: RE | Admit: 2014-09-11 | Discharge: 2014-09-11 | Disposition: A | Discharge status: Home / Self Care | Payer: Medicare Other | Source: Ambulatory Visit | Attending: Pulmonary Disease | Admitting: Pulmonary Disease

## 2014-09-11 ENCOUNTER — Encounter (HOSPITAL_COMMUNITY)
Admission: RE | Disposition: A | Discharge status: Home / Self Care | Payer: Self-pay | Source: Ambulatory Visit | Attending: Pulmonary Disease

## 2014-09-11 DIAGNOSIS — R918 Other nonspecific abnormal finding of lung field: Secondary | ICD-10-CM | POA: Diagnosis not present

## 2014-09-11 DIAGNOSIS — C3490 Malignant neoplasm of unspecified part of unspecified bronchus or lung: Secondary | ICD-10-CM

## 2014-09-11 DIAGNOSIS — E785 Hyperlipidemia, unspecified: Secondary | ICD-10-CM | POA: Insufficient documentation

## 2014-09-11 DIAGNOSIS — Z87891 Personal history of nicotine dependence: Secondary | ICD-10-CM | POA: Diagnosis not present

## 2014-09-11 DIAGNOSIS — I252 Old myocardial infarction: Secondary | ICD-10-CM | POA: Insufficient documentation

## 2014-09-11 DIAGNOSIS — M069 Rheumatoid arthritis, unspecified: Secondary | ICD-10-CM | POA: Diagnosis not present

## 2014-09-11 DIAGNOSIS — Z955 Presence of coronary angioplasty implant and graft: Secondary | ICD-10-CM | POA: Diagnosis not present

## 2014-09-11 DIAGNOSIS — R042 Hemoptysis: Secondary | ICD-10-CM

## 2014-09-11 DIAGNOSIS — C3410 Malignant neoplasm of upper lobe, unspecified bronchus or lung: Secondary | ICD-10-CM | POA: Insufficient documentation

## 2014-09-11 DIAGNOSIS — I2510 Atherosclerotic heart disease of native coronary artery without angina pectoris: Secondary | ICD-10-CM | POA: Diagnosis not present

## 2014-09-11 DIAGNOSIS — E119 Type 2 diabetes mellitus without complications: Secondary | ICD-10-CM | POA: Diagnosis not present

## 2014-09-11 HISTORY — PX: VIDEO BRONCHOSCOPY: SHX5072

## 2014-09-11 HISTORY — DX: Malignant neoplasm of unspecified part of unspecified bronchus or lung: C34.90

## 2014-09-11 HISTORY — DX: Presence of automatic (implantable) cardiac defibrillator: Z95.810

## 2014-09-11 LAB — CBC
HCT: 41.3 % (ref 39.0–52.0)
Hemoglobin: 13.9 g/dL (ref 13.0–17.0)
MCH: 31.6 pg (ref 26.0–34.0)
MCHC: 33.7 g/dL (ref 30.0–36.0)
MCV: 93.9 fL (ref 78.0–100.0)
Platelets: 211 10*3/uL (ref 150–400)
RBC: 4.4 MIL/uL (ref 4.22–5.81)
RDW: 13.2 % (ref 11.5–15.5)
WBC: 8.4 10*3/uL (ref 4.0–10.5)

## 2014-09-11 LAB — GLUCOSE, CAPILLARY: Glucose-Capillary: 171 mg/dL — ABNORMAL HIGH (ref 65–99)

## 2014-09-11 SURGERY — BRONCHOSCOPY, WITH FLUOROSCOPY
Anesthesia: Moderate Sedation | Laterality: Bilateral

## 2014-09-11 MED ORDER — MIDAZOLAM HCL 2 MG/2ML IJ SOLN: 1.0000 mg | Freq: Once | INTRAMUSCULAR | Status: DC

## 2014-09-11 MED ORDER — FENTANYL CITRATE (PF) 100 MCG/2ML IJ SOLN
INTRAMUSCULAR | Status: DC | PRN
  Administered 2014-09-11: 50 ug via INTRAVENOUS

## 2014-09-11 MED ORDER — LIDOCAINE HCL 2 % EX GEL
CUTANEOUS | Status: DC | PRN
  Administered 2014-09-11: 1

## 2014-09-11 MED ORDER — BUTAMBEN-TETRACAINE-BENZOCAINE 2-2-14 % EX AERO: 1.0000 | INHALATION_SPRAY | Freq: Once | CUTANEOUS | Status: DC

## 2014-09-11 MED ORDER — PHENYLEPHRINE HCL 0.25 % NA SOLN
1.0000 | Freq: Four times a day (QID) | NASAL | Status: DC | PRN
  Filled 2014-09-11: qty 15

## 2014-09-11 MED ORDER — LIDOCAINE HCL 2 % EX GEL: 1.0000 "application " | Freq: Once | CUTANEOUS | Status: DC

## 2014-09-11 MED ORDER — SODIUM CHLORIDE 0.9 % IV SOLN
INTRAVENOUS | Status: DC
  Administered 2014-09-11: 11:00:00 via INTRAVENOUS

## 2014-09-11 MED ORDER — MIDAZOLAM HCL 10 MG/2ML IJ SOLN
INTRAMUSCULAR | Status: DC | PRN
  Administered 2014-09-11: 2 mg via INTRAVENOUS

## 2014-09-11 MED ORDER — FENTANYL CITRATE (PF) 100 MCG/2ML IJ SOLN
INTRAMUSCULAR | Status: AC
  Filled 2014-09-11: qty 4

## 2014-09-11 MED ORDER — MIDAZOLAM HCL 5 MG/ML IJ SOLN
1.0000 mg | Freq: Once | INTRAMUSCULAR | Status: AC
  Administered 2014-09-11: 1 mg via INTRAVENOUS

## 2014-09-11 MED ORDER — MIDAZOLAM HCL 5 MG/ML IJ SOLN
INTRAMUSCULAR | Status: AC
  Filled 2014-09-11: qty 2

## 2014-09-11 MED ORDER — LIDOCAINE HCL (PF) 1 % IJ SOLN
INTRAMUSCULAR | Status: DC | PRN
  Administered 2014-09-11: 6 mL

## 2014-09-11 MED ORDER — PHENYLEPHRINE HCL 0.25 % NA SOLN
NASAL | Status: DC | PRN
  Administered 2014-09-11: 2 via NASAL

## 2014-09-11 NOTE — Discharge Instructions (Signed)
Flexible Bronchoscopy, Care After These instructions give you information on caring for yourself after your procedure. Your doctor may also give you more specific instructions. Call your doctor if you have any problems or questions after your procedure. HOME CARE  Do not eat or drink anything for 2 hours after your procedure. If you try to eat or drink before the medicine wears off, food or drink could go into your lungs. You could also burn yourself.  After 2 hours have passed and when you can cough and gag normally, you may eat soft food and drink liquids slowly.  The day after the test, you may eat your normal diet.  You may do your normal activities.  Keep all doctor visits. GET HELP RIGHT AWAY IF:  You get more and more short of breath.  You get light-headed.  You feel like you are going to pass out (faint).  You have chest pain.  You have new problems that worry you.  You cough up more than a little blood.  You cough up more blood than before. MAKE SURE YOU:  Understand these instructions.  Will watch your condition.  Will get help right away if you are not doing well or get worse. Document Released: 11/16/2008 Document Revised: 01/24/2013 Document Reviewed: 09/23/2012 [facility] Patient Information 2015 [place], [place]. This information is not intended to replace advice given to you by your health care provider. Make sure you discuss any questions you have with your health care provider.  Do not eat or drink until after 2:30 pm today 09/11/14

## 2014-09-11 NOTE — Progress Notes (Signed)
Video bronchoscopy performed Intervention bronchial washings Intervention bronchial brushings Intervention bronchial biopsies Pt tolerated well  Hilmer Aliberti David RRT  

## 2014-09-11 NOTE — H&P (Signed)
[facility] H&P  HPI: Karl [name] is my office patient who is a former smoker who has a lung mass with hemoptysis. I had followed him last in 2014 for NSIP.  He has been off pradaxa for > 2 days.  Past Medical History  Diagnosis Date  . Coronary artery disease     status post CABG by Dr. Redmond Pulling in 1993 and status post percutaneous transluminal coronary angioplasty and stenting by Dr. Wynonia Lawman in 2002 and 2006  . Carotid artery occlusion     severe left internal carotid artery stenosis, asymptomatic status post carotid endarterectomy  . Hypertensive heart disease without CHF   . Carotid artery disease   . Hyperlipidemia   . ICD (implantable cardiac defibrillator) in place   . CHF (congestive heart failure)   . Myocardial infarction ~ 1992  . Anginal pain   . Pacemaker   . 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 H/O hiatal hernia     "gone after my bypass"  . Arthritis     "neck, back, knees"  . Ischemic cardiomyopathy   . Atrial fibrillation   . Rheumatoid arthritis(714.0)   . AICD (automatic cardioverter/defibrillator) present      Family History  Problem Relation Age of Onset  . Diabetes Father   . Heart disease Father     Heart Disease before age 30  . Heart attack Father      History   Social History  . Marital Status: Married    Spouse Name: N/A  . Number of Children: N/A  . Years of Education: N/A   Occupational History  . Not on file.   Social History Main Topics  . Smoking status: Former Smoker -- 1.00 packs/day for 26 years    Types: Cigarettes    Quit date: 10/20/1980  . Smokeless tobacco: Former Systems developer    Types: Chew    Quit date: 04/02/2013  . Alcohol Use: 8.4 oz/week    14 Shots of liquor per week  . Drug Use: No  . Sexual Activity: Not Currently   Other Topics Concern  . Not on file   Social History Narrative     Allergies  Allergen Reactions  . Amiodarone Other (See Comments)    Tremor   . Rosiglitazone-Metformin    Unknown       Filed Vitals:   09/11/14 1115 09/11/14 1120 09/11/14 1125 09/11/14 1130  BP: 188/63 164/103 167/64 161/100  Pulse: 65 65 65 65  Temp:      TempSrc:      Resp: '18 16 16 12  '$ SpO2: 94% 93% 94% 97%   RA  Gen: well appearing HENT: OP clear, TM's clear, neck supple PULM: CTA B, normal percussion CV: RRR, no mgr, trace edema GI: BS+, soft, nontender Derm: no cyanosis or rash Psyche: normal mood and affect  CT images personally reviewed> large left upper lobe mass, stage 7 lymph node  CBC pending  Impression/Plan: Lung mass with hemoptysis> very worrisome for lung cancer, plan bronchoscopy with biopsy today  Roselie Awkward, MD [place] [facility] Pager: [phone] Cell: [phone] After 3pm or if no response, call [phone]

## 2014-09-11 NOTE — Op Note (Signed)
PCCM Video Bronchoscopy Procedure Note  The patient was informed of the risks (including but not limited to bleeding, infection, respiratory failure, lung injury, tooth/oral injury) and benefits of the procedure and gave consent, see chart.  Indication: hemoptysis, lung mass  Post Procedure Diagnosis: Endobronchial mass left upper lobe  Location: [facility]  Condition pre procedure: Stable  Medications for procedure: Versed '3mg'$ , Fentanyl 18mg IV, Lidocaine topically 1% 12 cc  Procedure description: The bronchoscope was introduced through the nose and passed to the bilateral lungs to the level of the subsegmental bronchi throughout the tracheobronchial tree.  Airway exam revealed normal appearing and functioning larynx, normal trachea and sharp carina.  The right tracheobronchial tree was normal with the exception of benign appearing but slightly friable mucosa with acathosis of the orifice of the RLL posterior basal segment.   The left mainstem was normal, the left lower lobe was normal with the exception of acathosis of the orifice of the LLL lateral and posterior basal segments, but no mass.  There was an infiltrative and friable mass in the left upper lobe partially occluding the airway.   Procedures performed:  1) washing left upper lobe 2) endobronchial brushing left upper lobe mass 3) endobronchial biopsy left upper lobe mass  Specimens sent:  1) Cytology> washing and brushing 2) Routine histology> endobronchial mass  Condition post procedure: Stable  EBL: < 133XO Complications: None immediate  BRoselie Awkward MD [facility] Pager: [phone]: [phone] 3pm or if no response, call [phone]

## 2014-09-13 ENCOUNTER — Other Ambulatory Visit: Payer: Self-pay | Admitting: Pulmonary Disease

## 2014-09-13 ENCOUNTER — Encounter (HOSPITAL_COMMUNITY): Payer: Self-pay | Admitting: Pulmonary Disease

## 2014-09-13 DIAGNOSIS — C3492 Malignant neoplasm of unspecified part of left bronchus or lung: Secondary | ICD-10-CM

## 2014-09-14 ENCOUNTER — Encounter: Payer: Self-pay | Admitting: *Deleted

## 2014-09-14 DIAGNOSIS — R918 Other nonspecific abnormal finding of lung field: Secondary | ICD-10-CM

## 2014-09-14 NOTE — Progress Notes (Signed)
Oncology Nurse Navigator Documentation  Oncology Nurse Navigator Flowsheets 09/14/2014  Referral date to RadOnc/MedOnc 09/13/2014  Navigator Encounter Type Other/I received a referral yesterday on Karl [name].  I have notified HIM dept to schedule patient to see Dr. Julien Nordmann on 09/17/14 at 2:15.    Treatment Phase Abnormal Scans  Interventions Coordination of Care  Coordination of Care MD Appointments  Time Spent with Patient 15

## 2014-09-17 ENCOUNTER — Telehealth: Payer: Self-pay | Admitting: Medical Oncology

## 2014-09-17 ENCOUNTER — Telehealth: Payer: Self-pay | Admitting: Pulmonary Disease

## 2014-09-17 ENCOUNTER — Other Ambulatory Visit: Payer: Self-pay | Admitting: Medical Oncology

## 2014-09-17 NOTE — Telephone Encounter (Signed)
Received message from Dr. Worthy Flank office, they advised me that they contacted patient. Called patient to confirm appointment. Patient confirmed that he received notice of appointment. Nothing further needed.

## 2014-09-17 NOTE — Telephone Encounter (Signed)
RECEIVED A CALL FROM DR.MCQUAID'S OFFICE, MICHELLE CONCERNING AN APPOINTMENT FOR PT., Karl [name]. NOTIFIED DR.MOHAMED'S NURSE, DIANE BELL,RN. SHE WILL CONTACT PT.

## 2014-09-17 NOTE — Telephone Encounter (Signed)
Patient says that Dr. Lake Bells called him last week and told him the tumor in his left lung was malignant and that the Cancer center will contact him in 24 hours.   He has not heard from cancer center.   Notes say that patient needs to be scheduled today with Dr. Julien Nordmann at 2:45.  Called Valrie Hart, RN at [facility] to let her know that patient has not been notified of this appointment. Patient can be reached at [phone] this afternoon.   Spoke with triage dept and they told me that the referral was sent to Medical Records instead of the referral dept.   She is routing the referral to the correct dept. Attempted to contact patient, busy x 2

## 2014-09-17 NOTE — Telephone Encounter (Signed)
Per Dr Julien Nordmann , pt scheduled to see him this Friday aug 19. Pt and Dr Lake Bells notified of lab and MD appt.  Karl [name]

## 2014-09-20 ENCOUNTER — Telehealth: Payer: Self-pay | Admitting: *Deleted

## 2014-09-20 NOTE — Telephone Encounter (Signed)
Oncology Nurse Navigator Documentation  Oncology Nurse Navigator Flowsheets 09/20/2014  Navigator Encounter Type Telephone/I called to follow up with patient about appt.  Patient is aware of appt time and place.   Patient Visit Type Follow-up  Treatment Phase Abnormal scans  Interventions Coordination of Care  Coordination of Care MD Appointments  Time Spent with Patient 15

## 2014-09-21 ENCOUNTER — Other Ambulatory Visit (HOSPITAL_BASED_OUTPATIENT_CLINIC_OR_DEPARTMENT_OTHER): Payer: Medicare Other

## 2014-09-21 ENCOUNTER — Ambulatory Visit (HOSPITAL_BASED_OUTPATIENT_CLINIC_OR_DEPARTMENT_OTHER): Payer: Medicare Other | Admitting: Internal Medicine

## 2014-09-21 ENCOUNTER — Telehealth: Payer: Self-pay | Admitting: Internal Medicine

## 2014-09-21 ENCOUNTER — Telehealth: Payer: Self-pay | Admitting: *Deleted

## 2014-09-21 ENCOUNTER — Encounter: Payer: Self-pay | Admitting: Internal Medicine

## 2014-09-21 ENCOUNTER — Ambulatory Visit: Payer: Medicare Other

## 2014-09-21 VITALS — BP 143/51 | HR 64 | Temp 97.9°F | Resp 16 | Ht 69.0 in | Wt 205.6 lb

## 2014-09-21 DIAGNOSIS — C3492 Malignant neoplasm of unspecified part of left bronchus or lung: Secondary | ICD-10-CM | POA: Insufficient documentation

## 2014-09-21 DIAGNOSIS — R634 Abnormal weight loss: Secondary | ICD-10-CM

## 2014-09-21 DIAGNOSIS — C3412 Malignant neoplasm of upper lobe, left bronchus or lung: Secondary | ICD-10-CM

## 2014-09-21 DIAGNOSIS — I2510 Atherosclerotic heart disease of native coronary artery without angina pectoris: Secondary | ICD-10-CM | POA: Diagnosis not present

## 2014-09-21 DIAGNOSIS — R918 Other nonspecific abnormal finding of lung field: Secondary | ICD-10-CM

## 2014-09-21 LAB — CBC WITH DIFFERENTIAL/PLATELET
BASO%: 0.3 % (ref 0.0–2.0)
BASOS ABS: 0 10*3/uL (ref 0.0–0.1)
EOS%: 3.9 % (ref 0.0–7.0)
Eosinophils Absolute: 0.3 10*3/uL (ref 0.0–0.5)
HEMATOCRIT: 40.6 % (ref 38.4–49.9)
HEMOGLOBIN: 13.8 g/dL (ref 13.0–17.1)
LYMPH#: 2.3 10*3/uL (ref 0.9–3.3)
LYMPH%: 30.3 % (ref 14.0–49.0)
MCH: 31.3 pg (ref 27.2–33.4)
MCHC: 34 g/dL (ref 32.0–36.0)
MCV: 92.1 fL (ref 79.3–98.0)
MONO#: 0.8 10*3/uL (ref 0.1–0.9)
MONO%: 10.3 % (ref 0.0–14.0)
NEUT#: 4.2 10*3/uL (ref 1.5–6.5)
NEUT%: 55.2 % (ref 39.0–75.0)
PLATELETS: 230 10*3/uL (ref 140–400)
RBC: 4.41 10*6/uL (ref 4.20–5.82)
RDW: 13.4 % (ref 11.0–14.6)
WBC: 7.7 10*3/uL (ref 4.0–10.3)

## 2014-09-21 LAB — COMPREHENSIVE METABOLIC PANEL (CC13)
ALBUMIN: 3.6 g/dL (ref 3.5–5.0)
ALK PHOS: 97 U/L (ref 40–150)
ALT: 20 U/L (ref 0–55)
AST: 18 U/L (ref 5–34)
Anion Gap: 10 mEq/L (ref 3–11)
BILIRUBIN TOTAL: 0.42 mg/dL (ref 0.20–1.20)
BUN: 13.1 mg/dL (ref 7.0–26.0)
CALCIUM: 9.9 mg/dL (ref 8.4–10.4)
CO2: 22 mEq/L (ref 22–29)
CREATININE: 1.1 mg/dL (ref 0.7–1.3)
Chloride: 108 mEq/L (ref 98–109)
EGFR: 64 mL/min/{1.73_m2} — ABNORMAL LOW (ref >90)
Glucose: 183 mg/dl — ABNORMAL HIGH (ref 70–140)
Potassium: 4.6 mEq/L (ref 3.5–5.1)
Sodium: 139 mEq/L (ref 136–145)
Total Protein: 7.8 g/dL (ref 6.4–8.3)

## 2014-09-21 NOTE — Telephone Encounter (Signed)
per pof to sch pt appt-sent MW email to sch pt trmt-gave pt copy of avs-pt to get updated copy on 8/24-

## 2014-09-21 NOTE — Progress Notes (Signed)
[place] Telephone:([phone]   Fax:([phone]  CONSULT NOTE  REFERRING PHYSICIAN: Dr. Simonne Maffucci  REASON FOR CONSULTATION:  79 years old white male recently diagnosed with lung cancer.  HPI Karl [name] is a 79 y.o. male with past medical history significant for diabetes mellitus, dyslipidemia, congestive heart failure, coronary artery disease as well as ischemic cardiomyopathy and rheumatoid lung disease. The patient has a remote history of smoking but quit more than 40 years ago. He has been followed closely by Dr. Lake Bells for his rheumatoid lung disease. The patient has been complaining of persistent cough for 2-3 weeks. By the end of the third week his cough was associated with mild hemoptysis. He also had mild wheezes when he sleeps on the left side. He presented to Dr. Lake Bells for evaluation and CT of the chest was performed on 09/07/2014 and it showed pleural-based 6.1 cm left upper lobe mass highly suspicious for primary bronchogenic malignancy, with likely visceral and parietal pleural invasion and possibly chest wall invasion between the first and second ribs, likely T3 disease. There was numerous conference satellite nodules throughout the left upper lobe suspicious for ipsilateral metastasis within the same lobe. There was extensive left hilar and mediastinal lymphadenopathy including a lower right paratracheal pathologic nodes suspicious for in situ the metastatic nodal disease. On 09/11/2014 the patient underwent a video bronchoscopy under the care of Dr. Curt Jews with biopsy of the left upper lobe mass. The final pathology (Accession: [id number]) was consistent with a small cell carcinoma.  The patient was referred to me today for evaluation and recommendation regarding treatment of his condition. When seen today the patient continues to complain of lack of energy as well as shortness of breath and mild left-sided chest pain with radiation to the  back. He also has a weight loss of around 10-12 pounds over the last few weeks. He denied having any significant hemoptysis. The patient denied having any significant nausea or vomiting and no change in his bowel movement. The patient has headache mainly on the back of his neck. Family history significant for mother who died from old Asian father had heart disease. He has a sister with colon cancer.  The patient is married and has 2 children. He was accompanied today by his wife Karl [name] and his grandson Karl [name]. He used to work in [employer]. He has a history of smoking for around 25 years but quit 40 years ago. He drinks alcohol occasionally and no history of drug abuse. HPI  Past Medical History  Diagnosis Date  . Coronary artery disease     status post CABG by Dr. Redmond Pulling in 1993 and status post percutaneous transluminal coronary angioplasty and stenting by Dr. Wynonia Lawman in 2002 and 2006  . Carotid artery occlusion     severe left internal carotid artery stenosis, asymptomatic status post carotid endarterectomy  . Hypertensive heart disease without CHF   . Carotid artery disease   . Hyperlipidemia   . ICD (implantable cardiac defibrillator) in place   . CHF (congestive heart failure)   . Myocardial infarction ~ 1992  . Anginal pain   . Pacemaker   . 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 H/O hiatal hernia     "gone after my bypass"  . Arthritis     "neck, back, knees"  . Ischemic cardiomyopathy   . Atrial fibrillation   . Rheumatoid arthritis(714.0)   . AICD (automatic cardioverter/defibrillator) present  Past Surgical History  Procedure Laterality Date  . Carotid endarterectomy  2009    left  . Cardiac defibrillator placement  05/2006    St Jude   . Eye surgery  March 2013    Cataract Left eye  . Eye surgery  06/08/2011    Cataract Right eye  . Pr vein bypass graft,aorto-fem-pop    . Inguinal hernia repair  1970's    bilaterally  . Hemorrhoid surgery   1970's  . Coronary angioplasty with stent placement      "I've got 2" (08/10/11)  . Cardioversion  08/2010  . Insert / replace / remove pacemaker  05/2006    "got a defibrillator/pacemaker" (08/10/11)  . Coronary artery bypass graft  08/1991    CABG X5  . Cardioversion  08/13/2011    Procedure: CARDIOVERSION;  Surgeon: Jacolyn Reedy, MD;  Location: [facility];  Service: Cardiovascular;  Laterality: N/A;  . Left heart catheterization with coronary/graft angiogram N/A 06/16/2012    Procedure: LEFT HEART CATHETERIZATION WITH Beatrix Fetters;  Surgeon: Jacolyn Reedy, MD;  Location: [facility];  Service: Cardiovascular;  Laterality: N/A;  . Video bronchoscopy Bilateral 09/11/2014    Procedure: VIDEO BRONCHOSCOPY WITH FLUORO;  Surgeon: Juanito Doom, MD;  Location: [place];  Service: Cardiopulmonary;  Laterality: Bilateral;    Family History  Problem Relation Age of Onset  . Diabetes Father   . Heart disease Father     Heart Disease before age 40  . Heart attack Father     Social History Social History  Substance Use Topics  . Smoking status: Former Smoker -- 1.00 packs/day for 26 years    Types: Cigarettes    Quit date: 10/20/1980  . Smokeless tobacco: Former Systems developer    Types: Chew    Quit date: 04/02/2013  . Alcohol Use: 8.4 oz/week    14 Shots of liquor per week    Allergies  Allergen Reactions  . Amiodarone Other (See Comments)    Tremor   . Rosiglitazone-Metformin     Unknown    Current Outpatient Prescriptions  Medication Sig Dispense Refill  . aspirin 81 MG tablet Take 81 mg by mouth daily.      . Canagliflozin (INVOKANA) 300 MG TABS Take 1 tablet by mouth daily.    . carvedilol (COREG) 3.125 MG tablet Take 6.25 mg by mouth 2 (two) times daily with a meal.     . Cyanocobalamin (VITAMIN B-12) 2500 MCG SUBL Place 1 tablet under the tongue daily.      Marland Kitchen dofetilide (TIKOSYN) 250 MCG capsule Take 250 mcg by mouth 2 (two) times daily.    . furosemide (LASIX) 40  MG tablet Take 20 mg by mouth daily.     Marland Kitchen HUMALOG KWIKPEN 100 UNIT/ML KiwkPen Inject 4 Units into the skin daily.  6  . losartan (COZAAR) 100 MG tablet Take 100 mg by mouth daily.      Marland Kitchen NOVOFINE 32G X 6 MM MISC   12  . Omega-3 Fatty Acids (FISH OIL) 1000 MG CAPS Take by mouth 2 (two) times daily.      Marland Kitchen PRADAXA 150 MG CAPS capsule TAKE 1 CAPSULE BY MOUTH TWICE DAILY 60 capsule 5  . simvastatin (ZOCOR) 40 MG tablet Take 40 mg by mouth at bedtime.      . insulin detemir (LEVEMIR) 100 UNIT/ML injection Inject 26 Units into the skin at bedtime.    . nitroGLYCERIN (NITROSTAT) 0.4 MG SL tablet Place 0.4 mg  under the tongue every 5 (five) minutes as needed. For chest pain     No current facility-administered medications for this visit.    Review of Systems  Constitutional: positive for anorexia, fatigue and weight loss Eyes: negative Ears, nose, mouth, throat, and face: negative Respiratory: positive for cough, dyspnea on exertion, pleurisy/chest pain and sputum Cardiovascular: negative Gastrointestinal: negative Genitourinary:negative Integument/breast: negative Hematologic/lymphatic: negative Musculoskeletal:negative Neurological: positive for headaches Behavioral/Psych: negative Endocrine: negative Allergic/Immunologic: negative  Physical Exam  ALP:FXTKW, healthy, no distress, well nourished and well developed SKIN: skin color, texture, turgor are normal, no rashes or significant lesions HEAD: Normocephalic, No masses, lesions, tenderness or abnormalities EYES: normal, PERRLA, Conjunctiva are pink and non-injected EARS: External ears normal, Canals clear OROPHARYNX:no exudate, no erythema and lips, buccal mucosa, and tongue normal  NECK: supple, no adenopathy, no JVD LYMPH:  no palpable lymphadenopathy, no hepatosplenomegaly LUNGS: prolonged expiratory phase, expiratory wheezes bilaterally HEART: regular rate & rhythm, no murmurs and no gallops ABDOMEN:abdomen soft,  non-tender, normal bowel sounds and no masses or organomegaly BACK: Back symmetric, no curvature., No CVA tenderness EXTREMITIES:no joint deformities, effusion, or inflammation, no edema, no clubbing  NEURO: alert & oriented x 3 with fluent speech, no focal motor/sensory deficits  PERFORMANCE STATUS: ECOG 1  LABORATORY DATA: Lab Results  Component Value Date   WBC 7.7 09/21/2014   HGB 13.8 09/21/2014   HCT 40.6 09/21/2014   MCV 92.1 09/21/2014   PLT 230 09/21/2014      Chemistry      Component Value Date/Time   NA 139 09/21/2014 0920   NA 138 08/13/2011 0535   K 4.6 09/21/2014 0920   K 3.8 08/13/2011 0535   CL 100 08/13/2011 0535   CO2 22 09/21/2014 0920   CO2 27 08/13/2011 0535   BUN 13.1 09/21/2014 0920   BUN 19 08/13/2011 0535   CREATININE 1.1 09/21/2014 0920   CREATININE 1.15 08/13/2011 0535      Component Value Date/Time   CALCIUM 9.9 09/21/2014 0920   CALCIUM 9.6 08/13/2011 0535   ALKPHOS 97 09/21/2014 0920   ALKPHOS 93 08/10/2011 1211   AST 18 09/21/2014 0920   AST 18 08/10/2011 1211   ALT 20 09/21/2014 0920   ALT 24 08/10/2011 1211   BILITOT 0.42 09/21/2014 0920   BILITOT 0.4 08/10/2011 1211       RADIOGRAPHIC STUDIES: Ct Chest Wo Contrast  09/07/2014   CLINICAL DATA:  79 year old male former smoker (quit in 1975) with intermittent hemoptysis, chest pain and shortness of breath for 1.5 weeks.  EXAM: CT CHEST WITHOUT CONTRAST  TECHNIQUE: Multidetector CT imaging of the chest was performed following the standard protocol without IV contrast.  COMPARISON:  Most recent chest CT from 11/30/2012.  FINDINGS: Mediastinum/Nodes: Stable mild cardiomegaly. Well-positioned single lead left subclavian ICD with lead tip in the right ventricular apex. There is atherosclerosis of the thoracic aorta, the great vessels of the mediastinum and the coronary arteries, including calcified atherosclerotic plaque in the left main, left anterior descending, left circumflex and right  coronary arteries. Post CABG changes are noted with left internal mammary and ascending aortic grafts, which are not evaluated on this noncontrast study. Great vessels are normal in course and caliber. No central pulmonary emboli. Normal visualized thyroid. Normal esophagus. No axillary lymphadenopathy. There is new extensive mediastinal lymphadenopathy. No supraclavicular lymphadenopathy is seen. A mildly enlarged 1.6 cm lower right paratracheal node (series 2/image 24) is increased from 1.3 cm on 11/30/2012. There are multiple new mildly to  moderately enlarged prevascular and aortopulmonary window left mediastinal lymph nodes, including a 2.6 cm high left mediastinal node (2/13), a 1.9 cm anterior left prevascular node (2/20) and a 1.6 cm aortopulmonary window node (2/23). There is a mildly enlarged 1.0 cm short axis right anterior mediastinal node (2/20). There is a mildly enlarged 1.5 cm short axis subcarinal node (2/30). There is extensive left hilar lymphadenopathy, which is confluent with the left upper lobe masses, with the largest left hilar node measuring approximately 4.0 cm short axis (2/20). Multiple nonenlarged coarsely calcified paratracheal, subcarinal and bilateral hilar lymph nodes are unchanged from the prior chest CT, in keeping with prior granulomatous disease. No right hilar adenopathy is appreciated, noting limited sensitivity for the detection of hilar adenopathy on a noncontrast study.  Lungs/Pleura: No pneumothorax. No pleural effusion. There is a new peripheral pleural-based left upper lobe 6.1 x 5.3 cm mass (3/17), which demonstrates direct extension into the interspace between the anterior left first and second ribs (2/13). There are multiple confluent central/para-mediastinal left upper lobe pulmonary nodules of various size, measuring 10.4 x 5.1 cm in conglomerate (series 3/image 25), which is confluent with the infiltrative left hilar lymphadenopathy, which may invade the mediastinum  (2/26), and which demonstrates encasement and extrinsic narrowing of the associated left upper lobe segmental bronchus, suspicious for confluent satellite left upper lobe tumors. There is patchy ground-glass opacity in the left upper lobe surrounding the masses. There are separate 0.7 cm and 0.7 cm pulmonary nodules in the lingula (3/32 and 3/33). Re- demonstrated are diffuse peripheral predominant changes of subpleural reticulation, architectural distortion, traction bronchiectasis and mild ground-glass component, not appreciably changed since 11/30/2012, most suggestive of fibrotic phase nonspecific interstitial pneumonia (NSIP). No significant pulmonary nodules are detected in the right lung.  Upper abdomen: Unremarkable.  Musculoskeletal: No suspicious focal osseous lesions. Moderate to marked degenerative changes in the thoracic spine. Median sternotomy wires appear intact.  IMPRESSION: 1. Pleural-based 6.1 cm left upper lobe mass, highly suspicious for a primary bronchogenic malignancy, with likely visceral and parietal pleural invasion and possible chest wall invasion between the first and second ribs, likely T3 disease. Numerous confluent satellite nodules throughout the left upper lobe, suspicious for ipsilateral metastases within the same lobe. These masses are likely amenable to tissue sampling either via bronchoscopic or CT-guided percutaneous biopsy approach. 2. Extensive left hilar and mediastinal lymphadenopathy as described, including a lower right paratracheal pathologic node, suspicious for N3 metastatic nodal disease. Overall, the chest CT findings are suspicious for at least stage IIIB lung cancer (T3N3Mx). 3. Patchy ground-glass opacity in the left upper lobe surrounding the masses, nonspecific, possibly postobstructive pneumonitis or peri-tumoral hemorrhage. 4. 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 5.  Atherosclerosis, including left main and 3 vessel coronary artery disease, status post CABG.  These results were called by telephone at the time of interpretation on 09/07/2014 at 12:55 pm to Dr. Simonne Maffucci , who verbally acknowledged these results.   Electronically Signed   By: Ilona Sorrel M.D.   On: 09/07/2014 13:08    ASSESSMENT: This is a very pleasant 79 years old white male recently diagnosed with limited stage (T3, N3, M0) small cell lung cancer, pending further staging workup, diagnosed in August 2016, presented with large left upper lobe lung mass in addition to mediastinal lymphadenopathy.   PLAN: I had a lengthy discussion with the patient and his family today about his current disease stage, prognosis and treatment options. I  recommended for the patient to complete the staging workup by ordering a PET scan in addition to CT scan of the head to rule out metastatic disease. The patient cannot have an MRI of the brain because of his pacemaker. I discussed with the patient his treatment options including systemic chemotherapy with carboplatin for AUC of 5 on day 1 and etoposide 120 MG/M2 on days 1, 2 and 3 every 3 weeks. His treatment will be concurrent with radiotherapy if there is no evidence of metastatic disease outside the chest. I discussed with the patient adverse effect of the chemotherapy including but not limited to alopecia, myelosuppression, nausea and vomiting, peripheral neuropathy, liver or renal dysfunction. He is expected to start the first dose of his systemic chemotherapy on 10/01/2014 after completing the staging workup. He would have a chemotherapy education class before starting the first dose of his treatment. I will call his pharmacy with prescription for Compazine 10 mg by mouth every 6 hours as needed for nausea. The patient would come back for follow-up visit in 2 weeks for reevaluation and management of any adverse effect of his treatment. He was advised to call  immediately if he has any concerning symptoms in the interval. The patient voices understanding of current disease status and treatment options and is in agreement with the current care plan.  All questions were answered. The patient knows to call the clinic with any problems, questions or concerns. We can certainly see the patient much sooner if necessary.  Thank you so much for allowing me to participate in the care of JAYKOB MINICHIELLO. I will continue to follow up the patient with you and assist in his care.  I spent 55 minutes counseling the patient face to face. The total time spent in the appointment was 80 minutes.  Disclaimer: This note was dictated with voice recognition software. Similar sounding words can inadvertently be transcribed and may not be corrected upon review.   Khaleed Holan K. September 21, 2014, 10:22 AM

## 2014-09-21 NOTE — Telephone Encounter (Signed)
Per staff message and POF I have scheduled appts. Advised scheduler of appts. JMW  

## 2014-09-21 NOTE — Progress Notes (Signed)
Checked in new patient for cancer concern. Pt made copay of $35. Receipt given. Pt given my card for any questions or concerns regarding financial assistance and grants.

## 2014-09-25 ENCOUNTER — Other Ambulatory Visit: Payer: Self-pay | Admitting: Internal Medicine

## 2014-09-25 ENCOUNTER — Encounter (HOSPITAL_COMMUNITY): Payer: Self-pay

## 2014-09-25 ENCOUNTER — Ambulatory Visit (HOSPITAL_COMMUNITY)
Admission: RE | Admit: 2014-09-25 | Discharge: 2014-09-25 | Disposition: A | Discharge status: Home / Self Care | Payer: Medicare Other | Source: Ambulatory Visit | Attending: Internal Medicine | Admitting: Internal Medicine

## 2014-09-25 DIAGNOSIS — G9389 Other specified disorders of brain: Secondary | ICD-10-CM | POA: Insufficient documentation

## 2014-09-25 DIAGNOSIS — Z8673 Personal history of transient ischemic attack (TIA), and cerebral infarction without residual deficits: Secondary | ICD-10-CM | POA: Diagnosis not present

## 2014-09-25 DIAGNOSIS — C3492 Malignant neoplasm of unspecified part of left bronchus or lung: Secondary | ICD-10-CM | POA: Insufficient documentation

## 2014-09-25 MED ORDER — IOHEXOL 300 MG/ML  SOLN
100.0000 mL | Freq: Once | INTRAMUSCULAR | Status: AC | PRN
  Administered 2014-09-25: 100 mL via INTRAVENOUS

## 2014-09-26 ENCOUNTER — Other Ambulatory Visit: Payer: Medicare Other

## 2014-09-26 ENCOUNTER — Encounter: Payer: Self-pay | Admitting: *Deleted

## 2014-09-28 ENCOUNTER — Ambulatory Visit (HOSPITAL_COMMUNITY)
Admission: RE | Admit: 2014-09-28 | Discharge: 2014-09-28 | Disposition: A | Discharge status: Home / Self Care | Payer: Medicare Other | Source: Ambulatory Visit | Attending: Internal Medicine | Admitting: Internal Medicine

## 2014-09-28 DIAGNOSIS — C3492 Malignant neoplasm of unspecified part of left bronchus or lung: Secondary | ICD-10-CM | POA: Diagnosis present

## 2014-09-28 LAB — GLUCOSE, CAPILLARY: GLUCOSE-CAPILLARY: 127 mg/dL — AB (ref 65–99)

## 2014-09-28 MED ORDER — FLUDEOXYGLUCOSE F - 18 (FDG) INJECTION
9.9800 | Freq: Once | INTRAVENOUS | Status: DC | PRN
  Administered 2014-09-28: 9.98 via INTRAVENOUS
  Filled 2014-09-28: qty 9.98

## 2014-10-01 ENCOUNTER — Ambulatory Visit (HOSPITAL_BASED_OUTPATIENT_CLINIC_OR_DEPARTMENT_OTHER): Payer: Medicare Other

## 2014-10-01 ENCOUNTER — Telehealth: Payer: Self-pay | Admitting: Medical Oncology

## 2014-10-01 ENCOUNTER — Other Ambulatory Visit (HOSPITAL_BASED_OUTPATIENT_CLINIC_OR_DEPARTMENT_OTHER): Payer: Medicare Other

## 2014-10-01 ENCOUNTER — Other Ambulatory Visit: Payer: Self-pay | Admitting: Medical Oncology

## 2014-10-01 ENCOUNTER — Other Ambulatory Visit: Payer: Self-pay | Admitting: Physician Assistant

## 2014-10-01 VITALS — BP 149/60 | HR 71 | Temp 97.0°F

## 2014-10-01 DIAGNOSIS — C3492 Malignant neoplasm of unspecified part of left bronchus or lung: Secondary | ICD-10-CM

## 2014-10-01 DIAGNOSIS — C3412 Malignant neoplasm of upper lobe, left bronchus or lung: Secondary | ICD-10-CM

## 2014-10-01 DIAGNOSIS — R112 Nausea with vomiting, unspecified: Secondary | ICD-10-CM

## 2014-10-01 DIAGNOSIS — Z5111 Encounter for antineoplastic chemotherapy: Secondary | ICD-10-CM

## 2014-10-01 DIAGNOSIS — T451X5A Adverse effect of antineoplastic and immunosuppressive drugs, initial encounter: Principal | ICD-10-CM

## 2014-10-01 DIAGNOSIS — C3490 Malignant neoplasm of unspecified part of unspecified bronchus or lung: Secondary | ICD-10-CM

## 2014-10-01 LAB — COMPREHENSIVE METABOLIC PANEL (CC13)
ALT: 17 U/L (ref 0–55)
ANION GAP: 9 meq/L (ref 3–11)
AST: 18 U/L (ref 5–34)
Albumin: 3.4 g/dL — ABNORMAL LOW (ref 3.5–5.0)
Alkaline Phosphatase: 108 U/L (ref 40–150)
BUN: 12.4 mg/dL (ref 7.0–26.0)
CALCIUM: 9.7 mg/dL (ref 8.4–10.4)
CHLORIDE: 109 meq/L (ref 98–109)
CO2: 21 mEq/L — ABNORMAL LOW (ref 22–29)
CREATININE: 1 mg/dL (ref 0.7–1.3)
EGFR: 69 mL/min/{1.73_m2} — ABNORMAL LOW (ref >90)
Glucose: 265 mg/dl — ABNORMAL HIGH (ref 70–140)
POTASSIUM: 4.2 meq/L (ref 3.5–5.1)
Sodium: 139 mEq/L (ref 136–145)
Total Bilirubin: 0.59 mg/dL (ref 0.20–1.20)
Total Protein: 7.6 g/dL (ref 6.4–8.3)

## 2014-10-01 LAB — CBC WITH DIFFERENTIAL/PLATELET
BASO%: 0.6 % (ref 0.0–2.0)
BASOS ABS: 0 10*3/uL (ref 0.0–0.1)
EOS%: 4.3 % (ref 0.0–7.0)
Eosinophils Absolute: 0.3 10*3/uL (ref 0.0–0.5)
HEMATOCRIT: 40.4 % (ref 38.4–49.9)
HGB: 13.6 g/dL (ref 13.0–17.1)
LYMPH#: 1.9 10*3/uL (ref 0.9–3.3)
LYMPH%: 24 % (ref 14.0–49.0)
MCH: 30.6 pg (ref 27.2–33.4)
MCHC: 33.7 g/dL (ref 32.0–36.0)
MCV: 91 fL (ref 79.3–98.0)
MONO#: 0.6 10*3/uL (ref 0.1–0.9)
MONO%: 7.9 % (ref 0.0–14.0)
NEUT#: 4.9 10*3/uL (ref 1.5–6.5)
NEUT%: 63.2 % (ref 39.0–75.0)
PLATELETS: 261 10*3/uL (ref 140–400)
RBC: 4.44 10*6/uL (ref 4.20–5.82)
RDW: 13.4 % (ref 11.0–14.6)
WBC: 7.8 10*3/uL (ref 4.0–10.3)

## 2014-10-01 LAB — MAGNESIUM (CC13): MAGNESIUM: 2.2 mg/dL (ref 1.5–2.5)

## 2014-10-01 MED ORDER — LORAZEPAM 0.5 MG PO TABS: 0.5000 mg | ORAL_TABLET | Freq: Four times a day (QID) | ORAL | Status: DC | PRN

## 2014-10-01 MED ORDER — FOSAPREPITANT DIMEGLUMINE INJECTION 150 MG
Freq: Once | INTRAVENOUS | Status: AC
  Administered 2014-10-01: 11:00:00 via INTRAVENOUS
  Filled 2014-10-01: qty 5

## 2014-10-01 MED ORDER — SODIUM CHLORIDE 0.9 % IV SOLN
120.0000 mg/m2 | Freq: Once | INTRAVENOUS | Status: AC
  Administered 2014-10-01: 260 mg via INTRAVENOUS
  Filled 2014-10-01: qty 13

## 2014-10-01 MED ORDER — SODIUM CHLORIDE 0.9 % IV SOLN: Freq: Once | INTRAVENOUS | Status: DC

## 2014-10-01 MED ORDER — ONDANSETRON HCL 4 MG PO TABS: 4.0000 mg | ORAL_TABLET | Freq: Three times a day (TID) | ORAL | Status: DC | PRN

## 2014-10-01 MED ORDER — SODIUM CHLORIDE 0.9 % IV SOLN
Freq: Once | INTRAVENOUS | Status: AC
  Administered 2014-10-01: 10:00:00 via INTRAVENOUS

## 2014-10-01 MED ORDER — SODIUM CHLORIDE 0.9 % IV SOLN
502.0000 mg | Freq: Once | INTRAVENOUS | Status: AC
  Administered 2014-10-01: 500 mg via INTRAVENOUS
  Filled 2014-10-01: qty 50

## 2014-10-01 NOTE — Telephone Encounter (Signed)
Called in ativan

## 2014-10-01 NOTE — Patient Instructions (Addendum)
[facility] Discharge Instructions for Patients Receiving Chemotherapy  Today you received the following chemotherapy agents: Carboplatin and Etoposide.  To help prevent nausea and vomiting after your treatment, we encourage you to take your nausea medication: as directed.   If you develop nausea and vomiting that is not controlled by your nausea medication, call the clinic.   BELOW ARE SYMPTOMS THAT SHOULD BE REPORTED IMMEDIATELY:  *FEVER GREATER THAN 100.5 F  *CHILLS WITH OR WITHOUT FEVER  NAUSEA AND VOMITING THAT IS NOT CONTROLLED WITH YOUR NAUSEA MEDICATION  *UNUSUAL SHORTNESS OF BREATH  *UNUSUAL BRUISING OR BLEEDING  TENDERNESS IN MOUTH AND THROAT WITH OR WITHOUT PRESENCE OF ULCERS  *URINARY PROBLEMS  *BOWEL PROBLEMS  UNUSUAL RASH Items with * indicate a potential emergency and should be followed up as soon as possible.  Feel free to call the clinic you have any questions or concerns. The clinic phone number is [phone].  Please show the [place] at check-in to the Emergency Department and triage nurse.  NEW PRESCRIPTIONS:  #1 ATIVAN (LORAZEPAM) 0.5 MG 1 TABLET EVERY 6 HOURS AS NEEDED FOR NAUSEA.  TAKE THIS ONE FIRST IF EXPERIENCING NAUSEA. WILL MAKE YOU A BIT SLEEPY.  #2.. ZOFRAN (ONDANSETRON)  4 MG 1 TABLET EVERY 8 HOURS AS NEEDED FOR NAUSEA. TAKE THIS IF LORAZEPAM NOT EFFECTIVE.  #3[employer] COUGH MEDICINE. YOU CAN GET THIS OVER THE COUNTER. TAKE AS DIRECTED ON BOTTLE.

## 2014-10-01 NOTE — Progress Notes (Signed)
Pt with significant heart disease history. Pharmacy has reviewed current medications and scheduled pre-meds. Decision made with Karl Metro, Pa to do EKG prior to any medications for baseline.   His pre-meds can affect his QT intervals. And he is on Tikosyn for arrythmias.  Decision made to change pre-meds to Emend and decadron 8 mg. Home anti-nausea meds to be low dose Zofran ('4mg'$  every 6 hours as needed and Ativan 0.5 mg every 6 hours as needed). He is to start with the ativan. Education provided to pt and his wife per Evelena Peat, Software engineer and this Therapist, sports. Zofran and Ativan called in per Shauna Hugh, RN for Dr. Julien Nordmann Also to get OTC Delsym for cough. Instructions provided.

## 2014-10-02 ENCOUNTER — Other Ambulatory Visit: Payer: Self-pay | Admitting: Internal Medicine

## 2014-10-02 ENCOUNTER — Telehealth: Payer: Self-pay | Admitting: Medical Oncology

## 2014-10-02 ENCOUNTER — Encounter: Payer: Self-pay | Admitting: Pulmonary Disease

## 2014-10-02 ENCOUNTER — Ambulatory Visit (HOSPITAL_BASED_OUTPATIENT_CLINIC_OR_DEPARTMENT_OTHER): Payer: Medicare Other

## 2014-10-02 ENCOUNTER — Ambulatory Visit (INDEPENDENT_AMBULATORY_CARE_PROVIDER_SITE_OTHER): Payer: Medicare Other | Admitting: Pulmonary Disease

## 2014-10-02 ENCOUNTER — Ambulatory Visit: Payer: Medicare Other | Admitting: Pulmonary Disease

## 2014-10-02 VITALS — BP 137/49 | HR 68 | Temp 97.7°F | Resp 20

## 2014-10-02 VITALS — BP 118/72 | HR 77 | Ht 69.0 in | Wt 206.0 lb

## 2014-10-02 DIAGNOSIS — C3412 Malignant neoplasm of upper lobe, left bronchus or lung: Secondary | ICD-10-CM | POA: Diagnosis not present

## 2014-10-02 DIAGNOSIS — C3492 Malignant neoplasm of unspecified part of left bronchus or lung: Secondary | ICD-10-CM

## 2014-10-02 DIAGNOSIS — J849 Interstitial pulmonary disease, unspecified: Secondary | ICD-10-CM

## 2014-10-02 DIAGNOSIS — Z5111 Encounter for antineoplastic chemotherapy: Secondary | ICD-10-CM

## 2014-10-02 MED ORDER — SODIUM CHLORIDE 0.9 % IV SOLN
Freq: Once | INTRAVENOUS | Status: AC
  Administered 2014-10-02: 13:00:00 via INTRAVENOUS

## 2014-10-02 MED ORDER — SODIUM CHLORIDE 0.9 % IV SOLN
120.0000 mg/m2 | Freq: Once | INTRAVENOUS | Status: AC
  Administered 2014-10-02: 260 mg via INTRAVENOUS
  Filled 2014-10-02: qty 13

## 2014-10-02 MED ORDER — LORAZEPAM 2 MG/ML IJ SOLN
INTRAMUSCULAR | Status: AC
  Filled 2014-10-02: qty 1

## 2014-10-02 MED ORDER — LORAZEPAM 2 MG/ML IJ SOLN
0.5000 mg | INTRAMUSCULAR | Status: AC | PRN
  Administered 2014-10-02: 0.5 mg via INTRAVENOUS

## 2014-10-02 MED ORDER — SODIUM CHLORIDE 0.9 % IV SOLN
Freq: Once | INTRAVENOUS | Status: AC
  Administered 2014-10-02: 14:00:00 via INTRAVENOUS
  Filled 2014-10-02: qty 1

## 2014-10-02 NOTE — Patient Instructions (Signed)
[facility] Discharge Instructions for Patients Receiving Chemotherapy  Today you received the following chemotherapy agents:  Etoposide  To help prevent nausea and vomiting after your treatment, we encourage you to take your nausea medication as ordered per MD.    If you develop nausea and vomiting that is not controlled by your nausea medication, call the clinic.   BELOW ARE SYMPTOMS THAT SHOULD BE REPORTED IMMEDIATELY:  *FEVER GREATER THAN 100.5 F  *CHILLS WITH OR WITHOUT FEVER  NAUSEA AND VOMITING THAT IS NOT CONTROLLED WITH YOUR NAUSEA MEDICATION  *UNUSUAL SHORTNESS OF BREATH  *UNUSUAL BRUISING OR BLEEDING  TENDERNESS IN MOUTH AND THROAT WITH OR WITHOUT PRESENCE OF ULCERS  *URINARY PROBLEMS  *BOWEL PROBLEMS  UNUSUAL RASH Items with * indicate a potential emergency and should be followed up as soon as possible.  Feel free to call the clinic you have any questions or concerns. The clinic phone number is [phone].  Please show the CHEMO ALERT CARD at check-in to the Emergency Department and triage nurse.   

## 2014-10-02 NOTE — Telephone Encounter (Signed)
Unable to reach pt by phone -he has another chemo today.

## 2014-10-02 NOTE — Patient Instructions (Signed)
We will have you come back in 6 months with a pulmonary function test Come back and see [place] sooner if needed

## 2014-10-02 NOTE — Progress Notes (Signed)
Subjective:    Patient ID: Karl [name], male    DOB: 1935-03-26, 79 y.o.   MRN: [id number]  Synopsis: Karl [name] first saw the [facility] office in the fall of 2014 for pulmonary fibrosis diagnosed in the setting of newly diagnosed rheumatoid arthritis.  CT scan findings were felt to be non-specific. He formerly smoked 1 ppd for 40 years. 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  HPI  Chief Complaint  Patient presents with  . Follow-up    pt c/o increased fatigue, sob, mostly nonprodcough with some white mucus.  Pt started chemo yesterday.     He had his first chemo yesterday, toelrated well. No more hemoptysis. He has been feeling OK otherwise. Plan for chemo multiple days this week.No plans for radiation so far. He had a PET scan last week.  Past Medical History  Diagnosis Date  . Coronary artery disease     status post CABG by Dr. Redmond Pulling in 1993 and status post percutaneous transluminal coronary angioplasty and stenting by Dr. Wynonia Lawman in 2002 and 2006  . Carotid artery occlusion     severe left internal carotid artery stenosis, asymptomatic status post carotid endarterectomy  . Hypertensive heart disease without CHF   . Carotid artery disease   . Hyperlipidemia   . ICD (implantable cardiac defibrillator) in place   . CHF (congestive heart failure)   . Myocardial infarction ~ 1992  . Anginal pain   . Pacemaker   . 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 H/O hiatal hernia     "gone after my bypass"  . Arthritis     "neck, back, knees"  . Ischemic cardiomyopathy   . Atrial fibrillation   . Rheumatoid arthritis(714.0)   . AICD (automatic cardioverter/defibrillator) present      Review of Systems  Constitutional: Positive for fatigue. Negative for fever and chills.  HENT: Negative for congestion, nosebleeds, postnasal drip and rhinorrhea.   Respiratory: Positive for cough. Negative for shortness of  breath and wheezing.   Cardiovascular: Positive for chest pain. Negative for palpitations and leg swelling.       Objective:   Physical Exam  Filed Vitals:   10/02/14 0857  BP: 118/72  Pulse: 77  Height: '5\' 9"'$  (1.753 m)  Weight: 206 lb (93.441 kg)  SpO2: 95%  RA  Gen: well appearing HEENT: NCAT, EOMi PULM: Few insp crackles throughout CV: RRR, no mgr AB: BS+, soft, nontender MSK: trace edema Neuro: Awake and alert  June 2014 full pulmonary function testing> ratio 86%, FEV1 2.91 L (96% predicted), no change with bronchodilator, total lung capacity 5.46 L (74% predicted) DLCO 15.09 (47% predicted) May 2014 CT chest>> there is no evidence of a pulmonary embolism, there is centrilobular emphysema seen throughout with significant paraseptal emphysema. There is also intralobular septal thickening and groundglass throughout the lungs and periphery in a fairly diffuse pattern. There no prior studies for further evaluation. 11/2012 CT Chest > no comment if progression from prior studies, persistent ground glass and interlobular septal thickening with emphysema; felt to be consistent with NSIP or UIP  August 2016 oncology notes reviewed where he is being planned for treatment for his small cell cancer  PET CT from August 2016 reviewed showing that the disease is limited to the left lung and likely pleural fluid     Assessment & Plan:   #1  small cell lung cancer, limited stage, left chest: He  is tolerating chemotherapy well without complication at this point. The plan will be for him to continue chemotherapy this month.  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   Plan: At this time we will just continue monitoring with a repeat pulmonary function test in 6 months     Updated Medication List Outpatient Encounter  Prescriptions as of 10/02/2014  Medication Sig  . aspirin 81 MG tablet Take 81 mg by mouth daily.    . Canagliflozin (INVOKANA) 300 MG TABS Take 1 tablet by mouth daily.  . carvedilol (COREG) 3.125 MG tablet Take 6.25 mg by mouth 2 (two) times daily with a meal.   . Cyanocobalamin (VITAMIN B-12) 2500 MCG SUBL Place 1 tablet under the tongue daily.    Marland Kitchen dofetilide (TIKOSYN) 250 MCG capsule Take 250 mcg by mouth 2 (two) times daily.  . furosemide (LASIX) 40 MG tablet Take 20 mg by mouth daily.   Marland Kitchen HUMALOG KWIKPEN 100 UNIT/ML KiwkPen Inject 4 Units into the skin daily.  . insulin detemir (LEVEMIR) 100 UNIT/ML injection Inject 26 Units into the skin at bedtime.  Marland Kitchen LORazepam (ATIVAN) 0.5 MG tablet Take 1 tablet (0.5 mg total) by mouth every 6 (six) hours as needed (for nausea).  . losartan (COZAAR) 100 MG tablet Take 100 mg by mouth daily.    . nitroGLYCERIN (NITROSTAT) 0.4 MG SL tablet Place 0.4 mg under the tongue every 5 (five) minutes as needed. For chest pain  . NOVOFINE 32G X 6 MM MISC   . Omega-3 Fatty Acids (FISH OIL) 1000 MG CAPS Take by mouth 2 (two) times daily.    . ondansetron (ZOFRAN) 4 MG tablet Take 1 tablet (4 mg total) by mouth every 8 (eight) hours as needed for nausea or vomiting.  Marland Kitchen PRADAXA 150 MG CAPS capsule TAKE 1 CAPSULE BY MOUTH TWICE DAILY  . simvastatin (ZOCOR) 40 MG tablet Take 40 mg by mouth at bedtime.     Facility-Administered Encounter Medications as of 10/02/2014  Medication  . fludeoxyglucose F - 18 (FDG) injection 9.98 milli [place]ie

## 2014-10-03 ENCOUNTER — Ambulatory Visit (HOSPITAL_BASED_OUTPATIENT_CLINIC_OR_DEPARTMENT_OTHER): Payer: Medicare Other

## 2014-10-03 VITALS — BP 143/56 | HR 59 | Temp 96.6°F | Resp 20

## 2014-10-03 DIAGNOSIS — C3412 Malignant neoplasm of upper lobe, left bronchus or lung: Secondary | ICD-10-CM | POA: Diagnosis not present

## 2014-10-03 DIAGNOSIS — C3492 Malignant neoplasm of unspecified part of left bronchus or lung: Secondary | ICD-10-CM

## 2014-10-03 DIAGNOSIS — Z5111 Encounter for antineoplastic chemotherapy: Secondary | ICD-10-CM | POA: Diagnosis not present

## 2014-10-03 MED ORDER — SODIUM CHLORIDE 0.9 % IV SOLN
120.0000 mg/m2 | Freq: Once | INTRAVENOUS | Status: AC
  Administered 2014-10-03: 260 mg via INTRAVENOUS
  Filled 2014-10-03: qty 13

## 2014-10-03 MED ORDER — DEXAMETHASONE SODIUM PHOSPHATE 100 MG/10ML IJ SOLN
Freq: Once | INTRAMUSCULAR | Status: AC
  Administered 2014-10-03: 14:00:00 via INTRAVENOUS
  Filled 2014-10-03: qty 1

## 2014-10-03 MED ORDER — SODIUM CHLORIDE 0.9 % IV SOLN
Freq: Once | INTRAVENOUS | Status: AC
  Administered 2014-10-03: 13:00:00 via INTRAVENOUS

## 2014-10-03 NOTE — Patient Instructions (Signed)
[facility] Discharge Instructions for Patients Receiving Chemotherapy  Today you received the following chemotherapy agents: Etoposide   To help prevent nausea and vomiting after your treatment, we encourage you to take your nausea medication as directed.    If you develop nausea and vomiting that is not controlled by your nausea medication, call the clinic.   BELOW ARE SYMPTOMS THAT SHOULD BE REPORTED IMMEDIATELY:  *FEVER GREATER THAN 100.5 F  *CHILLS WITH OR WITHOUT FEVER  NAUSEA AND VOMITING THAT IS NOT CONTROLLED WITH YOUR NAUSEA MEDICATION  *UNUSUAL SHORTNESS OF BREATH  *UNUSUAL BRUISING OR BLEEDING  TENDERNESS IN MOUTH AND THROAT WITH OR WITHOUT PRESENCE OF ULCERS  *URINARY PROBLEMS  *BOWEL PROBLEMS  UNUSUAL RASH Items with * indicate a potential emergency and should be followed up as soon as possible.  Feel free to call the clinic you have any questions or concerns. The clinic phone number is [phone].  Please show the CHEMO ALERT CARD at check-in to the Emergency Department and triage nurse.   

## 2014-10-05 ENCOUNTER — Ambulatory Visit (HOSPITAL_BASED_OUTPATIENT_CLINIC_OR_DEPARTMENT_OTHER): Payer: Medicare Other

## 2014-10-05 VITALS — BP 134/51 | HR 58 | Temp 98.3°F

## 2014-10-05 DIAGNOSIS — C3412 Malignant neoplasm of upper lobe, left bronchus or lung: Secondary | ICD-10-CM

## 2014-10-05 DIAGNOSIS — C3492 Malignant neoplasm of unspecified part of left bronchus or lung: Secondary | ICD-10-CM

## 2014-10-05 DIAGNOSIS — Z5189 Encounter for other specified aftercare: Secondary | ICD-10-CM | POA: Diagnosis not present

## 2014-10-05 MED ORDER — PEGFILGRASTIM INJECTION 6 MG/0.6ML ~~LOC~~
6.0000 mg | PREFILLED_SYRINGE | Freq: Once | SUBCUTANEOUS | Status: AC
  Administered 2014-10-05: 6 mg via SUBCUTANEOUS
  Filled 2014-10-05: qty 0.6

## 2014-10-05 NOTE — Patient Instructions (Signed)
Pegfilgrastim injection What is this medicine? PEGFILGRASTIM (peg fil GRA stim) is a long-acting granulocyte colony-stimulating factor that stimulates the growth of neutrophils, a type of white blood cell important in the body's fight against infection. It is used to reduce the incidence of fever and infection in patients with certain types of cancer who are receiving chemotherapy that affects the bone marrow. This medicine may be used for other purposes; ask your health care provider or pharmacist if you have questions. COMMON BRAND NAME(S): Neulasta What should I tell my health care provider before I take this medicine? They need to know if you have any of these conditions: -latex allergy -ongoing radiation therapy -sickle cell disease -skin reactions to acrylic adhesives (On-Body Injector only) -an unusual or allergic reaction to pegfilgrastim, filgrastim, other medicines, foods, dyes, or preservatives -pregnant or trying to get pregnant -breast-feeding How should I use this medicine? This medicine is for injection under the skin. If you get this medicine at home, you will be taught how to prepare and give the pre-filled syringe or how to use the On-body Injector. Refer to the patient Instructions for Use for detailed instructions. Use exactly as directed. Take your medicine at regular intervals. Do not take your medicine more often than directed. It is important that you put your used needles and syringes in a special sharps container. Do not put them in a trash can. If you do not have a sharps container, call your pharmacist or healthcare provider to get one. Talk to your pediatrician regarding the use of this medicine in children. Special care may be needed. Overdosage: If you think you have taken too much of this medicine contact a poison control center or emergency room at once. NOTE: This medicine is only for you. Do not share this medicine with others. What if I miss a dose? It is  important not to miss your dose. Call your doctor or health care professional if you miss your dose. If you miss a dose due to an On-body Injector failure or leakage, a new dose should be administered as soon as possible using a single prefilled syringe for manual use. What may interact with this medicine? Interactions have not been studied. Give your health care provider a list of all the medicines, herbs, non-prescription drugs, or dietary supplements you use. Also tell them if you smoke, drink alcohol, or use illegal drugs. Some items may interact with your medicine. This list may not describe all possible interactions. Give your health care provider a list of all the medicines, herbs, non-prescription drugs, or dietary supplements you use. Also tell them if you smoke, drink alcohol, or use illegal drugs. Some items may interact with your medicine. What should I watch for while using this medicine? You may need blood work done while you are taking this medicine. If you are going to need a MRI, CT scan, or other procedure, tell your doctor that you are using this medicine (On-Body Injector only). What side effects may I notice from receiving this medicine? Side effects that you should report to your doctor or health care professional as soon as possible: -allergic reactions like skin rash, itching or hives, swelling of the face, lips, or tongue -dizziness -fever -pain, redness, or irritation at site where injected -pinpoint red spots on the skin -shortness of breath or breathing problems -stomach or side pain, or pain at the shoulder -swelling -tiredness -trouble passing urine Side effects that usually do not require medical attention (report to your doctor   or health care professional if they continue or are bothersome): -bone pain -muscle pain This list may not describe all possible side effects. Call your doctor for medical advice about side effects. You may report side effects to FDA at  1-800-FDA-1088. Where should I keep my medicine? Keep out of the reach of children. Store pre-filled syringes in a refrigerator between 2 and 8 degrees C (36 and 46 degrees F). Do not freeze. Keep in carton to protect from light. Throw away this medicine if it is left out of the refrigerator for more than 48 hours. Throw away any unused medicine after the expiration date. NOTE: This sheet is a summary. It may not cover all possible information. If you have questions about this medicine, talk to your doctor, pharmacist, or health care provider.  2015, [employer]. (2013-04-20 16:14:05)  

## 2014-10-10 ENCOUNTER — Other Ambulatory Visit (HOSPITAL_BASED_OUTPATIENT_CLINIC_OR_DEPARTMENT_OTHER): Payer: Medicare Other

## 2014-10-10 ENCOUNTER — Ambulatory Visit (HOSPITAL_BASED_OUTPATIENT_CLINIC_OR_DEPARTMENT_OTHER): Payer: Medicare Other | Admitting: Physician Assistant

## 2014-10-10 ENCOUNTER — Encounter: Payer: Self-pay | Admitting: Physician Assistant

## 2014-10-10 ENCOUNTER — Telehealth: Payer: Self-pay | Admitting: Physician Assistant

## 2014-10-10 VITALS — BP 141/53 | HR 78 | Temp 98.6°F | Resp 18 | Ht 69.0 in | Wt 200.3 lb

## 2014-10-10 DIAGNOSIS — C3412 Malignant neoplasm of upper lobe, left bronchus or lung: Secondary | ICD-10-CM

## 2014-10-10 DIAGNOSIS — R05 Cough: Secondary | ICD-10-CM | POA: Diagnosis not present

## 2014-10-10 DIAGNOSIS — C3490 Malignant neoplasm of unspecified part of unspecified bronchus or lung: Secondary | ICD-10-CM

## 2014-10-10 DIAGNOSIS — C3492 Malignant neoplasm of unspecified part of left bronchus or lung: Secondary | ICD-10-CM

## 2014-10-10 LAB — CBC WITH DIFFERENTIAL/PLATELET
BASO%: 3.1 % — ABNORMAL HIGH (ref 0.0–2.0)
Basophils Absolute: 0 10*3/uL (ref 0.0–0.1)
EOS ABS: 0.1 10*3/uL (ref 0.0–0.5)
EOS%: 8.4 % — ABNORMAL HIGH (ref 0.0–7.0)
HCT: 36.1 % — ABNORMAL LOW (ref 38.4–49.9)
HGB: 12.3 g/dL — ABNORMAL LOW (ref 13.0–17.1)
LYMPH%: 82.4 % — AB (ref 14.0–49.0)
MCH: 30.9 pg (ref 27.2–33.4)
MCHC: 34.1 g/dL (ref 32.0–36.0)
MCV: 90.7 fL (ref 79.3–98.0)
MONO#: 0.1 10*3/uL (ref 0.1–0.9)
MONO%: 3.8 % (ref 0.0–14.0)
NEUT%: 2.3 % — ABNORMAL LOW (ref 39.0–75.0)
NEUTROS ABS: 0 10*3/uL — AB (ref 1.5–6.5)
PLATELETS: 83 10*3/uL — AB (ref 140–400)
RBC: 3.98 10*6/uL — AB (ref 4.20–5.82)
RDW: 12.7 % (ref 11.0–14.6)
WBC: 1.3 10*3/uL — AB (ref 4.0–10.3)
lymph#: 1.1 10*3/uL (ref 0.9–3.3)
nRBC: 0 % (ref 0–0)

## 2014-10-10 LAB — COMPREHENSIVE METABOLIC PANEL (CC13)
ALT: 43 U/L (ref 0–55)
ANION GAP: 6 meq/L (ref 3–11)
AST: 26 U/L (ref 5–34)
Albumin: 3.1 g/dL — ABNORMAL LOW (ref 3.5–5.0)
Alkaline Phosphatase: 100 U/L (ref 40–150)
BUN: 18.5 mg/dL (ref 7.0–26.0)
CALCIUM: 9.4 mg/dL (ref 8.4–10.4)
CHLORIDE: 107 meq/L (ref 98–109)
CO2: 26 mEq/L (ref 22–29)
Creatinine: 1 mg/dL (ref 0.7–1.3)
EGFR: 75 mL/min/{1.73_m2} — ABNORMAL LOW (ref >90)
Glucose: 209 mg/dl — ABNORMAL HIGH (ref 70–140)
POTASSIUM: 4.2 meq/L (ref 3.5–5.1)
Sodium: 140 mEq/L (ref 136–145)
Total Bilirubin: 0.72 mg/dL (ref 0.20–1.20)
Total Protein: 7 g/dL (ref 6.4–8.3)

## 2014-10-10 LAB — MAGNESIUM (CC13): Magnesium: 2 mg/dl (ref 1.5–2.5)

## 2014-10-10 MED ORDER — CEPHALEXIN 500 MG PO CAPS: 500.0000 mg | ORAL_CAPSULE | Freq: Three times a day (TID) | ORAL | Status: DC

## 2014-10-10 MED ORDER — BENZONATATE 200 MG PO CAPS: 200.0000 mg | ORAL_CAPSULE | Freq: Three times a day (TID) | ORAL | Status: DC | PRN

## 2014-10-10 NOTE — Telephone Encounter (Signed)
Appointments made and avs printed for patient,email to dr Julien Nordmann to give appointment

## 2014-10-10 NOTE — Progress Notes (Signed)
No images are attached to the encounter. No scans are attached to the encounter. No scans are attached to the encounter. Karl [name] SHARED VISIT PROGRESS NOTE  Karl Reel, MD [place] [address]  DIAGNOSIS: Small cell lung carcinoma   Staging form: Lung, AJCC 7th Edition     Clinical stage from 10/01/2014: Stage IIIB (T3, N3, M0) - Unsigned       Staging comments: Small cell  PRIOR THERAPY: none  CURRENT THERAPY: Systemic chemotherapy with Carboplatin AUC 5 given on day 1 and etoposide 120 mg/m2 given on days 1, 2, and 3 every 3 weeks with Neulasta support. Status post 1 cycle  INTERVAL HISTORY: Karl [name] 79 y.o. male returns for a scheduled regular symptom management visit for followup of his recently diagnosed limited stage small cell lung cancer. He is status post 1 cycle of systemic chemotherapy with carboplatin and etoposide with Neulasta support. Overall he tolerated his first cycle of chemotherapy relatively well. His main complaint is a dry cough primarily at night. Delsym cough syrup did not help much. He slept better last nigh by sleeping in his recliner. Propping up on pillows in the bed did not prove helpful. He voiced no other specific complaints.  MEDICAL HISTORY: Past Medical History  Diagnosis Date  . Coronary artery disease     status post CABG by Dr. Redmond Pulling in 1993 and status post percutaneous transluminal coronary angioplasty and stenting by Dr. Wynonia Lawman in 2002 and 2006  . Carotid artery occlusion     severe left internal carotid artery stenosis, asymptomatic status post carotid endarterectomy  . Hypertensive heart disease without CHF   . Carotid artery disease   . Hyperlipidemia   . ICD (implantable cardiac defibrillator) in place   . CHF (congestive heart failure)   . Myocardial infarction ~ 1992  . Anginal pain   . Pacemaker   . 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 H/O hiatal hernia     "gone after  my bypass"  . Arthritis     "neck, back, knees"  . Ischemic cardiomyopathy   . Atrial fibrillation   . Rheumatoid arthritis(714.0)   . AICD (automatic cardioverter/defibrillator) present     ALLERGIES:  is allergic to amiodarone and rosiglitazone-metformin.  MEDICATIONS:  Current Outpatient Prescriptions  Medication Sig Dispense Refill  . aspirin 81 MG tablet Take 81 mg by mouth daily.      . benzonatate (TESSALON) 200 MG capsule Take 1 capsule (200 mg total) by mouth 3 (three) times daily as needed for cough. 20 capsule 0  . Canagliflozin (INVOKANA) 300 MG TABS Take 1 tablet by mouth daily.    . carvedilol (COREG) 3.125 MG tablet Take 6.25 mg by mouth 2 (two) times daily with a meal.     . cephALEXin (KEFLEX) 500 MG capsule Take 1 capsule (500 mg total) by mouth 3 (three) times daily. 15 capsule 0  . Cyanocobalamin (VITAMIN B-12) 2500 MCG SUBL Place 1 tablet under the tongue daily.      Marland Kitchen dofetilide (TIKOSYN) 250 MCG capsule Take 250 mcg by mouth 2 (two) times daily.    . furosemide (LASIX) 40 MG tablet Take 20 mg by mouth daily.     Marland Kitchen HUMALOG KWIKPEN 100 UNIT/ML KiwkPen Inject 4 Units into the skin daily.  6  . insulin detemir (LEVEMIR) 100 UNIT/ML injection Inject 26 Units into the skin at bedtime.    Marland Kitchen LORazepam (ATIVAN) 0.5 MG tablet Take  1 tablet (0.5 mg total) by mouth every 6 (six) hours as needed (for nausea). 30 tablet 0  . losartan (COZAAR) 100 MG tablet Take 100 mg by mouth daily.      . nitroGLYCERIN (NITROSTAT) 0.4 MG SL tablet Place 0.4 mg under the tongue every 5 (five) minutes as needed. For chest pain    . NOVOFINE 32G X 6 MM MISC   12  . Omega-3 Fatty Acids (FISH OIL) 1000 MG CAPS Take by mouth 2 (two) times daily.      . ondansetron (ZOFRAN) 4 MG tablet Take 1 tablet (4 mg total) by mouth every 8 (eight) hours as needed for nausea or vomiting. 30 tablet 0  . PRADAXA 150 MG CAPS capsule TAKE 1 CAPSULE BY MOUTH TWICE DAILY 60 capsule 5  . simvastatin (ZOCOR) 40 MG  tablet Take 40 mg by mouth at bedtime.       No current facility-administered medications for this visit.    SURGICAL HISTORY:  Past Surgical History  Procedure Laterality Date  . Carotid endarterectomy  2009    left  . Cardiac defibrillator placement  05/2006    [employer] Jude   . Eye surgery  March 2013    Cataract Left eye  . Eye surgery  06/08/2011    Cataract Right eye  . Pr vein bypass graft,aorto-fem-pop    . Inguinal hernia repair  1970's    bilaterally  . Hemorrhoid surgery  1970's  . Coronary angioplasty with stent placement      "I've got 2" (08/10/11)  . Cardioversion  08/2010  . Insert / replace / remove pacemaker  05/2006    "got a defibrillator/pacemaker" (08/10/11)  . Coronary artery bypass graft  08/1991    CABG X5  . Cardioversion  08/13/2011    Procedure: CARDIOVERSION;  Surgeon: Jacolyn Reedy, MD;  Location: [facility];  Service: Cardiovascular;  Laterality: N/A;  . Left heart catheterization with coronary/graft angiogram N/A 06/16/2012    Procedure: LEFT HEART CATHETERIZATION WITH Beatrix Fetters;  Surgeon: Jacolyn Reedy, MD;  Location: [facility] CATH LAB;  Service: Cardiovascular;  Laterality: N/A;  . Video bronchoscopy Bilateral 09/11/2014    Procedure: VIDEO BRONCHOSCOPY WITH FLUORO;  Surgeon: Juanito Doom, MD;  Location: [facility];  Service: Cardiopulmonary;  Laterality: Bilateral;    REVIEW OF SYSTEMS:  Review of Systems  Constitutional: Negative for fever, chills, weight loss, malaise/fatigue and diaphoresis.  HENT: Negative for congestion, ear discharge, ear pain, hearing loss, nosebleeds, sore throat and tinnitus.   Eyes: Negative for blurred vision, double vision, photophobia, pain, discharge and redness.  Respiratory: Positive for cough. Negative for hemoptysis, sputum production, shortness of breath, wheezing and stridor.   Cardiovascular: Negative for chest pain, palpitations, orthopnea, claudication, leg swelling and PND.  Gastrointestinal: Negative  for heartburn, nausea, vomiting, abdominal pain, diarrhea, constipation, blood in stool and melena.  Genitourinary: Negative.   Musculoskeletal: Negative.   Skin: Negative.   Neurological: Negative for dizziness, tingling, focal weakness, seizures, weakness and headaches.  Endo/Heme/Allergies: Does not bruise/bleed easily.  Psychiatric/Behavioral: Negative for depression. The patient is not nervous/anxious and does not have insomnia.      PHYSICAL EXAMINATION: Physical Exam  Constitutional: He is oriented to person, place, and time and well-developed, well-nourished, and in no distress.  HENT:  Head: Normocephalic and atraumatic.  Mouth/Throat: Oropharynx is clear and moist.  Eyes: Pupils are equal, round, and reactive to light.  Neck: Normal range of motion. Neck supple. No JVD present. No  tracheal deviation present. No thyromegaly present.  Cardiovascular: Normal rate, regular rhythm, normal heart sounds and intact distal pulses.  Exam reveals no gallop and no friction rub.   No murmur heard. Pulmonary/Chest: Effort normal and breath sounds normal. No respiratory distress. He has no wheezes. He has no rales.  Abdominal: Soft. Bowel sounds are normal. He exhibits no distension and no mass. There is no tenderness.  Musculoskeletal: Normal range of motion. He exhibits no edema or tenderness.  Lymphadenopathy:    He has no cervical adenopathy.  Neurological: He is alert and oriented to person, place, and time. He has normal reflexes. Gait normal.  Skin: Skin is warm and dry. No rash noted.    ECOG PERFORMANCE STATUS: 1 - Symptomatic but completely ambulatory  Blood pressure 141/53, pulse 78, temperature 98.6 F (37 C), temperature source Oral, resp. rate 18, height '5\' 9"'$  (1.753 m), weight 200 lb 4.8 oz (90.855 kg), SpO2 96 %.  LABORATORY DATA: Lab Results  Component Value Date   WBC 1.3* 10/10/2014   HGB 12.3* 10/10/2014   HCT 36.1* 10/10/2014   MCV 90.7 10/10/2014   PLT 83*  10/10/2014      Chemistry      Component Value Date/Time   NA 140 10/10/2014 1058   NA 138 08/13/2011 0535   K 4.2 10/10/2014 1058   K 3.8 08/13/2011 0535   CL 100 08/13/2011 0535   CO2 26 10/10/2014 1058   CO2 27 08/13/2011 0535   BUN 18.5 10/10/2014 1058   BUN 19 08/13/2011 0535   CREATININE 1.0 10/10/2014 1058   CREATININE 1.15 08/13/2011 0535      Component Value Date/Time   CALCIUM 9.4 10/10/2014 1058   CALCIUM 9.6 08/13/2011 0535   ALKPHOS 100 10/10/2014 1058   ALKPHOS 93 08/10/2011 1211   AST 26 10/10/2014 1058   AST 18 08/10/2011 1211   ALT 43 10/10/2014 1058   ALT 24 08/10/2011 1211   BILITOT 0.72 10/10/2014 1058   BILITOT 0.4 08/10/2011 1211       RADIOGRAPHIC STUDIES:  Ct Head W Wo Contrast  09/29/2014   CLINICAL DATA:  Staging small cell lung cancer. History of CVA. ICD/pacer.  EXAM: CT HEAD WITHOUT AND WITH CONTRAST  TECHNIQUE: Contiguous axial images were obtained from the base of the skull through the vertex without and with intravenous contrast  CONTRAST:  151m OMNIPAQUE IOHEXOL 300 MG/ML  SOLN  COMPARISON:  06/21/2013  FINDINGS: Skull and Sinuses:No evidence of bony metastasis.  Orbits: Bilateral cataract resection. No evidence of orbital metastasis.  Brain: There are 2 punctate calcifications in the left cerebral hemisphere seen on image 23 and image 20, stable. No evidence of brain metastasis.  Remote right PCA branch infarct affecting the right occipital lobe.  No evidence of acute infarction, hemorrhage, hydrocephalus, or mass lesion/mass effect.  IMPRESSION: 1. No evidence of intracranial metastasis. 2. Remote right occipital infarct.   Electronically Signed   By: JMonte FantasiaM.D.   On: [id number]   Nm Pet Image Initial (pi) Skull Base To Thigh  09/28/2014   CLINICAL DATA:  Initial treatment strategy for left lung cancer.  EXAM: NUCLEAR MEDICINE PET SKULL BASE TO THIGH  TECHNIQUE: 10.0 mCi F-18 FDG was injected intravenously. Full-ring PET  imaging was performed from the skull base to thigh after the radiotracer. CT data was obtained and used for attenuation correction and anatomic localization.  FASTING BLOOD GLUCOSE:  Value: 127 mg/dl  COMPARISON:  Multiple exams, including 09/07/2014  FINDINGS: NECK  Left supraclavicular node measures 1.4 cm in short axis on image 47 series 4 and has maximum standard uptake value 25.2.  CHEST  Pleural-based left upper lobe mass with suspected chest wall invasion anteriorly between the first and second ribs but currently no overt rib destruction, extending down along the right mediastinal margin, maximum standard uptake value 27.9. This mass measures 10.3 by 11.1 cm on image 75 series 4. Pathologic prevascular, AP window, bilateral hilar, and subcarinal adenopathy, hypermetabolic. An index upper prevascular lymph node measures 2.8 cm in short axis on image 61 series 4 and has maximum standard uptake value of 16.7. Subcarinal lymph node measures 2.0 cm in short axis on image 81 series 4 and has maximum standard uptake value 9.6.  Adjacent to the invasive portion of the mass along the chest wall, a subpectoral lymph node measuring 1.1 cm in short axis has maximum standard uptake value 10.7.  There is low-level activity in the trace left pleural effusion, and malignant effusion is a concern.  Mild cardiomegaly. Pacer noted. Coronary, aortic arch, and branch vessel atherosclerotic vascular disease.  ABDOMEN/PELVIS  Faintly increased left adrenal activity noted, maximum standard uptake value 4.0, but without a well-defined mass observed.  Physiologic activity in bowel. Prominent prostate gland but without hypermetabolic activity in the prostate.  SKELETON  Thoracolumbar spondylosis. No bony metastatic disease is identified.  IMPRESSION: 1. Very large pleural based left upper lobe mass with invasion of the chest wall, extensive mediastinal adenopathy, bilateral hilar adenopathy, and supraclavicular and subpectoral lymph  nodes on the left. Pleural fluid with faintly increased activity concerning for pleural metastatic disease. Lobulation along the right mediastinal border questionable for invasion. Assuming non-small cell lung carcinoma, appearance compatible with at least T3 N3 M0 disease (stage IIIB). 2. Mild cardiomegaly.  Atherosclerosis.   Electronically Signed   By: Van Clines M.D.   On: 09/28/2014 09:53     ASSESSMENT/PLAN:  No problem-specific assessment & plan notes found for this encounter.  the patient is a pleasant 79 year old Caucasian male recently diagnosed with small cell lung cancer. He is status post one cycle of systemic chemotherapy with carboplatin and etoposide with Neulasta support. He is significantly neutropenic with an ANC of 0.0 and a total white count of 1.3. He is afebrile. Due to his level of neutropenia placement of prophylactic 5 day course of Keflex at 500 mg by mouth 3 times daily. For his cough I will place him on Tessalon perles 200 mg by mouth every 8 hours as needed for cough. He will continue with weekly labs and follow-up in 2 weeks prior to the start of cycle #2  Carlton Adam, PA-C 10/10/2014     All questions were answered. The patient knows to call the clinic with any problems, questions or concerns. We can certainly see the patient much sooner if necessary.

## 2014-10-11 ENCOUNTER — Inpatient Hospital Stay (HOSPITAL_COMMUNITY)
Admission: EM | Admit: 2014-10-11 | Discharge: 2014-10-14 | DRG: 871 | Disposition: A | Discharge status: Home Health | Payer: Medicare Other | Attending: Internal Medicine | Admitting: Internal Medicine

## 2014-10-11 ENCOUNTER — Other Ambulatory Visit: Payer: Self-pay

## 2014-10-11 ENCOUNTER — Telehealth: Payer: Self-pay | Admitting: Medical Oncology

## 2014-10-11 ENCOUNTER — Encounter (HOSPITAL_COMMUNITY): Payer: Self-pay

## 2014-10-11 ENCOUNTER — Emergency Department (HOSPITAL_COMMUNITY): Payer: Medicare Other

## 2014-10-11 DIAGNOSIS — D61818 Other pancytopenia: Secondary | ICD-10-CM | POA: Diagnosis not present

## 2014-10-11 DIAGNOSIS — T451X5A Adverse effect of antineoplastic and immunosuppressive drugs, initial encounter: Secondary | ICD-10-CM | POA: Diagnosis present

## 2014-10-11 DIAGNOSIS — D703 Neutropenia due to infection: Secondary | ICD-10-CM | POA: Diagnosis present

## 2014-10-11 DIAGNOSIS — I255 Ischemic cardiomyopathy: Secondary | ICD-10-CM | POA: Diagnosis present

## 2014-10-11 DIAGNOSIS — E860 Dehydration: Secondary | ICD-10-CM | POA: Diagnosis present

## 2014-10-11 DIAGNOSIS — I2510 Atherosclerotic heart disease of native coronary artery without angina pectoris: Secondary | ICD-10-CM | POA: Diagnosis present

## 2014-10-11 DIAGNOSIS — C3490 Malignant neoplasm of unspecified part of unspecified bronchus or lung: Secondary | ICD-10-CM | POA: Diagnosis not present

## 2014-10-11 DIAGNOSIS — D709 Neutropenia, unspecified: Secondary | ICD-10-CM

## 2014-10-11 DIAGNOSIS — A419 Sepsis, unspecified organism: Secondary | ICD-10-CM | POA: Diagnosis not present

## 2014-10-11 DIAGNOSIS — E1159 Type 2 diabetes mellitus with other circulatory complications: Secondary | ICD-10-CM | POA: Diagnosis present

## 2014-10-11 DIAGNOSIS — Z833 Family history of diabetes mellitus: Secondary | ICD-10-CM

## 2014-10-11 DIAGNOSIS — M0510 Rheumatoid lung disease with rheumatoid arthritis of unspecified site: Secondary | ICD-10-CM | POA: Diagnosis present

## 2014-10-11 DIAGNOSIS — Z95810 Presence of automatic (implantable) cardiac defibrillator: Secondary | ICD-10-CM | POA: Diagnosis present

## 2014-10-11 DIAGNOSIS — Z8249 Family history of ischemic heart disease and other diseases of the circulatory system: Secondary | ICD-10-CM

## 2014-10-11 DIAGNOSIS — J84112 Idiopathic pulmonary fibrosis: Secondary | ICD-10-CM | POA: Diagnosis present

## 2014-10-11 DIAGNOSIS — Z951 Presence of aortocoronary bypass graft: Secondary | ICD-10-CM

## 2014-10-11 DIAGNOSIS — M1990 Unspecified osteoarthritis, unspecified site: Secondary | ICD-10-CM | POA: Diagnosis present

## 2014-10-11 DIAGNOSIS — R5081 Fever presenting with conditions classified elsewhere: Secondary | ICD-10-CM | POA: Diagnosis present

## 2014-10-11 DIAGNOSIS — Z79899 Other long term (current) drug therapy: Secondary | ICD-10-CM | POA: Diagnosis not present

## 2014-10-11 DIAGNOSIS — C3492 Malignant neoplasm of unspecified part of left bronchus or lung: Secondary | ICD-10-CM | POA: Diagnosis present

## 2014-10-11 DIAGNOSIS — Z955 Presence of coronary angioplasty implant and graft: Secondary | ICD-10-CM | POA: Diagnosis not present

## 2014-10-11 DIAGNOSIS — I252 Old myocardial infarction: Secondary | ICD-10-CM | POA: Diagnosis not present

## 2014-10-11 DIAGNOSIS — E785 Hyperlipidemia, unspecified: Secondary | ICD-10-CM | POA: Diagnosis present

## 2014-10-11 DIAGNOSIS — I480 Paroxysmal atrial fibrillation: Secondary | ICD-10-CM | POA: Diagnosis not present

## 2014-10-11 DIAGNOSIS — I4891 Unspecified atrial fibrillation: Secondary | ICD-10-CM | POA: Diagnosis present

## 2014-10-11 DIAGNOSIS — R509 Fever, unspecified: Secondary | ICD-10-CM | POA: Diagnosis not present

## 2014-10-11 DIAGNOSIS — E440 Moderate protein-calorie malnutrition: Secondary | ICD-10-CM | POA: Insufficient documentation

## 2014-10-11 DIAGNOSIS — Z888 Allergy status to other drugs, medicaments and biological substances status: Secondary | ICD-10-CM | POA: Diagnosis not present

## 2014-10-11 DIAGNOSIS — Z6828 Body mass index (BMI) 28.0-28.9, adult: Secondary | ICD-10-CM

## 2014-10-11 DIAGNOSIS — J8410 Pulmonary fibrosis, unspecified: Secondary | ICD-10-CM | POA: Diagnosis present

## 2014-10-11 DIAGNOSIS — E876 Hypokalemia: Secondary | ICD-10-CM | POA: Diagnosis present

## 2014-10-11 DIAGNOSIS — Z7901 Long term (current) use of anticoagulants: Secondary | ICD-10-CM | POA: Diagnosis not present

## 2014-10-11 DIAGNOSIS — J209 Acute bronchitis, unspecified: Secondary | ICD-10-CM | POA: Diagnosis not present

## 2014-10-11 DIAGNOSIS — I5022 Chronic systolic (congestive) heart failure: Secondary | ICD-10-CM | POA: Diagnosis not present

## 2014-10-11 DIAGNOSIS — I110 Hypertensive heart disease with heart failure: Secondary | ICD-10-CM | POA: Diagnosis present

## 2014-10-11 DIAGNOSIS — Z794 Long term (current) use of insulin: Secondary | ICD-10-CM

## 2014-10-11 DIAGNOSIS — D61810 Antineoplastic chemotherapy induced pancytopenia: Secondary | ICD-10-CM | POA: Diagnosis present

## 2014-10-11 HISTORY — DX: Neutropenia, unspecified: D70.9

## 2014-10-11 LAB — APTT: APTT: 37 s (ref 24–37)

## 2014-10-11 LAB — URINALYSIS, ROUTINE W REFLEX MICROSCOPIC
GLUCOSE, UA: 100 mg/dL — AB
HGB URINE DIPSTICK: NEGATIVE
Ketones, ur: NEGATIVE mg/dL
Nitrite: NEGATIVE
PH: 5 (ref 5.0–8.0)
Protein, ur: 30 mg/dL — AB
SPECIFIC GRAVITY, URINE: 1.043 — AB (ref 1.005–1.030)
Urobilinogen, UA: 1 mg/dL (ref 0.0–1.0)

## 2014-10-11 LAB — COMPREHENSIVE METABOLIC PANEL
ALK PHOS: 109 U/L (ref 38–126)
ALT: 70 U/L — AB (ref 17–63)
ANION GAP: 11 (ref 5–15)
AST: 57 U/L — ABNORMAL HIGH (ref 15–41)
Albumin: 3.3 g/dL — ABNORMAL LOW (ref 3.5–5.0)
BUN: 17 mg/dL (ref 6–20)
CALCIUM: 8.6 mg/dL — AB (ref 8.9–10.3)
CO2: 19 mmol/L — ABNORMAL LOW (ref 22–32)
CREATININE: 1.01 mg/dL (ref 0.61–1.24)
Chloride: 106 mmol/L (ref 101–111)
Glucose, Bld: 183 mg/dL — ABNORMAL HIGH (ref 65–99)
Potassium: 3.6 mmol/L (ref 3.5–5.1)
SODIUM: 136 mmol/L (ref 135–145)
Total Bilirubin: 0.7 mg/dL (ref 0.3–1.2)
Total Protein: 7 g/dL (ref 6.5–8.1)

## 2014-10-11 LAB — I-STAT TROPONIN, ED: TROPONIN I, POC: 0 ng/mL (ref 0.00–0.08)

## 2014-10-11 LAB — CBC WITH DIFFERENTIAL/PLATELET
BASOS ABS: 0 10*3/uL (ref 0.0–0.1)
BASOS PCT: 2 % — AB (ref 0–1)
EOS PCT: 4 % (ref 0–5)
Eosinophils Absolute: 0 10*3/uL (ref 0.0–0.7)
HEMATOCRIT: 32.2 % — AB (ref 39.0–52.0)
HEMOGLOBIN: 11.1 g/dL — AB (ref 13.0–17.0)
LYMPHS PCT: 72 % — AB (ref 12–46)
Lymphs Abs: 0.9 10*3/uL (ref 0.7–4.0)
MCH: 31.3 pg (ref 26.0–34.0)
MCHC: 34.5 g/dL (ref 30.0–36.0)
MCV: 90.7 fL (ref 78.0–100.0)
MONOS PCT: 11 % (ref 3–12)
Monocytes Absolute: 0.1 10*3/uL (ref 0.1–1.0)
NEUTROS ABS: 0.1 10*3/uL — AB (ref 1.7–7.7)
Neutrophils Relative %: 11 % — ABNORMAL LOW (ref 43–77)
Platelets: 68 10*3/uL — ABNORMAL LOW (ref 150–400)
RBC: 3.55 MIL/uL — ABNORMAL LOW (ref 4.22–5.81)
RDW: 12.5 % (ref 11.5–15.5)
WBC: 1.1 10*3/uL — CL (ref 4.0–10.5)

## 2014-10-11 LAB — CBG MONITORING, ED
GLUCOSE-CAPILLARY: 127 mg/dL — AB (ref 65–99)
Glucose-Capillary: 123 mg/dL — ABNORMAL HIGH (ref 65–99)

## 2014-10-11 LAB — URINE MICROSCOPIC-ADD ON

## 2014-10-11 LAB — LACTIC ACID, PLASMA: Lactic Acid, Venous: 2.2 mmol/L (ref 0.5–2.0)

## 2014-10-11 LAB — PROCALCITONIN: Procalcitonin: 1.11 ng/mL

## 2014-10-11 LAB — PROTIME-INR
INR: 1.49 (ref 0.00–1.49)
Prothrombin Time: 18.1 seconds — ABNORMAL HIGH (ref 11.6–15.2)

## 2014-10-11 LAB — I-STAT CG4 LACTIC ACID, ED: Lactic Acid, Venous: 1.57 mmol/L (ref 0.5–2.0)

## 2014-10-11 MED ORDER — DABIGATRAN ETEXILATE MESYLATE 150 MG PO CAPS
150.0000 mg | ORAL_CAPSULE | Freq: Two times a day (BID) | ORAL | Status: DC
  Administered 2014-10-12 – 2014-10-13 (×5): 150 mg via ORAL
  Filled 2014-10-11 (×8): qty 1

## 2014-10-11 MED ORDER — SIMVASTATIN 40 MG PO TABS
40.0000 mg | ORAL_TABLET | Freq: Every day | ORAL | Status: DC
  Administered 2014-10-12 – 2014-10-13 (×3): 40 mg via ORAL
  Filled 2014-10-11 (×4): qty 1

## 2014-10-11 MED ORDER — INSULIN ASPART 100 UNIT/ML ~~LOC~~ SOLN
0.0000 [IU] | Freq: Three times a day (TID) | SUBCUTANEOUS | Status: DC
  Administered 2014-10-13 (×2): 2 [IU] via SUBCUTANEOUS

## 2014-10-11 MED ORDER — ONDANSETRON HCL 4 MG/2ML IJ SOLN
4.0000 mg | Freq: Three times a day (TID) | INTRAMUSCULAR | Status: DC | PRN
  Filled 2014-10-11: qty 2

## 2014-10-11 MED ORDER — INSULIN DETEMIR 100 UNIT/ML ~~LOC~~ SOLN
20.0000 [IU] | Freq: Every day | SUBCUTANEOUS | Status: DC
  Administered 2014-10-12 – 2014-10-13 (×3): 20 [IU] via SUBCUTANEOUS
  Filled 2014-10-11 (×4): qty 0.2

## 2014-10-11 MED ORDER — SODIUM CHLORIDE 0.9 % IV SOLN
INTRAVENOUS | Status: DC
  Administered 2014-10-11 – 2014-10-12 (×2): via INTRAVENOUS

## 2014-10-11 MED ORDER — DOFETILIDE 250 MCG PO CAPS
250.0000 ug | ORAL_CAPSULE | Freq: Two times a day (BID) | ORAL | Status: DC
  Administered 2014-10-12 – 2014-10-13 (×5): 250 ug via ORAL
  Filled 2014-10-11 (×8): qty 1

## 2014-10-11 MED ORDER — VITAMIN B-12 1000 MCG PO TABS
2500.0000 ug | ORAL_TABLET | Freq: Every day | ORAL | Status: DC
Start: 2014-10-11 — End: 2014-10-14
  Administered 2014-10-12 – 2014-10-13 (×3): 2500 ug via ORAL
  Filled 2014-10-11 (×4): qty 2.5

## 2014-10-11 MED ORDER — CARVEDILOL 6.25 MG PO TABS
6.2500 mg | ORAL_TABLET | Freq: Two times a day (BID) | ORAL | Status: DC
  Administered 2014-10-12 – 2014-10-13 (×4): 6.25 mg via ORAL
  Filled 2014-10-11 (×7): qty 1

## 2014-10-11 MED ORDER — DM-GUAIFENESIN ER 30-600 MG PO TB12
1.0000 | ORAL_TABLET | Freq: Two times a day (BID) | ORAL | Status: DC
  Administered 2014-10-12 (×3): 1 via ORAL
  Filled 2014-10-11 (×4): qty 1

## 2014-10-11 MED ORDER — OMEGA-3-ACID ETHYL ESTERS 1 G PO CAPS
1.0000 g | ORAL_CAPSULE | Freq: Every day | ORAL | Status: DC
  Administered 2014-10-12 – 2014-10-13 (×3): 1 g via ORAL
  Filled 2014-10-11 (×3): qty 1

## 2014-10-11 MED ORDER — ACETAMINOPHEN 325 MG PO TABS
650.0000 mg | ORAL_TABLET | Freq: Four times a day (QID) | ORAL | Status: AC | PRN
  Administered 2014-10-12: 650 mg via ORAL
  Filled 2014-10-11: qty 2

## 2014-10-11 MED ORDER — ACETAMINOPHEN 325 MG PO TABS
650.0000 mg | ORAL_TABLET | Freq: Once | ORAL | Status: AC | PRN
  Administered 2014-10-11: 650 mg via ORAL
  Filled 2014-10-11: qty 2

## 2014-10-11 MED ORDER — SODIUM CHLORIDE 0.9 % IJ SOLN
3.0000 mL | Freq: Two times a day (BID) | INTRAMUSCULAR | Status: DC
  Administered 2014-10-12 – 2014-10-13 (×3): 3 mL via INTRAVENOUS

## 2014-10-11 MED ORDER — HYDRALAZINE HCL 20 MG/ML IJ SOLN: 5.0000 mg | INTRAMUSCULAR | Status: DC | PRN

## 2014-10-11 MED ORDER — VANCOMYCIN HCL IN DEXTROSE 1-5 GM/200ML-% IV SOLN
1000.0000 mg | Freq: Two times a day (BID) | INTRAVENOUS | Status: DC
  Administered 2014-10-11 – 2014-10-13 (×4): 1000 mg via INTRAVENOUS
  Filled 2014-10-11 (×4): qty 200

## 2014-10-11 MED ORDER — NITROGLYCERIN 0.4 MG SL SUBL: 0.4000 mg | SUBLINGUAL_TABLET | SUBLINGUAL | Status: DC | PRN

## 2014-10-11 MED ORDER — PIPERACILLIN-TAZOBACTAM 3.375 G IVPB
3.3750 g | Freq: Three times a day (TID) | INTRAVENOUS | Status: DC
  Administered 2014-10-12 – 2014-10-13 (×4): 3.375 g via INTRAVENOUS
  Filled 2014-10-11 (×5): qty 50

## 2014-10-11 MED ORDER — PIPERACILLIN-TAZOBACTAM 3.375 G IVPB 30 MIN
3.3750 g | INTRAVENOUS | Status: AC
  Administered 2014-10-11: 3.375 g via INTRAVENOUS
  Filled 2014-10-11: qty 50

## 2014-10-11 NOTE — Progress Notes (Signed)
ANTIBIOTIC CONSULT NOTE - INITIAL  Pharmacy Consult for vancomycin, Zosyn Indication: febrile neutropenia  Allergies  Allergen Reactions  . Amiodarone Other (See Comments)    Tremor   . Rosiglitazone-Metformin     Unknown    Patient Measurements: Weight: 200 lb (90.719 kg)  Vital Signs: Temp: 101.6 F (38.7 C) (09/08 1813) Temp Source: Oral (09/08 1813) BP: 121/39 mmHg (09/08 1813) Pulse Rate: 83 (09/08 1813) Intake/Output from previous day:   Intake/Output from this shift:    Labs:  Recent Labs  10/10/14 1058  WBC 1.3*  HGB 12.3*  PLT 83*  CREATININE 1.0   Estimated Creatinine Clearance: 68.9 mL/min (by C-G formula based on Cr of 1). No results for input(s): VANCOTROUGH, VANCOPEAK, VANCORANDOM, GENTTROUGH, GENTPEAK, GENTRANDOM, TOBRATROUGH, TOBRAPEAK, TOBRARND, AMIKACINPEAK, AMIKACINTROU, AMIKACIN in the last 72 hours.   Microbiology: No results found for this or any previous visit (from the past 720 hour(s)).  Medical History: Past Medical History  Diagnosis Date  . Coronary artery disease     status post CABG by Dr. Redmond Pulling in 1993 and status post percutaneous transluminal coronary angioplasty and stenting by Dr. Wynonia Lawman in 2002 and 2006  . Carotid artery occlusion     severe left internal carotid artery stenosis, asymptomatic status post carotid endarterectomy  . Hypertensive heart disease without CHF   . Carotid artery disease   . Hyperlipidemia   . ICD (implantable cardiac defibrillator) in place   . CHF (congestive heart failure)   . Myocardial infarction ~ 1992  . Anginal pain   . Pacemaker   . 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 H/O hiatal hernia     "gone after my bypass"  . Arthritis     "neck, back, knees"  . Ischemic cardiomyopathy   . Atrial fibrillation   . Rheumatoid arthritis(714.0)   . AICD (automatic cardioverter/defibrillator) present     Medications:  Scheduled:   Infusions:  . sodium chloride      Assessment: 79 yo male with limited stage SCLC s/p 1 cycle of Carbo/etoposide with Neulasta with completion on 9/2. Patient presents to ER with CC fever and chills and also with neutropenia per 9/7 labs. To start vancomycin and zosyn for febrile neutropenia. Baseline labs from 9/7 include WBC of 1.3, SCr of 1 with est CrCl of 69 ml/min and Tmax of 101.6  Goal of Therapy:  Vancomycin trough level 15-20 mcg/ml  Plan:  1) Vancomycin 1g IV q12 2) Zosyn 3.375g IV q8 (extended interval infusion)   Adrian Saran, PharmD, BCPS Pager [phone] 10/11/2014 7:12 PM

## 2014-10-11 NOTE — Telephone Encounter (Signed)
Pt saw PA yesterday and started on antibiotic for neutropenia. Pt instructed to call if temp 100.5 or higher

## 2014-10-11 NOTE — ED Notes (Signed)
Lung CA pt last chemotherapy one week ago, c/o fever and chills. Seen yesterday at cancer center yesterday informed wbc low. Started on antibiotic. Today fever 104.

## 2014-10-11 NOTE — ED Notes (Signed)
Pt wants to see if their labs can be drawn off of their IV

## 2014-10-11 NOTE — ED Notes (Signed)
Labs delayed, pt in radiology

## 2014-10-11 NOTE — ED Provider Notes (Signed)
CSN: [id number]     Arrival date & time 10/11/14  1807 History   First MD Initiated Contact with Patient 10/11/14 1834     Chief Complaint  Patient presents with  . Fever  . Oncology Pt       HPI Pt was seen at [place]. Per pt and his family, c/o gradual onset and persistence of intermittent home fevers to "102+" for the past 2 days. Has been associated with cough. LD APAP was 1500 today PTA. Pt states his LD chemo was last week. States he called his Heme/Onc MD and was told to come to the ED for further evaluation/admission. Pt denies any other symptoms. Denies CP/palpitations, no SOB, no abd pain, no N/V/D, no rash, no dysuria.    Past Medical History  Diagnosis Date  . Coronary artery disease     status post CABG by Dr. Redmond Pulling in 1993 and status post percutaneous transluminal coronary angioplasty and stenting by Dr. Wynonia Lawman in 2002 and 2006  . Carotid artery occlusion     severe left internal carotid artery stenosis, asymptomatic status post carotid endarterectomy  . Hypertensive heart disease without CHF   . Carotid artery disease   . Hyperlipidemia   . ICD (implantable cardiac defibrillator) in place   . CHF (congestive heart failure)   . Myocardial infarction ~ 1992  . Anginal pain   . Pacemaker   . 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 H/O hiatal hernia     "gone after my bypass"  . Arthritis     "neck, back, knees"  . Ischemic cardiomyopathy   . Atrial fibrillation   . Rheumatoid arthritis(714.0)   . AICD (automatic cardioverter/defibrillator) present    Past Surgical History  Procedure Laterality Date  . Carotid endarterectomy  2009    left  . Cardiac defibrillator placement  05/2006    St Jude   . Eye surgery  March 2013    Cataract Left eye  . Eye surgery  06/08/2011    Cataract Right eye  . Pr vein bypass graft,aorto-fem-pop    . Inguinal hernia repair  1970's    bilaterally  . Hemorrhoid surgery  1970's  . Coronary angioplasty with stent  placement      "I've got 2" (08/10/11)  . Cardioversion  08/2010  . Insert / replace / remove pacemaker  05/2006    "got a defibrillator/pacemaker" (08/10/11)  . Coronary artery bypass graft  08/1991    CABG X5  . Cardioversion  08/13/2011    Procedure: CARDIOVERSION;  Surgeon: Jacolyn Reedy, MD;  Location: [place];  Service: Cardiovascular;  Laterality: N/A;  . Left heart catheterization with coronary/graft angiogram N/A 06/16/2012    Procedure: LEFT HEART CATHETERIZATION WITH Beatrix Fetters;  Surgeon: Jacolyn Reedy, MD;  Location: [facility] CATH LAB;  Service: Cardiovascular;  Laterality: N/A;  . Video bronchoscopy Bilateral 09/11/2014    Procedure: VIDEO BRONCHOSCOPY WITH FLUORO;  Surgeon: Juanito Doom, MD;  Location: [place];  Service: Cardiopulmonary;  Laterality: Bilateral;   Family History  Problem Relation Age of Onset  . Diabetes Father   . Heart disease Father     Heart Disease before age 11  . Heart attack Father    Social History  Substance Use Topics  . Smoking status: Former Smoker -- 1.00 packs/day for 26 years    Types: Cigarettes    Quit date: 10/20/1980  . Smokeless tobacco: Former Systems developer    Types: Loss adjuster, chartered  Quit date: 04/02/2013  . Alcohol Use: 8.4 oz/week    14 Shots of liquor per week    Review of Systems ROS: Statement: All systems negative except as marked or noted in the HPI; Constitutional: +fever and chills. ; ; Eyes: Negative for eye pain, redness and discharge. ; ; ENMT: Negative for ear pain, hoarseness, nasal congestion, sinus pressure and sore throat. ; ; Cardiovascular: Negative for chest pain, palpitations, diaphoresis, dyspnea and peripheral edema. ; ; Respiratory: +cough. Negative for wheezing and stridor. ; ; Gastrointestinal: Negative for nausea, vomiting, diarrhea, abdominal pain, blood in stool, hematemesis, jaundice and rectal bleeding. . ; ; Genitourinary: Negative for dysuria, flank pain and hematuria. ; ; Musculoskeletal: Negative for  back pain and neck pain. Negative for swelling and trauma.; ; Skin: Negative for pruritus, rash, abrasions, blisters, bruising and skin lesion.; ; Neuro: Negative for headache, lightheadedness and neck stiffness. Negative for weakness, altered level of consciousness , altered mental status, extremity weakness, paresthesias, involuntary movement, seizure and syncope.      Allergies  Amiodarone and Rosiglitazone-metformin  Home Medications   Prior to Admission medications   Medication Sig Start Date End Date Taking? Authorizing Provider  carvedilol (COREG) 3.125 MG tablet Take 6.25 mg by mouth 2 (two) times daily with a meal.    Yes Historical Provider, MD  cephALEXin (KEFLEX) 500 MG capsule Take 1 capsule (500 mg total) by mouth 3 (three) times daily. 10/10/14  Yes Adrena E Johnson, PA-C  Cyanocobalamin (VITAMIN B-12) 2500 MCG SUBL Place 1 tablet under the tongue daily.     Yes Historical Provider, MD  dofetilide (TIKOSYN) 250 MCG capsule Take 250 mcg by mouth 2 (two) times daily.   Yes Historical Provider, MD  HUMALOG KWIKPEN 100 UNIT/ML KiwkPen Inject 4 Units into the skin 3 (three) times daily after meals.  09/04/14  Yes Historical Provider, MD  insulin detemir (LEVEMIR) 100 UNIT/ML injection Inject 28 Units into the skin daily.    Yes Historical Provider, MD  losartan (COZAAR) 100 MG tablet Take 100 mg by mouth daily.     Yes Historical Provider, MD  nitroGLYCERIN (NITROSTAT) 0.4 MG SL tablet Place 0.4 mg under the tongue every 5 (five) minutes as needed. For chest pain   Yes Historical Provider, MD  NOVOFINE 32G X 6 MM MISC Inject 1 each into the skin as directed.  05/17/14  Yes Historical Provider, MD  Omega-3 Fatty Acids (FISH OIL) 1000 MG CAPS Take 1,000 mg by mouth 2 (two) times daily.    Yes Historical Provider, MD  PRADAXA 150 MG CAPS capsule TAKE 1 CAPSULE BY MOUTH TWICE DAILY Patient taking differently: TAKE 150 MG  BY MOUTH TWICE DAILY 03/06/13  Yes Evans Lance, MD  simvastatin  (ZOCOR) 40 MG tablet Take 40 mg by mouth daily.    Yes Historical Provider, MD  benzonatate (TESSALON) 200 MG capsule Take 1 capsule (200 mg total) by mouth 3 (three) times daily as needed for cough. Patient not taking: Reported on 10/11/2014 10/10/14   Burnetta Sabin E Johnson, PA-C  LORazepam (ATIVAN) 0.5 MG tablet Take 1 tablet (0.5 mg total) by mouth every 6 (six) hours as needed (for nausea). Patient not taking: Reported on 10/11/2014 10/01/14   Carlton Adam, PA-C  ondansetron (ZOFRAN) 4 MG tablet Take 1 tablet (4 mg total) by mouth every 8 (eight) hours as needed for nausea or vomiting. Patient not taking: Reported on 10/11/2014 10/01/14   Burnetta Sabin E Johnson, PA-C   BP 121/39 mmHg  Pulse 83  Temp(Src) 101.6 F (38.7 C) (Oral)  Resp 28  Wt 200 lb (90.719 kg)  SpO2 95% Physical Exam 1900: Physical examination:  Nursing notes reviewed; Vital signs and O2 SAT reviewed; +febrile.;; Constitutional: Well developed, Well nourished, In no acute distress; Head:  Normocephalic, atraumatic; Eyes: EOMI, PERRL, No scleral icterus; ENMT: Mouth and pharynx normal, Mucous membranes dry; Neck: Supple, Full range of motion, No lymphadenopathy; Cardiovascular: Tachycardic rate and rhythm, No gallop; Respiratory: Breath sounds clear & equal bilaterally, No wheezes.  Speaking full sentences with ease, Normal respiratory effort/excursion; Chest: Nontender, Movement normal; Abdomen: Soft, Nontender, Nondistended, Normal bowel sounds; Genitourinary: No CVA tenderness; Extremities: Pulses normal, No tenderness, No edema, No calf edema or asymmetry.; Neuro: AA&Ox3, Major CN grossly intact.  Speech clear. No gross focal motor or sensory deficits in extremities.; Skin: Color normal, Warm, Dry.    ED Course  Procedures (including critical care time) Labs Review   Imaging Review  I have personally reviewed and evaluated these images and lab results as part of my medical decision-making.   EKG Interpretation   Date/Time:   Thursday October 11 2014 18:56:06 EDT Ventricular Rate:  84 PR Interval:  181 QRS Duration: 104 QT Interval:  379 QTC Calculation: 448 R Axis:   38 Text Interpretation:  Sinus rhythm Ventricular premature complex When  compared with ECG of 08/14/2011 No significant change was found Confirmed  by [facility]  MD, Nunzio Cory [id number]) on 10/11/2014 7:07:19 PM      MDM  MDM Reviewed: previous chart, nursing note and vitals Reviewed previous: labs and ECG Interpretation: labs, ECG and x-ray     Results for orders placed or performed during the hospital encounter of 10/11/14  Comprehensive metabolic panel  Result Value Ref Range   Sodium 136 135 - 145 mmol/L   Potassium 3.6 3.5 - 5.1 mmol/L   Chloride 106 101 - 111 mmol/L   CO2 19 (L) 22 - 32 mmol/L   Glucose, Bld 183 (H) 65 - 99 mg/dL   BUN 17 6 - 20 mg/dL   Creatinine, Ser 1.01 0.61 - 1.24 mg/dL   Calcium 8.6 (L) 8.9 - 10.3 mg/dL   Total Protein 7.0 6.5 - 8.1 g/dL   Albumin 3.3 (L) 3.5 - 5.0 g/dL   AST 57 (H) 15 - 41 U/L   ALT 70 (H) 17 - 63 U/L   Alkaline Phosphatase 109 38 - 126 U/L   Total Bilirubin 0.7 0.3 - 1.2 mg/dL   GFR calc non Af Amer >60 >60 mL/min   GFR calc Af Amer >60 >60 mL/min   Anion gap 11 5 - 15  Urinalysis, Routine w reflex microscopic (not at [facility])  Result Value Ref Range   Color, Urine ORANGE (A) YELLOW   APPearance TURBID (A) CLEAR   Specific Gravity, Urine 1.043 (H) 1.005 - 1.030   pH 5.0 5.0 - 8.0   Glucose, UA 100 (A) NEGATIVE mg/dL   Hgb urine dipstick NEGATIVE NEGATIVE   Bilirubin Urine SMALL (A) NEGATIVE   Ketones, ur NEGATIVE NEGATIVE mg/dL   Protein, ur 30 (A) NEGATIVE mg/dL   Urobilinogen, UA 1.0 0.0 - 1.0 mg/dL   Nitrite NEGATIVE NEGATIVE   Leukocytes, UA TRACE (A) NEGATIVE  CBC with Differential  Result Value Ref Range   WBC 1.1 (LL) 4.0 - 10.5 K/uL   RBC 3.55 (L) 4.22 - 5.81 MIL/uL   Hemoglobin 11.1 (L) 13.0 - 17.0 g/dL   HCT 32.2 (L) 39.0 - 52.0 %  MCV 90.7 78.0 - 100.0 fL   MCH  31.3 26.0 - 34.0 pg   MCHC 34.5 30.0 - 36.0 g/dL   RDW 12.5 11.5 - 15.5 %   Platelets PENDING 150 - 400 K/uL   Neutrophils Relative % PENDING 43 - 77 %   Neutro Abs PENDING 1.7 - 7.7 K/uL   Band Neutrophils PENDING 0 - 10 %   Lymphocytes Relative PENDING 12 - 46 %   Lymphs Abs PENDING 0.7 - 4.0 K/uL   Monocytes Relative PENDING 3 - 12 %   Monocytes Absolute PENDING 0.1 - 1.0 K/uL   Eosinophils Relative PENDING 0 - 5 %   Eosinophils Absolute PENDING 0.0 - 0.7 K/uL   Basophils Relative PENDING 0 - 1 %   Basophils Absolute PENDING 0.0 - 0.1 K/uL   WBC Morphology PENDING    RBC Morphology PENDING    Smear Review PENDING    nRBC PENDING 0 /100 WBC   Metamyelocytes Relative PENDING %   Myelocytes PENDING %   Promyelocytes Absolute PENDING %   Blasts PENDING %  Urine microscopic-add on  Result Value Ref Range   Squamous Epithelial / LPF RARE RARE   WBC, UA 0-2 <3 WBC/hpf   Bacteria, UA FEW (A) RARE  I-Stat CG4 Lactic Acid, ED  (not at [facility])  Result Value Ref Range   Lactic Acid, Venous 1.57 0.5 - 2.0 mmol/L  I-stat troponin, ED  Result Value Ref Range   Troponin i, poc 0.00 0.00 - 0.08 ng/mL   Comment 3           Dg Chest 2 View 10/11/2014   CLINICAL DATA:  Patient currently undergoing chemotherapy for metastatic left upper lobe lung cancer, most recent chemotherapy 1 week ago, seen at the [facility] yesterday and informed bat he has leukopenia, presenting with acute onset fever and chills 104 degrees F.  EXAM: CHEST  2 VIEW  COMPARISON:  PET-CT 09/28/2014. CT chest 09/07/2014 dating back to 06/29/2012. Two-view chest x-ray 07/31/2011.  FINDINGS: Prior sternotomy for CABG. Cardiac silhouette moderately enlarged, unchanged. Left subclavian pacing defibrillator unchanged and appears intact.  Large left upper lobe lung mass extending into the left hilum, with improvement in aeration of the left upper lobe since the PET-CT. Diffuse interstitial pulmonary fibrosis, unchanged. No new  pulmonary parenchymal abnormalities elsewhere in either lung. Degenerative changes and DISH involving the thoracic spine.  IMPRESSION: 1. Large left upper lobe lung mass with improved aeration in the left upper lobe since the PET-CT 09/28/2014, likely a combination of improved post-obstructive atelectasis and tumor shrinkage after chemotherapy. 2. Chronic Interstitial pulmonary fibrosis. 3. No new/acute abnormalities. 4. Stable cardiomegaly without evidence pulmonary edema.   Electronically Signed   By: Evangeline Dakin M.D.   On: 10/11/2014 18:53     2035:  APAP given for fever. IV abx given for neutropenic fever after BC and UC obtained. Dx and testing d/w pt and family.  Questions answered.  Verb understanding, agreeable to admit. T/C to [facility] Dr. Blaine Hamper, case discussed, including:  HPI, pertinent PM/SHx, VS/PE, dx testing, ED course and treatment:  Agreeable to admit, requests to write temporary orders, obtain stepdown bed to team [facility]Admits.   Francine Graven, DO 10/14/14 1052

## 2014-10-11 NOTE — H&P (Signed)
[facility] History and Physical  Karl [name]:[id number] DOB: [birth year] DOA: 10/11/2014  Referring physician: ED physician PCP: [name], MD  Specialists:   Chief Complaint: Fever  HPI: Karl [name] is a 79 y.o. male with PMH of recently diagnosed small cell lung cancer(currently on chemotherapy), hypertension, hyperlipidemia, diabetes mellitus, CAD (s/p of stent and CABG), carotid artery stenosis, AICD, systolic congestive heart failure (EF 30-35%), atrial fibrillation on Pradaxa, rheumatoid arthritis, who presents with fever.  Patient reports that he was recently diagnosed with small cell lung cancer. He is being treated with chemotherapy, last chemotherapy was last week. He reports that he started having gradual onset fever since last Friday. His temperature has been between 97-100 at home, but which has been worsening, and reached 102.7 today. He has chills, mild runny nose, cough with little brownish colored sputum production. No sore throat, chest pain, shortness of breath. Patient does not have abdominal pain, diarrhea, symptoms of UTI, rashes or unilateral weakness. States he called his Heme/Onc MD and was told to come to the ED for further evaluation/admission.  In ED, patient was found to have temperature 101.6, WBC 1.1, lactate is 1.57, no tachycardia, urinalysis with trace amount of leukocytes, troponin negative, electrolytes okay. X-ray showed left upper lobe mass, but no infiltration.  Where does patient live?   At home    Can patient participate in ADLs?  Some   Review of Systems:   General: has fevers, chills, no changes in body weight, has poor appetite, has fatigue HEENT: no blurry vision, hearing changes or sore throat Pulm: no dyspnea, has coughing, no wheezing CV: no chest pain, palpitations Abd: no nausea, vomiting, abdominal pain, diarrhea, constipation GU: no dysuria, burning on urination, increased urinary frequency, hematuria  Ext: no leg  edema Neuro: no unilateral weakness, numbness, or tingling, no vision change or hearing loss Skin: no rash MSK: No muscle spasm, no deformity, no limitation of range of movement in spin Heme: No easy bruising.  Travel history: No recent long distant travel.  Allergy:  Allergies  Allergen Reactions  . Amiodarone Other (See Comments)    Tremor   . Rosiglitazone-Metformin     Unknown    Past Medical History  Diagnosis Date  . Coronary artery disease     status post CABG by Dr. Redmond Pulling in 1993 and status post percutaneous transluminal coronary angioplasty and stenting by Dr. Wynonia Lawman in 2002 and 2006  . Carotid artery occlusion     severe left internal carotid artery stenosis, asymptomatic status post carotid endarterectomy  . Hypertensive heart disease without CHF   . Carotid artery disease   . Hyperlipidemia   . ICD (implantable cardiac defibrillator) in place   . CHF (congestive heart failure)   . Myocardial infarction ~ 1992  . Anginal pain   . Pacemaker   . 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 H/O hiatal hernia     "gone after my bypass"  . Arthritis     "neck, back, knees"  . Ischemic cardiomyopathy   . Atrial fibrillation   . Rheumatoid arthritis(714.0)   . AICD (automatic cardioverter/defibrillator) present     Past Surgical History  Procedure Laterality Date  . Carotid endarterectomy  2009    left  . Cardiac defibrillator placement  05/2006    St Jude   . Eye surgery  March 2013    Cataract Left eye  . Eye surgery  06/08/2011    Cataract Right eye  .  Pr vein bypass graft,aorto-fem-pop    . Inguinal hernia repair  1970's    bilaterally  . Hemorrhoid surgery  1970's  . Coronary angioplasty with stent placement      "I've got 2" (08/10/11)  . Cardioversion  08/2010  . Insert / replace / remove pacemaker  05/2006    "got a defibrillator/pacemaker" (08/10/11)  . Coronary artery bypass graft  08/1991    CABG X5  . Cardioversion  08/13/2011     Procedure: CARDIOVERSION;  Surgeon: Jacolyn Reedy, MD;  Location: [place];  Service: Cardiovascular;  Laterality: N/A;  . Left heart catheterization with coronary/graft angiogram N/A 06/16/2012    Procedure: LEFT HEART CATHETERIZATION WITH Beatrix Fetters;  Surgeon: Jacolyn Reedy, MD;  Location: [facility] CATH LAB;  Service: Cardiovascular;  Laterality: N/A;  . Video bronchoscopy Bilateral 09/11/2014    Procedure: VIDEO BRONCHOSCOPY WITH FLUORO;  Surgeon: Juanito Doom, MD;  Location: [place];  Service: Cardiopulmonary;  Laterality: Bilateral;    Social History:  reports that he quit smoking about 33 years ago. His smoking use included Cigarettes. He has a 26 pack-year smoking history. He quit smokeless tobacco use about 18 months ago. His smokeless tobacco use included Chew. He reports that he drinks about 8.4 oz of alcohol per week. He reports that he does not use illicit drugs.  Family History:  Family History  Problem Relation Age of Onset  . Diabetes Father   . Heart disease Father     Heart Disease before age 48  . Heart attack Father      Prior to Admission medications   Medication Sig Start Date End Date Taking? Authorizing Provider  carvedilol (COREG) 3.125 MG tablet Take 6.25 mg by mouth 2 (two) times daily with a meal.    Yes Historical Provider, MD  cephALEXin (KEFLEX) 500 MG capsule Take 1 capsule (500 mg total) by mouth 3 (three) times daily. 10/10/14  Yes Adrena E Johnson, PA-C  Cyanocobalamin (VITAMIN B-12) 2500 MCG SUBL Place 1 tablet under the tongue daily.     Yes Historical Provider, MD  dofetilide (TIKOSYN) 250 MCG capsule Take 250 mcg by mouth 2 (two) times daily.   Yes Historical Provider, MD  HUMALOG KWIKPEN 100 UNIT/ML KiwkPen Inject 4 Units into the skin 3 (three) times daily after meals.  09/04/14  Yes Historical Provider, MD  insulin detemir (LEVEMIR) 100 UNIT/ML injection Inject 28 Units into the skin daily.    Yes Historical Provider, MD  losartan  (COZAAR) 100 MG tablet Take 100 mg by mouth daily.     Yes Historical Provider, MD  nitroGLYCERIN (NITROSTAT) 0.4 MG SL tablet Place 0.4 mg under the tongue every 5 (five) minutes as needed. For chest pain   Yes Historical Provider, MD  NOVOFINE 32G X 6 MM MISC Inject 1 each into the skin as directed.  05/17/14  Yes Historical Provider, MD  Omega-3 Fatty Acids (FISH OIL) 1000 MG CAPS Take 1,000 mg by mouth 2 (two) times daily.    Yes Historical Provider, MD  PRADAXA 150 MG CAPS capsule TAKE 1 CAPSULE BY MOUTH TWICE DAILY Patient taking differently: TAKE 150 MG  BY MOUTH TWICE DAILY 03/06/13  Yes Evans Lance, MD  simvastatin (ZOCOR) 40 MG tablet Take 40 mg by mouth daily.    Yes Historical Provider, MD  benzonatate (TESSALON) 200 MG capsule Take 1 capsule (200 mg total) by mouth 3 (three) times daily as needed for cough. Patient not taking: Reported on 10/11/2014  10/10/14   Adrena E Johnson, PA-C  LORazepam (ATIVAN) 0.5 MG tablet Take 1 tablet (0.5 mg total) by mouth every 6 (six) hours as needed (for nausea). Patient not taking: Reported on 10/11/2014 10/01/14   Carlton Adam, PA-C  ondansetron (ZOFRAN) 4 MG tablet Take 1 tablet (4 mg total) by mouth every 8 (eight) hours as needed for nausea or vomiting. Patient not taking: Reported on 10/11/2014 10/01/14   Carlton Adam, PA-C    Physical Exam: Filed Vitals:   10/11/14 1813 10/11/14 2045 10/11/14 2127  BP: 121/39 109/83   Pulse: 83 81   Temp: 101.6 F (38.7 C) 101 F (38.3 C) 99.5 F (37.5 C)  TempSrc: Oral Oral Oral  Resp: 28 22   Weight: 90.719 kg (200 lb)    SpO2: 95% 96%    General: Not in acute distress HEENT:       Eyes: PERRL, EOMI, no scleral icterus.       ENT: No discharge from the ears and nose, no pharynx injection, no tonsillar enlargement.        Neck: No JVD, no bruit, no mass felt. Heme: No neck lymph node enlargement. Cardiac: S1/S2, RRR, No murmurs, No gallops or rubs. Pulm: No rales, wheezing, rhonchi or  rubs. Abd: Soft, nondistended, nontender, no rebound pain, no organomegaly, BS present. Ext: No pitting leg edema bilaterally. 2+DP/PT pulse bilaterally. Musculoskeletal: No joint deformities, No joint redness or warmth, no limitation of ROM in spin. Skin: No rashes.  Neuro: Alert, oriented X3, cranial nerves II-XII grossly intact, muscle strength 5/5 in all extremities, sensation to light touch intact.  Psych: Patient is not psychotic, no suicidal or hemocidal ideation.  Labs on Admission:  Basic Metabolic Panel:  Recent Labs Lab 10/10/14 1058 10/11/14 1900  NA 140 136  K 4.2 3.6  CL  --  106  CO2 26 19*  GLUCOSE 209* 183*  BUN 18.5 17  CREATININE 1.0 1.01  CALCIUM 9.4 8.6*  MG 2.0  --    Liver Function Tests:  Recent Labs Lab 10/10/14 1058 10/11/14 1900  AST 26 57*  ALT 43 70*  ALKPHOS 100 109  BILITOT 0.72 0.7  PROT 7.0 7.0  ALBUMIN 3.1* 3.3*   No results for input(s): LIPASE, AMYLASE in the last 168 hours. No results for input(s): AMMONIA in the last 168 hours. CBC:  Recent Labs Lab 10/10/14 1058 10/11/14 1900  WBC 1.3* 1.1*  NEUTROABS 0.0* 0.1*  HGB 12.3* 11.1*  HCT 36.1* 32.2*  MCV 90.7 90.7  PLT 83* 68*   Cardiac Enzymes: No results for input(s): CKTOTAL, CKMB, CKMBINDEX, TROPONINI in the last 168 hours.  BNP (last 3 results) No results for input(s): BNP in the last 8760 hours.  ProBNP (last 3 results) No results for input(s): PROBNP in the last 8760 hours.  CBG: No results for input(s): GLUCAP in the last 168 hours.  Radiological Exams on Admission: Dg Chest 2 View  10/11/2014   CLINICAL DATA:  Patient currently undergoing chemotherapy for metastatic left upper lobe lung cancer, most recent chemotherapy 1 week ago, seen at the [facility] yesterday and informed bat he has leukopenia, presenting with acute onset fever and chills 104 degrees F.  EXAM: CHEST  2 VIEW  COMPARISON:  PET-CT 09/28/2014. CT chest 09/07/2014 dating back to 06/29/2012.  Two-view chest x-ray 07/31/2011.  FINDINGS: Prior sternotomy for CABG. Cardiac silhouette moderately enlarged, unchanged. Left subclavian pacing defibrillator unchanged and appears intact.  Large left upper lobe lung  mass extending into the left hilum, with improvement in aeration of the left upper lobe since the PET-CT. Diffuse interstitial pulmonary fibrosis, unchanged. No new pulmonary parenchymal abnormalities elsewhere in either lung. Degenerative changes and DISH involving the thoracic spine.  IMPRESSION: 1. Large left upper lobe lung mass with improved aeration in the left upper lobe since the PET-CT 09/28/2014, likely a combination of improved post-obstructive atelectasis and tumor shrinkage after chemotherapy. 2. Chronic Interstitial pulmonary fibrosis. 3. No new/acute abnormalities. 4. Stable cardiomegaly without evidence pulmonary edema.   Electronically Signed   By: Evangeline Dakin M.D.   On: 10/11/2014 18:53    EKG: Independently reviewed.  Sinus rhythm with occasional PVC Assessment/Plan Principal Problem:   Neutropenia with fever Active Problems:   Type 2 diabetes mellitus with vascular disease   HLD (hyperlipidemia)   CAD (coronary artery disease)   Atrial fibrillation   Chronic systolic heart failure   History of left carotid endarteectomy   Automatic implantable cardioverter-defibrillator in situ   Long-term (current) use of anticoagulants   Rheumatoid lung disease   Small cell lung carcinoma   Sepsis   Neutropenic fever   Pancytopenia   Essential hypertension  Fever, neutropenia and sepsis: etiology is not clear. Patient is septic with neutropenia and fever. His hemodynamically stable on admission. Patient has cough, but chest x-ray is negative for infiltration. UA has trace amount of leukocyte, but patient is asymptomatic of UTI. - will admit to SUD - started vancomycin and Zosyn - blood culture x2,  and urine culture and sputum culture, Flu pcr - will get  Procalcitonin and trend lactic acid levels per sepsis protocol. - IVF: 100 cc/h (patient has a congestive heart failure, limiting aggressive IV fluids treatment).  DM-II: Last A1c 7.5 on 09/25/10, fairly controled. Patient is taking Levemir and Humalog at home -will decrease Lantus dose from 28-20 units daily -SSI  HLD: Last LDL was 54 on 09/26/10 -Continue home medications: Zocor  CAD (coronary artery disease): s/p of stent and CABG. No chest pain. Troponin negative in the emergency room. -Continue Coreg -On Zocor, Pradaxa which is for atrial fibrillation  Atrial Fibrillation: CHA2DS2-VASc Score is 6, needs oral anticoagulation. Patient is on Pradaxa at home.  Heart rate is well controlled. -Continue Pradaxa and coreg  Chronic systolic heart failure: 2-D echo on 07/06/12 showed EF 30-35%. Patient does not have any leg edema. Patient is not on diuretics at home. CHF compensated on admission.  -check BNP  HTN: -Continue coreg -Hold Losartan since patient is at risk of developing hypotension -IV hydralazine when necessary  Small cell lung carcinoma: Patient is being followed up and treated by Dr. Julien Nordmann. He is getting chemotherapy currently, last dose was last week. -follow up with Dr. Julien Nordmann  Pancytopenia: Most likely due to chemotherapy. Platelet 68, no bleeding tendency. -Follow-up CBC  DVT ppx: SCD  Code Status: Full code Family Communication:  Yes, patient's wife, daughter, grandson at bed side Disposition Plan: Admit to inpatient   Date of Service 10/11/2014    Ivor Costa [facility] Pager [phone]  If 7PM-7AM, please contact night-coverage [email or web address] Password [email or web address] 10/11/2014, 10:08 PM

## 2014-10-12 ENCOUNTER — Telehealth: Payer: Self-pay | Admitting: Internal Medicine

## 2014-10-12 DIAGNOSIS — E440 Moderate protein-calorie malnutrition: Secondary | ICD-10-CM | POA: Insufficient documentation

## 2014-10-12 DIAGNOSIS — D61818 Other pancytopenia: Secondary | ICD-10-CM

## 2014-10-12 DIAGNOSIS — C3490 Malignant neoplasm of unspecified part of unspecified bronchus or lung: Secondary | ICD-10-CM

## 2014-10-12 DIAGNOSIS — I4891 Unspecified atrial fibrillation: Secondary | ICD-10-CM

## 2014-10-12 DIAGNOSIS — D709 Neutropenia, unspecified: Secondary | ICD-10-CM

## 2014-10-12 LAB — GLUCOSE, CAPILLARY
GLUCOSE-CAPILLARY: 118 mg/dL — AB (ref 65–99)
Glucose-Capillary: 77 mg/dL (ref 65–99)
Glucose-Capillary: 110 mg/dL — ABNORMAL HIGH (ref 65–99)

## 2014-10-12 LAB — CBC WITH DIFFERENTIAL/PLATELET
BASOS ABS: 0 10*3/uL (ref 0.0–0.1)
Basophils Relative: 1 % (ref 0–1)
EOS PCT: 5 % (ref 0–5)
Eosinophils Absolute: 0.2 10*3/uL (ref 0.0–0.7)
HEMATOCRIT: 31 % — AB (ref 39.0–52.0)
Hemoglobin: 10.5 g/dL — ABNORMAL LOW (ref 13.0–17.0)
LYMPHS ABS: 1.7 10*3/uL (ref 0.7–4.0)
LYMPHS PCT: 58 % — AB (ref 12–46)
MCH: 30.9 pg (ref 26.0–34.0)
MCHC: 33.9 g/dL (ref 30.0–36.0)
MCV: 91.2 fL (ref 78.0–100.0)
MONOS PCT: 9 % (ref 3–12)
Monocytes Absolute: 0.3 10*3/uL (ref 0.1–1.0)
NEUTROS PCT: 27 % — AB (ref 43–77)
Neutro Abs: 0.8 10*3/uL — ABNORMAL LOW (ref 1.7–7.7)
Platelets: 50 10*3/uL — ABNORMAL LOW (ref 150–400)
RBC: 3.4 MIL/uL — AB (ref 4.22–5.81)
RDW: 12.8 % (ref 11.5–15.5)
WBC: 3 10*3/uL — AB (ref 4.0–10.5)

## 2014-10-12 LAB — URINE CULTURE: Culture: NO GROWTH

## 2014-10-12 LAB — CBC
HEMATOCRIT: 30.2 % — AB (ref 39.0–52.0)
HEMOGLOBIN: 10.1 g/dL — AB (ref 13.0–17.0)
MCH: 30.4 pg (ref 26.0–34.0)
MCHC: 33.4 g/dL (ref 30.0–36.0)
MCV: 91 fL (ref 78.0–100.0)
Platelets: 54 10*3/uL — ABNORMAL LOW (ref 150–400)
RBC: 3.32 MIL/uL — AB (ref 4.22–5.81)
RDW: 12.7 % (ref 11.5–15.5)
WBC: 2.1 10*3/uL — ABNORMAL LOW (ref 4.0–10.5)

## 2014-10-12 LAB — LACTIC ACID, PLASMA: LACTIC ACID, VENOUS: 1.2 mmol/L (ref 0.5–2.0)

## 2014-10-12 LAB — COMPREHENSIVE METABOLIC PANEL
ALBUMIN: 2.9 g/dL — AB (ref 3.5–5.0)
ALK PHOS: 97 U/L (ref 38–126)
ALT: 61 U/L (ref 17–63)
ANION GAP: 7 (ref 5–15)
AST: 41 U/L (ref 15–41)
BUN: 17 mg/dL (ref 6–20)
CALCIUM: 8.2 mg/dL — AB (ref 8.9–10.3)
CHLORIDE: 107 mmol/L (ref 101–111)
CO2: 22 mmol/L (ref 22–32)
Creatinine, Ser: 1.11 mg/dL (ref 0.61–1.24)
GFR calc non Af Amer: >60 mL/min (ref >60)
GLUCOSE: 111 mg/dL — AB (ref 65–99)
POTASSIUM: 3.4 mmol/L — AB (ref 3.5–5.1)
SODIUM: 136 mmol/L (ref 135–145)
Total Bilirubin: 0.8 mg/dL (ref 0.3–1.2)
Total Protein: 6.3 g/dL — ABNORMAL LOW (ref 6.5–8.1)

## 2014-10-12 LAB — MRSA PCR SCREENING: MRSA by PCR: NEGATIVE

## 2014-10-12 LAB — BRAIN NATRIURETIC PEPTIDE: B NATRIURETIC PEPTIDE 5: 412 pg/mL — AB (ref 0.0–100.0)

## 2014-10-12 MED ORDER — BENZONATATE 100 MG PO CAPS
100.0000 mg | ORAL_CAPSULE | Freq: Three times a day (TID) | ORAL | Status: DC
  Administered 2014-10-12 – 2014-10-13 (×5): 100 mg via ORAL
  Filled 2014-10-12 (×5): qty 1

## 2014-10-12 MED ORDER — POTASSIUM CHLORIDE CRYS ER 20 MEQ PO TBCR
40.0000 meq | EXTENDED_RELEASE_TABLET | Freq: Once | ORAL | Status: AC
  Administered 2014-10-12: 40 meq via ORAL
  Filled 2014-10-12: qty 2

## 2014-10-12 NOTE — Progress Notes (Addendum)
[facility] Progress Note   Karl [name]  IWL:[id number]  DOB: April 26, 1935  DOA: 10/11/2014 PCP: [name], MD  Brief narrative: Karl [name] is a 79 y.o. male with recently diagnosed small cell lung cancer and chemotherapy, systolic heart failure, CAD status post stent and CABG, A. fib on Pradaxa, rheumatoid arthritis, diabetes mellitus, hypertension who presents to the hospital with a fever. Temperature 102.7 degrees at home.   Subjective: The patient states fever started about 1 week ago on the day that he received Neulasta. He has a cough with brown mucus which has been going on for about 2 weeks. No shortness of breath. No dysuria. No nausea vomiting diarrhea or rash.  Assessment/Plan: Principal Problem:   Neutropenia with fever - No clear source as of yet other than possibly a bronchitis-he's had a dry cough since the diagnosis of his cancer but this has been productive for about 2 weeks now  - CXR negative for infiltrate-no crackles on exam-no dyspnea or hypoxia  - continue current antibiotics and follow cultures  Active Problems: Pancytopenia -Received first chemotherapy last week  -Leukocytes Improving-received Neulasta last week Friday -Continue to follow cell counts  Small cell lung carcinoma -Diagnosed in August of this year -On chemotherapy per Dr. Julien Nordmann    Type 2 diabetes mellitus with vascular disease -Continue current dose of Levemir and sliding scale-sugars stable    CAD (coronary artery disease) - CABG/stent    Atrial fibrillation- paroxysmal -Currently in sinus rhythm -Continue Tikosyn, Pradaxa  Hypokalemia -Replace to keep level > 4    Chronic systolic heart failure -DC IV fluids today-not on diuretics at home  Hypertension - Continue Coreg, losartan    History of left carotid endarteectomy    Automatic implantable cardioverter-defibrillator in situ    Rheumatoid lung disease -Follows with Dr. Elsworth Soho for pulmonary fibrosis- no  issues with hypoxia    Code Status:     Code Status Orders        Start     Ordered   10/11/14 2149  Full code   Continuous     10/11/14 2149     Family Communication: Wife at bedside Disposition Plan: Home when stable DVT prophylaxis: Pradaxa Consultants: Procedures: Appt with PCP: requested Antibiotics: Anti-infectives    Start     Dose/Rate Route Frequency Ordered Stop   10/12/14 0400  piperacillin-tazobactam (ZOSYN) IVPB 3.375 g     3.375 g 12.5 mL/hr over 240 Minutes Intravenous Every 8 hours 10/11/14 1913     10/11/14 2000  vancomycin (VANCOCIN) IVPB 1000 mg/200 mL premix     1,000 mg 200 mL/hr over 60 Minutes Intravenous Every 12 hours 10/11/14 1913     10/11/14 1915  piperacillin-tazobactam (ZOSYN) IVPB 3.375 g     3.375 g 100 mL/hr over 30 Minutes Intravenous STAT 10/11/14 1913 10/11/14 2012      Objective: Filed Weights   10/11/14 1813 10/12/14 0310  Weight: 90.719 kg (200 lb) 90.7 kg (199 lb 15.3 oz)    Intake/Output Summary (Last 24 hours) at 10/12/14 1241 Last data filed at 10/12/14 0700  Gross per 24 hour  Intake 1513.33 ml  Output      0 ml  Net 1513.33 ml     Vitals Filed Vitals:   10/12/14 0100 10/12/14 0200 10/12/14 0310 10/12/14 0801  BP: 118/46 118/55 141/41 125/87  Pulse: 89 80 83 77  Temp:   101.4 F (38.6 C) 97.6 F (36.4 C)  TempSrc:   Oral Oral  Resp: '30 25 29   '$ Height:   '5\' 11"'$  (1.803 m)   Weight:   90.7 kg (199 lb 15.3 oz)   SpO2: 91% 92% 95%     Exam:  General:  Pt is alert, not in acute distress  HEENT: No icterus, No thrush, oral mucosa moist  Cardiovascular: regular rate and rhythm, S1/S2 No murmur  Respiratory: clear to auscultation bilaterally   Abdomen: Soft, +Bowel sounds, non tender, non distended, no guarding  MSK: No LE edema, cyanosis or clubbing  Data Reviewed: Basic Metabolic Panel:  Recent Labs Lab 10/10/14 1058 10/11/14 1900 10/12/14 0353  NA 140 136 136  K 4.2 3.6 3.4*  CL  --  106  107  CO2 26 19* 22  GLUCOSE 209* 183* 111*  BUN 18.'5 17 17  '$ CREATININE 1.0 1.01 1.11  CALCIUM 9.4 8.6* 8.2*  MG 2.0  --   --    Liver Function Tests:  Recent Labs Lab 10/10/14 1058 10/11/14 1900 10/12/14 0353  AST 26 57* 41  ALT 43 70* 61  ALKPHOS 100 109 97  BILITOT 0.72 0.7 0.8  PROT 7.0 7.0 6.3*  ALBUMIN 3.1* 3.3* 2.9*   No results for input(s): LIPASE, AMYLASE in the last 168 hours. No results for input(s): AMMONIA in the last 168 hours. CBC:  Recent Labs Lab 10/10/14 1058 10/11/14 1900 10/12/14 0353  WBC 1.3* 1.1* 2.1*  NEUTROABS 0.0* 0.1*  --   HGB 12.3* 11.1* 10.1*  HCT 36.1* 32.2* 30.2*  MCV 90.7 90.7 91.0  PLT 83* 68* 54*   Cardiac Enzymes: No results for input(s): CKTOTAL, CKMB, CKMBINDEX, TROPONINI in the last 168 hours. BNP (last 3 results)  Recent Labs  10/12/14 0353  BNP 412.0*    ProBNP (last 3 results) No results for input(s): PROBNP in the last 8760 hours.  CBG:  Recent Labs Lab 10/11/14 2216 10/11/14 2326 10/12/14 0742  GLUCAP 123* 127* 77    Recent Results (from the past 240 hour(s))  Culture, blood (routine x 2)     Status: None (Preliminary result)   Collection Time: 10/11/14  7:08 PM  Result Value Ref Range Status   Specimen Description BLOOD BLOOD LEFT FOREARM  Final   Special Requests BOTTLES DRAWN AEROBIC AND ANAEROBIC 5CC  Final   Culture PENDING  Incomplete   Report Status PENDING  Incomplete  MRSA PCR Screening     Status: None   Collection Time: 10/12/14  3:07 AM  Result Value Ref Range Status   MRSA by PCR NEGATIVE NEGATIVE Final    Comment:        The GeneXpert MRSA Assay (FDA approved for NASAL specimens only), is one component of a comprehensive MRSA colonization surveillance program. It is not intended to diagnose MRSA infection nor to guide or monitor treatment for MRSA infections.      Studies: Dg Chest 2 View  10/11/2014   CLINICAL DATA:  Patient currently undergoing chemotherapy for metastatic  left upper lobe lung cancer, most recent chemotherapy 1 week ago, seen at the [facility] yesterday and informed bat he has leukopenia, presenting with acute onset fever and chills 104 degrees F.  EXAM: CHEST  2 VIEW  COMPARISON:  PET-CT 09/28/2014. CT chest 09/07/2014 dating back to 06/29/2012. Two-view chest x-ray 07/31/2011.  FINDINGS: Prior sternotomy for CABG. Cardiac silhouette moderately enlarged, unchanged. Left subclavian pacing defibrillator unchanged and appears intact.  Large left upper lobe lung mass extending into the left hilum, with improvement in aeration of  the left upper lobe since the PET-CT. Diffuse interstitial pulmonary fibrosis, unchanged. No new pulmonary parenchymal abnormalities elsewhere in either lung. Degenerative changes and DISH involving the thoracic spine.  IMPRESSION: 1. Large left upper lobe lung mass with improved aeration in the left upper lobe since the PET-CT 09/28/2014, likely a combination of improved post-obstructive atelectasis and tumor shrinkage after chemotherapy. 2. Chronic Interstitial pulmonary fibrosis. 3. No new/acute abnormalities. 4. Stable cardiomegaly without evidence pulmonary edema.   Electronically Signed   By: Evangeline Dakin M.D.   On: 10/11/2014 18:53    Scheduled Meds:  Scheduled Meds: . carvedilol  6.25 mg Oral BID WC  . dabigatran  150 mg Oral BID  . dextromethorphan-guaiFENesin  1 tablet Oral BID  . dofetilide  250 mcg Oral BID  . insulin aspart  0-9 Units Subcutaneous TID WC  . insulin detemir  20 Units Subcutaneous Daily  . omega-3 acid ethyl esters  1 g Oral Daily  . piperacillin-tazobactam (ZOSYN)  IV  3.375 g Intravenous Q8H  . potassium chloride  40 mEq Oral Once  . simvastatin  40 mg Oral Daily  . sodium chloride  3 mL Intravenous Q12H  . vancomycin  1,000 mg Intravenous Q12H  . vitamin B-12  2,500 mcg Oral Daily   Continuous Infusions:    Time spent on care of this patient: 10 min   [place], MD 10/12/2014,  12:41 PM  LOS: 1 day   [facility] Office  [phone] Pager - Text Page per [email or web address] If 7PM-7AM, please contact night-coverage [email or web address]

## 2014-10-12 NOTE — Evaluation (Signed)
Physical Therapy Evaluation Patient Details Name: Karl [name] MRN: [id number] DOB: 1935/02/23 Today's Date: 10/12/2014   History of Present Illness  Karl [name] is a 79 y.o. male with PMH of recently diagnosed small cell lung cancer(currently on chemotherapy), hypertension, hyperlipidemia, diabetes mellitus, CAD (s/p of stent and CABG), carotid artery stenosis, AICD, systolic congestive heart failure (EF 30-35%), atrial fibrillation on Pradaxa, rheumatoid arthritis, who presents with fever.  Clinical Impression  Patient tolerated ambulation x 300' very well. Patient will benefit from PT to address problems listed in note below.    Follow Up Recommendations Home health PT;Supervision - Intermittent    Equipment Recommendations  Rolling walker with 5" wheels    Recommendations for Other Services       Precautions / Restrictions Precautions Precautions: Fall Precaution Comments: DOE, monitor sats and HR, Restrictions Weight Bearing Restrictions: No      Mobility  Bed Mobility Overal bed mobility: Modified Independent Bed Mobility: Supine to Sit     Supine to sit: Modified independent (Device/Increase time);HOB elevated     General bed mobility comments: increased time  Transfers Overall transfer level: Needs assistance Equipment used: Rolling walker (2 wheeled) Transfers: Sit to/from Stand Sit to Stand: Min guard         General transfer comment: cues for hand placement , to push up,reach back  Ambulation/Gait Ambulation/Gait assistance: Min assist Ambulation Distance (Feet): 300 Feet Assistive device: Rolling walker (2 wheeled) Gait Pattern/deviations: Step-through pattern     General Gait Details: wfl, stopped x 2 to monitor sats. HR and sats WFL, dyspnea 3/4 when walked to [place], while in hall 2/4  Stairs            Wheelchair Mobility    Modified Rankin (Stroke Patients Only)       Balance Overall balance assessment: Needs  assistance Sitting-balance support: No upper extremity supported;Feet supported Sitting balance-Leahy Scale: Good     Standing balance support: Bilateral upper extremity supported Standing balance-Leahy Scale: Fair                               Pertinent Vitals/Pain Pain Assessment: Faces Faces Pain Scale: Hurts a little bit Pain Location: L knee-chronic Pain Descriptors / Indicators: Aching Pain Intervention(s): Monitored during session    Home Living Family/patient expects to be discharged to:: Private residence Living Arrangements: Spouse/significant other Available Help at Discharge: Family;Available PRN/intermittently Type of Home: House Home Access: Stairs to enter Entrance Stairs-Rails: None Entrance Stairs-Number of Steps: 1 Home Layout: One level Home Equipment: None      Prior Function Level of Independence: Independent               Hand Dominance   Dominant Hand: Right    Extremity/Trunk Assessment   Upper Extremity Assessment: Overall WFL for tasks assessed           Lower Extremity Assessment: Overall WFL for tasks assessed      Cervical / Trunk Assessment: Normal  Communication   Communication: No difficulties  Cognition Arousal/Alertness: Awake/alert Behavior During Therapy: WFL for tasks assessed/performed Overall Cognitive Status: Within Functional Limits for tasks assessed                      General Comments      Exercises        Assessment/Plan    PT Assessment Patient needs continued PT services  PT Diagnosis Generalized weakness  PT Problem List Decreased strength;Decreased activity tolerance;Decreased balance;Decreased mobility;Decreased knowledge of precautions;Cardiopulmonary status limiting activity  PT Treatment Interventions DME instruction;Gait training;Functional mobility training;Therapeutic activities;Therapeutic exercise;Patient/family education   PT Goals (Current goals can be found  in the Care Plan section) Acute Rehab PT Goals Patient Stated Goal: to get back home PT Goal Formulation: With patient/family Time For Goal Achievement: 10/26/14 Potential to Achieve Goals: Good    Frequency Min 3X/week   Barriers to discharge        Co-evaluation PT/OT/SLP Co-Evaluation/Treatment: Yes Reason for Co-Treatment: For patient/therapist safety PT goals addressed during session: Mobility/safety with mobility OT goals addressed during session: Strengthening/ROM;ADL's and self-care       End of Session Equipment Utilized During Treatment: Gait belt Activity Tolerance: Patient tolerated treatment well Patient left: in chair;with call bell/phone within reach;with family/visitor present Nurse Communication: Mobility status         Time: 2761-8485 PT Time Calculation (min) (ACUTE ONLY): 29 min   Charges:   PT Evaluation $Initial PT Evaluation Tier I: 1 Procedure     PT G CodesClaretha Cooper 10/12/2014, 2:23 PM  Tresa Endo PT [phone]

## 2014-10-12 NOTE — Progress Notes (Signed)
Initial Nutrition Assessment  DOCUMENTATION CODES:   Non-severe (moderate) malnutrition in context of acute illness/injury  INTERVENTION:  - Encourage PO intake at meals - RD will continue to monitor for needs  NUTRITION DIAGNOSIS:   Increased nutrient needs related to catabolic illness, cancer and cancer related treatments as evidenced by estimated needs.  GOAL:   Patient will meet greater than or equal to 90% of their needs  MONITOR:   PO intake, Weight trends, Labs, I & O's  REASON FOR ASSESSMENT:   Malnutrition Screening Tool  ASSESSMENT:   79 y.o. male with PMH of recently diagnosed small cell lung cancer(currently on chemotherapy), hypertension, hyperlipidemia, diabetes mellitus, CAD (s/p of stent and CABG), carotid artery stenosis, AICD, systolic congestive heart failure (EF 30-35%), atrial fibrillation on Pradaxa, rheumatoid arthritis, who presents with fever.  Pt seen for MST. BMI indicates overweight status. Pt reports that he recently ordered a [place] burger for lunch. For breakfast he ate 100% of eggs, grits, and sausage and denies abdominal pain or nausea associated with intake of this meal.   Pt states that appetite has been good since admission but had been decreased x1 week PTA; states poor appetite began about the time he began chemotherapy. He states that since starting chemo he has experienced taste alterations but he is unable to give further information on this to describe it.   At home he was drinking Ensure but noted that AM blood sugar would be high so he was encouraged to switch to Glucerna. He did not have a chance to try this supplement before admission. Declines offer for Glucerna to be ordered during hospitalization.   He states that over the past 3 weeks he has lost ~10 lbs. Per chart review, pt has lost 12 lbs (6% body weight) in the past 1 month which is significant for time frame.  No muscle or fat wasting noted at this time. Likely meeting needs  during hospitalization but not PTA. Medications reviewed. Labs reviewed; CBGs: 77-127 mg/dL, K: 3.4 mmol/L, Ca: 8.2 mg/dL.   Diet Order:  Diet Heart Room service appropriate?: Yes; Fluid consistency:: Thin  Skin:  Reviewed, no issues  Last BM:  9/9  Height:   Ht Readings from Last 1 Encounters:  10/12/14 '5\' 11"'$  (1.803 m)    Weight:   Wt Readings from Last 1 Encounters:  10/12/14 199 lb 15.3 oz (90.7 kg)    Ideal Body Weight:  78.18 kg (kg)  BMI:  Body mass index is 27.9 kg/(m^2).  Estimated Nutritional Needs:   Kcal:  5176-1607  Protein:  90-100 grams  Fluid:  2.2-2.5 L/day  EDUCATION NEEDS:   No education needs identified at this time     Jarome Matin, RD, LDN Inpatient Clinical Dietitian Pager # [phone] After hours/weekend pager # [phone]

## 2014-10-12 NOTE — Evaluation (Signed)
Occupational Therapy Evaluation Patient Details Name: Karl [name] MRN: [id number] DOB: 09-03-1935 Today's Date: 10/12/2014    History of Present Illness Karl [name] is a 79 y.o. male with PMH of recently diagnosed small cell lung cancer(currently on chemotherapy), hypertension, hyperlipidemia, diabetes mellitus, CAD (s/p of stent and CABG), carotid artery stenosis, AICD, systolic congestive heart failure (EF 30-35%), atrial fibrillation on Pradaxa, rheumatoid arthritis, who presents with fever.   Clinical Impression   This 79 yo male admitted with above presents to acute OT with DOE, generalized weakness, decreased balance all affecting his ability to care for himself at an independent level like he was of last week. He will benefit from continued OT without need for follow up.    Follow Up Recommendations  No OT follow up    Equipment Recommendations   (tub seat (family may get on their own))       Precautions / Restrictions Precautions Precautions: Fall Precaution Comments: DOE Restrictions Weight Bearing Restrictions: No      Mobility Bed Mobility Overal bed mobility: Modified Independent Bed Mobility: Supine to Sit     Supine to sit: Modified independent (Device/Increase time);HOB elevated     General bed mobility comments: increased time  Transfers Overall transfer level: Needs assistance Equipment used: Rolling walker (2 wheeled) Transfers: Sit to/from Stand Sit to Stand: Min guard              Balance Overall balance assessment: Needs assistance Sitting-balance support: No upper extremity supported;Feet supported Sitting balance-Leahy Scale: Good     Standing balance support: Bilateral upper extremity supported Standing balance-Leahy Scale: Fair                              ADL Overall ADL's : Needs assistance/impaired Eating/Feeding: Independent;Sitting   Grooming: Set up;Sitting   Upper Body Bathing: Set up;Sitting   Lower  Body Bathing: Minimal assistance;Sit to/from stand   Upper Body Dressing : Set up;Sitting   Lower Body Dressing: Minimal assistance;Sit to/from stand   Toilet Transfer: Minimal assistance;Ambulation (stand at toilet to urinate)   Toileting- Clothing Manipulation and Hygiene: Minimal assistance;Sit to/from stand         General ADL Comments: Advised pt and wife that pt would benefit from a seat for him to sit on in shower--so he does not get so worn out with bathing.     Vision Additional Comments: No change from baseline          Pertinent Vitals/Pain Pain Assessment: No/denies pain     Hand Dominance Right   Extremity/Trunk Assessment Upper Extremity Assessment Upper Extremity Assessment: Overall WFL for tasks assessed   Lower Extremity Assessment Lower Extremity Assessment: Defer to PT evaluation       Communication Communication Communication: No difficulties   Cognition Arousal/Alertness: Awake/alert Behavior During Therapy: WFL for tasks assessed/performed Overall Cognitive Status: Within Functional Limits for tasks assessed                                Home Living Family/patient expects to be discharged to:: Private residence Living Arrangements: Spouse/significant other Available Help at Discharge: Family;Available PRN/intermittently Type of Home: House Home Access: Stairs to enter Entrance Stairs-Number of Steps: 1 Entrance Stairs-Rails: None Home Layout: One level     Bathroom Shower/Tub: Occupational psychologist: Standard     Home Equipment: None  Prior Functioning/Environment Level of Independence: Independent             OT Diagnosis: Generalized weakness   OT Problem List: Impaired balance (sitting and/or standing);Decreased strength;Cardiopulmonary status limiting activity   OT Treatment/Interventions: Self-care/ADL training;Patient/family education;Energy conservation;DME and/or AE instruction     OT Goals(Current goals can be found in the care plan section) Acute Rehab OT Goals Patient Stated Goal: to get back home OT Goal Formulation: With patient Time For Goal Achievement: 10/19/14 Potential to Achieve Goals: Good  OT Frequency: Min 2X/week           Co-evaluation PT/OT/SLP Co-Evaluation/Treatment: Yes Reason for Co-Treatment: For patient/therapist safety   OT goals addressed during session: Strengthening/ROM;ADL's and self-care      End of Session Equipment Utilized During Treatment: Rolling walker  Activity Tolerance: Patient tolerated treatment well Patient left: in chair;with call bell/phone within reach;with family/visitor present   Time: 1610-9604 OT Time Calculation (min): 29 min Charges:  OT General Charges $OT Visit: 1 Procedure OT Evaluation $Initial OT Evaluation Tier I: 1 Procedure  Almon Register [phone] 10/12/2014, 1:24 PM

## 2014-10-12 NOTE — Telephone Encounter (Signed)
Appointment added and chemo altered for 9/21,patient will get a new avs at 9/14 appointment

## 2014-10-12 NOTE — Care Management Note (Signed)
Case Management Note  Patient Details  Name: VERE DIANTONIO MRN: [id number] Date of Birth: Jan 07, 1936  Subjective/Objective:         sepsis           Action/Plan:Date:  Sept.9, 2016 U.R. performed for needs and level of care. Will continue to follow for Case Management needs.  Velva Harman, RN, BSN, [place]   [phone]   Expected Discharge Date:   (unknown)               Expected Discharge Plan:  Home/Self Care  In-House Referral:  Clinical Social Work  Discharge planning Services  CM Consult  Post Acute Care Choice:  NA Choice offered to:  NA  DME Arranged:    DME Agency:     HH Arranged:    [place] Agency:     Status of Service:  In process, will continue to follow  Medicare Important Message Given:    Date Medicare IM Given:    Medicare IM give by:    Date Additional Medicare IM Given:    Additional Medicare Important Message give by:     If discussed at [place] of Stay Meetings, dates discussed:    Additional Comments:  Leeroy Cha, RN 10/12/2014, 11:49 AM

## 2014-10-12 NOTE — Patient Instructions (Signed)
Take Excess Prescribed. Take the [employer] Also As Prescribed Continue weekly labs as scheduled Follow-up in 2 weeks prior to start of your next scheduled cycle of chemotherapy

## 2014-10-13 DIAGNOSIS — I480 Paroxysmal atrial fibrillation: Secondary | ICD-10-CM

## 2014-10-13 DIAGNOSIS — J209 Acute bronchitis, unspecified: Secondary | ICD-10-CM

## 2014-10-13 DIAGNOSIS — I5022 Chronic systolic (congestive) heart failure: Secondary | ICD-10-CM

## 2014-10-13 LAB — CBC WITH DIFFERENTIAL/PLATELET
BASOS ABS: 0 10*3/uL (ref 0.0–0.1)
BASOS PCT: 1 % (ref 0–1)
Basophils Absolute: 0 10*3/uL (ref 0.0–0.1)
Basophils Relative: 0 % (ref 0–1)
EOS PCT: 4 % (ref 0–5)
Eosinophils Absolute: 0.1 10*3/uL (ref 0.0–0.7)
Eosinophils Absolute: 0.1 10*3/uL (ref 0.0–0.7)
Eosinophils Relative: 4 % (ref 0–5)
HCT: 30.8 % — ABNORMAL LOW (ref 39.0–52.0)
HCT: 31.7 % — ABNORMAL LOW (ref 39.0–52.0)
HEMOGLOBIN: 10.3 g/dL — AB (ref 13.0–17.0)
Hemoglobin: 10.6 g/dL — ABNORMAL LOW (ref 13.0–17.0)
LYMPHS ABS: 1.3 10*3/uL (ref 0.7–4.0)
LYMPHS PCT: 41 % (ref 12–46)
Lymphocytes Relative: 49 % — ABNORMAL HIGH (ref 12–46)
Lymphs Abs: 1.4 10*3/uL (ref 0.7–4.0)
MCH: 30.6 pg (ref 26.0–34.0)
MCH: 30.5 pg (ref 26.0–34.0)
MCHC: 33.4 g/dL (ref 30.0–36.0)
MCHC: 33.4 g/dL (ref 30.0–36.0)
MCV: 91.4 fL (ref 78.0–100.0)
MCV: 91.1 fL (ref 78.0–100.0)
MONO ABS: 0.2 10*3/uL (ref 0.1–1.0)
MONOS PCT: 11 % (ref 3–12)
MONOS PCT: 6 % (ref 3–12)
Monocytes Absolute: 0.3 10*3/uL (ref 0.1–1.0)
NEUTROS ABS: 1 10*3/uL — AB (ref 1.7–7.7)
NEUTROS PCT: 35 % — AB (ref 43–77)
Neutro Abs: 1.5 10*3/uL — ABNORMAL LOW (ref 1.7–7.7)
Neutrophils Relative %: 48 % (ref 43–77)
PLATELETS: 50 10*3/uL — AB (ref 150–400)
Platelets: 52 10*3/uL — ABNORMAL LOW (ref 150–400)
RBC: 3.37 MIL/uL — AB (ref 4.22–5.81)
RBC: 3.48 MIL/uL — ABNORMAL LOW (ref 4.22–5.81)
RDW: 13.1 % (ref 11.5–15.5)
RDW: 13 % (ref 11.5–15.5)
WBC: 2.8 10*3/uL — ABNORMAL LOW (ref 4.0–10.5)
WBC: 3.1 10*3/uL — ABNORMAL LOW (ref 4.0–10.5)

## 2014-10-13 LAB — BASIC METABOLIC PANEL
Anion gap: 6 (ref 5–15)
BUN: 12 mg/dL (ref 6–20)
CHLORIDE: 110 mmol/L (ref 101–111)
CO2: 23 mmol/L (ref 22–32)
Calcium: 8.5 mg/dL — ABNORMAL LOW (ref 8.9–10.3)
Creatinine, Ser: 0.89 mg/dL (ref 0.61–1.24)
GFR calc non Af Amer: >60 mL/min (ref >60)
Glucose, Bld: 74 mg/dL (ref 65–99)
POTASSIUM: 3.5 mmol/L (ref 3.5–5.1)
SODIUM: 139 mmol/L (ref 135–145)

## 2014-10-13 LAB — INFLUENZA PANEL BY PCR (TYPE A & B)
H1N1 flu by pcr: NOT DETECTED
INFLAPCR: NEGATIVE
Influenza B By PCR: NEGATIVE

## 2014-10-13 LAB — GLUCOSE, CAPILLARY
Glucose-Capillary: 122 mg/dL — ABNORMAL HIGH (ref 65–99)
Glucose-Capillary: 75 mg/dL (ref 65–99)
Glucose-Capillary: 171 mg/dL — ABNORMAL HIGH (ref 65–99)
Glucose-Capillary: 154 mg/dL — ABNORMAL HIGH (ref 65–99)
Glucose-Capillary: 144 mg/dL — ABNORMAL HIGH (ref 65–99)

## 2014-10-13 MED ORDER — POTASSIUM CHLORIDE CRYS ER 20 MEQ PO TBCR
40.0000 meq | EXTENDED_RELEASE_TABLET | ORAL | Status: AC
  Administered 2014-10-13 (×2): 40 meq via ORAL
  Filled 2014-10-13 (×2): qty 2

## 2014-10-13 MED ORDER — AMOXICILLIN-POT CLAVULANATE 875-125 MG PO TABS
1.0000 | ORAL_TABLET | Freq: Two times a day (BID) | ORAL | Status: DC
  Administered 2014-10-13 (×2): 1 via ORAL
  Filled 2014-10-13 (×2): qty 1

## 2014-10-13 MED ORDER — DOXYCYCLINE HYCLATE 100 MG PO TABS
100.0000 mg | ORAL_TABLET | Freq: Two times a day (BID) | ORAL | Status: DC
  Administered 2014-10-13 (×2): 100 mg via ORAL
  Filled 2014-10-13 (×2): qty 1

## 2014-10-13 MED ORDER — HYDROCOD POLST-CPM POLST ER 10-8 MG/5ML PO SUER
5.0000 mL | Freq: Two times a day (BID) | ORAL | Status: DC
  Administered 2014-10-13 (×2): 5 mL via ORAL
  Filled 2014-10-13 (×2): qty 5

## 2014-10-13 NOTE — Progress Notes (Addendum)
[facility] Progress Note   Karl [name]  DGL:[id number]  DOB: 12/01/1935  DOA: 10/11/2014 PCP: [name], MD  Brief narrative: Karl [name] is a 79 y.o. male with recently diagnosed small cell lung cancer and chemotherapy, systolic heart failure, CAD status post stent and CABG, A. fib on Pradaxa, rheumatoid arthritis, diabetes mellitus, hypertension who presents to the hospital with a fever. Temperature 102.7 degrees at home. The patient states fever started about 1 week ago on the day that he received Neulasta. He has a cough with brown mucus which has been going on for about 2 weeks. No shortness of breath. No dysuria. No nausea vomiting diarrhea or rash.  Subjective: Cough now non- productive but still significant enough to bother him- tessalon and dext/guaif not effective enough. No new symptoms. Eating well, ambulating.   Assessment/Plan: Principal Problem:   Neutropenia with fever - No clear source as of yet other than possibly a bronchitis-he's had a dry cough since the diagnosis of his cancer but this has been productive for about 2 weeks now - this has resolved possibly from antibiotic treatment - CXR negative for infiltrate-no crackles on exam-no dyspnea or hypoxia  - cultures negative - transition vanc and Zosyn to Doxy and Augmentin and d/c home tomorrow if no fevers  Active Problems:  Acute Bronchitis - see above - add Tussionex  Pancytopenia -Received first chemotherapy last week  -Leukocytes Improving-received Neulasta last week Friday- no longer neutropenic -Continue to follow cell counts- no need for transfusions  Small cell lung carcinoma -Diagnosed in August of this year -On chemotherapy per Dr. Julien Nordmann    Type 2 diabetes mellitus with vascular disease -Continue current dose of Levemir and sliding scale-sugars stable    CAD (coronary artery disease) - CABG/stent    Atrial fibrillation- paroxysmal -Currently in sinus rhythm -Continue  Tikosyn, Pradaxa  Hypokalemia -Replace to keep level > 4    Chronic systolic heart failure - compensated -DC'd IV fluids -not on diuretics at home  Hypertension - Continue Coreg, losartan    History of left carotid endarteectomy    Automatic implantable cardioverter-defibrillator in situ    Rheumatoid lung disease -Follows with Dr. Elsworth Soho for pulmonary fibrosis- no issues with hypoxia    Code Status:     Code Status Orders        Start     Ordered   10/11/14 2149  Full code   Continuous     10/11/14 2149     Family Communication: Wife at bedside Disposition Plan: Home when stable DVT prophylaxis: Pradaxa Consultants: Procedures: Appt with PCP: requested Antibiotics: Anti-infectives    Start     Dose/Rate Route Frequency Ordered Stop   10/13/14 1000  doxycycline (VIBRA-TABS) tablet 100 mg     100 mg Oral Every 12 hours 10/13/14 0817     10/13/14 1000  amoxicillin-clavulanate (AUGMENTIN) 875-125 MG per tablet 1 tablet     1 tablet Oral 2 times daily 10/13/14 0817     10/12/14 0400  piperacillin-tazobactam (ZOSYN) IVPB 3.375 g  Status:  Discontinued     3.375 g 12.5 mL/hr over 240 Minutes Intravenous Every 8 hours 10/11/14 1913 10/13/14 0817   10/11/14 2000  vancomycin (VANCOCIN) IVPB 1000 mg/200 mL premix  Status:  Discontinued     1,000 mg 200 mL/hr over 60 Minutes Intravenous Every 12 hours 10/11/14 1913 10/13/14 0817   10/11/14 1915  piperacillin-tazobactam (ZOSYN) IVPB 3.375 g     3.375 g 100 mL/hr over 30  Minutes Intravenous STAT 10/11/14 1913 10/11/14 2012      Objective: Filed Weights   10/11/14 1813 10/12/14 0310 10/13/14 0452  Weight: 90.719 kg (200 lb) 90.7 kg (199 lb 15.3 oz) 95.5 kg (210 lb 8.6 oz)    Intake/Output Summary (Last 24 hours) at 10/13/14 1055 Last data filed at 10/13/14 0412  Gross per 24 hour  Intake      0 ml  Output    350 ml  Net   -350 ml     Vitals Filed Vitals:   10/12/14 1500 10/12/14 2020 10/13/14 0452 10/13/14  0800  BP: 128/68 109/45 123/49 123/54  Pulse: 78 75 68 70  Temp: 98 F (36.7 C) 97.8 F (36.6 C) 98.2 F (36.8 C)   TempSrc: Oral Oral Oral   Resp: '20 22 20   '$ Height:      Weight:   95.5 kg (210 lb 8.6 oz)   SpO2: 95% 95% 93%     Exam:  General:  Pt is alert, not in acute distress  HEENT: No icterus, No thrush, oral mucosa moist  Cardiovascular: regular rate and rhythm, S1/S2 No murmur  Respiratory: clear to auscultation bilaterally   Abdomen: Soft, +Bowel sounds, non tender, non distended, no guarding  MSK: No LE edema, cyanosis or clubbing  Data Reviewed: Basic Metabolic Panel:  Recent Labs Lab 10/10/14 1058 10/11/14 1900 10/12/14 0353 10/13/14 0508  NA 140 136 136 139  K 4.2 3.6 3.4* 3.5  CL  --  106 107 110  CO2 26 19* 22 23  GLUCOSE 209* 183* 111* 74  BUN 18.'5 17 17 12  '$ CREATININE 1.0 1.01 1.11 0.89  CALCIUM 9.4 8.6* 8.2* 8.5*  MG 2.0  --   --   --    Liver Function Tests:  Recent Labs Lab 10/10/14 1058 10/11/14 1900 10/12/14 0353  AST 26 57* 41  ALT 43 70* 61  ALKPHOS 100 109 97  BILITOT 0.72 0.7 0.8  PROT 7.0 7.0 6.3*  ALBUMIN 3.1* 3.3* 2.9*   No results for input(s): LIPASE, AMYLASE in the last 168 hours. No results for input(s): AMMONIA in the last 168 hours. CBC:  Recent Labs Lab 10/10/14 1058 10/11/14 1900 10/12/14 0353 10/12/14 1345 10/13/14 0508 10/13/14 0846  WBC 1.3* 1.1* 2.1* 3.0* 2.8* 3.1*  NEUTROABS 0.0* 0.1*  --  0.8* 1.0* 1.5*  HGB 12.3* 11.1* 10.1* 10.5* 10.3* 10.6*  HCT 36.1* 32.2* 30.2* 31.0* 30.8* 31.7*  MCV 90.7 90.7 91.0 91.2 91.4 91.1  PLT 83* 68* 54* 50* 52* 50*   Cardiac Enzymes: No results for input(s): CKTOTAL, CKMB, CKMBINDEX, TROPONINI in the last 168 hours. BNP (last 3 results)  Recent Labs  10/12/14 0353  BNP 412.0*    ProBNP (last 3 results) No results for input(s): PROBNP in the last 8760 hours.  CBG:  Recent Labs Lab 10/12/14 0742 10/12/14 1336 10/12/14 1630 10/12/14 2152  10/13/14 0725  GLUCAP 77 110* 118* 122* 75    Recent Results (from the past 240 hour(s))  Urine culture     Status: None   Collection Time: 10/11/14  6:49 PM  Result Value Ref Range Status   Specimen Description URINE, CLEAN CATCH  Final   Special Requests NONE  Final   Culture   Final    NO GROWTH 1 DAY Performed at [facility]    Report Status 10/12/2014 FINAL  Final  Culture, blood (routine x 2)     Status: None (Preliminary result)  Collection Time: 10/11/14  7:08 PM  Result Value Ref Range Status   Specimen Description BLOOD BLOOD LEFT FOREARM  Final   Special Requests BOTTLES DRAWN AEROBIC AND ANAEROBIC 5CC  Final   Culture   Final    NO GROWTH 1 DAY Performed at [facility]    Report Status PENDING  Incomplete  Culture, blood (routine x 2)     Status: None (Preliminary result)   Collection Time: 10/11/14  7:25 PM  Result Value Ref Range Status   Specimen Description BLOOD RIGHT FOREARM  Final   Special Requests BOTTLES DRAWN AEROBIC AND ANAEROBIC 5CC  Final   Culture   Final    NO GROWTH 2 DAYS Performed at [facility]    Report Status PENDING  Incomplete  MRSA PCR Screening     Status: None   Collection Time: 10/12/14  3:07 AM  Result Value Ref Range Status   MRSA by PCR NEGATIVE NEGATIVE Final    Comment:        The GeneXpert MRSA Assay (FDA approved for NASAL specimens only), is one component of a comprehensive MRSA colonization surveillance program. It is not intended to diagnose MRSA infection nor to guide or monitor treatment for MRSA infections.      Studies: Dg Chest 2 View  10/11/2014   CLINICAL DATA:  Patient currently undergoing chemotherapy for metastatic left upper lobe lung cancer, most recent chemotherapy 1 week ago, seen at the [facility] yesterday and informed bat he has leukopenia, presenting with acute onset fever and chills 104 degrees F.  EXAM: CHEST  2 VIEW  COMPARISON:  PET-CT 09/28/2014. CT chest  09/07/2014 dating back to 06/29/2012. Two-view chest x-ray 07/31/2011.  FINDINGS: Prior sternotomy for CABG. Cardiac silhouette moderately enlarged, unchanged. Left subclavian pacing defibrillator unchanged and appears intact.  Large left upper lobe lung mass extending into the left hilum, with improvement in aeration of the left upper lobe since the PET-CT. Diffuse interstitial pulmonary fibrosis, unchanged. No new pulmonary parenchymal abnormalities elsewhere in either lung. Degenerative changes and DISH involving the thoracic spine.  IMPRESSION: 1. Large left upper lobe lung mass with improved aeration in the left upper lobe since the PET-CT 09/28/2014, likely a combination of improved post-obstructive atelectasis and tumor shrinkage after chemotherapy. 2. Chronic Interstitial pulmonary fibrosis. 3. No new/acute abnormalities. 4. Stable cardiomegaly without evidence pulmonary edema.   Electronically Signed   By: Evangeline Dakin M.D.   On: 10/11/2014 18:53    Scheduled Meds:  Scheduled Meds: . amoxicillin-clavulanate  1 tablet Oral BID  . benzonatate  100 mg Oral TID  . carvedilol  6.25 mg Oral BID WC  . chlorpheniramine-HYDROcodone  5 mL Oral Q12H  . dabigatran  150 mg Oral BID  . dofetilide  250 mcg Oral BID  . doxycycline  100 mg Oral Q12H  . insulin aspart  0-9 Units Subcutaneous TID WC  . insulin detemir  20 Units Subcutaneous Daily  . omega-3 acid ethyl esters  1 g Oral Daily  . potassium chloride  40 mEq Oral Q4H  . simvastatin  40 mg Oral Daily  . sodium chloride  3 mL Intravenous Q12H  . vitamin B-12  2,500 mcg Oral Daily   Continuous Infusions:    Time spent on care of this patient: 35 min   [place], MD 10/13/2014, 10:55 AM  LOS: 2 days   [facility] Office  [phone] Pager - Text Page per [email or web address] If 7PM-7AM, please contact night-coverage [email or web address]

## 2014-10-13 NOTE — Plan of Care (Signed)
Problem: Phase I Progression Outcomes Goal: OOB as tolerated unless otherwise ordered Outcome: Progressing Patient ambulates back and forth to the BR.

## 2014-10-13 NOTE — Progress Notes (Signed)
Patient ambulated in the hallway with walker.

## 2014-10-14 DIAGNOSIS — E1151 Type 2 diabetes mellitus with diabetic peripheral angiopathy without gangrene: Secondary | ICD-10-CM

## 2014-10-14 LAB — BASIC METABOLIC PANEL
ANION GAP: 6 (ref 5–15)
BUN: 8 mg/dL (ref 6–20)
CHLORIDE: 108 mmol/L (ref 101–111)
CO2: 23 mmol/L (ref 22–32)
Calcium: 8.5 mg/dL — ABNORMAL LOW (ref 8.9–10.3)
Creatinine, Ser: 0.82 mg/dL (ref 0.61–1.24)
GFR calc non Af Amer: >60 mL/min (ref >60)
GLUCOSE: 109 mg/dL — AB (ref 65–99)
Potassium: 4.2 mmol/L (ref 3.5–5.1)
Sodium: 137 mmol/L (ref 135–145)

## 2014-10-14 LAB — GLUCOSE, CAPILLARY: Glucose-Capillary: 96 mg/dL (ref 65–99)

## 2014-10-14 LAB — CBC WITH DIFFERENTIAL/PLATELET
BASOS ABS: 0 10*3/uL (ref 0.0–0.1)
Basophils Relative: 0 % (ref 0–1)
EOS PCT: 4 % (ref 0–5)
Eosinophils Absolute: 0.2 10*3/uL (ref 0.0–0.7)
HEMATOCRIT: 31.5 % — AB (ref 39.0–52.0)
HEMOGLOBIN: 10.5 g/dL — AB (ref 13.0–17.0)
LYMPHS ABS: 1.8 10*3/uL (ref 0.7–4.0)
LYMPHS PCT: 33 % (ref 12–46)
MCH: 30.7 pg (ref 26.0–34.0)
MCHC: 33.3 g/dL (ref 30.0–36.0)
MCV: 92.1 fL (ref 78.0–100.0)
MONOS PCT: 10 % (ref 3–12)
Monocytes Absolute: 0.5 10*3/uL (ref 0.1–1.0)
NEUTROS ABS: 2.9 10*3/uL (ref 1.7–7.7)
Neutrophils Relative %: 53 % (ref 43–77)
Platelets: 73 10*3/uL — ABNORMAL LOW (ref 150–400)
RBC: 3.42 MIL/uL — AB (ref 4.22–5.81)
RDW: 13.2 % (ref 11.5–15.5)
WBC Morphology: INCREASED
WBC: 5.4 10*3/uL (ref 4.0–10.5)

## 2014-10-14 MED ORDER — BENZONATATE 200 MG PO CAPS: 200.0000 mg | ORAL_CAPSULE | Freq: Three times a day (TID) | ORAL | Status: DC | PRN

## 2014-10-14 MED ORDER — HYDROCOD POLST-CPM POLST ER 10-8 MG/5ML PO SUER
5.0000 mL | Freq: Two times a day (BID) | ORAL | Status: DC
Start: 2014-10-14 — End: 2014-11-08

## 2014-10-14 MED ORDER — DOXYCYCLINE HYCLATE 100 MG PO TABS: 100.0000 mg | ORAL_TABLET | Freq: Two times a day (BID) | ORAL | Status: DC

## 2014-10-14 MED ORDER — AMOXICILLIN-POT CLAVULANATE 875-125 MG PO TABS: 1.0000 | ORAL_TABLET | Freq: Two times a day (BID) | ORAL | Status: DC

## 2014-10-14 NOTE — Discharge Summary (Signed)
Physician Discharge Summary  Karl [name] GLO:[id number] DOB: 09/02/1935 DOA: 10/11/2014  PCP: [name], MD  Admit date: 10/11/2014 Discharge date: 10/14/2014  Time spent: 50 minutes  Recommendations for Outpatient Follow-up:  1. Follow BP and resume losartan if needed  Discharge Condition: Stable Diet recommendation: Low sodium heart healthy  Discharge Diagnoses:  Principal Problem:   Neutropenic fever/sepsis Active Problems:   Type 2 diabetes mellitus with vascular disease   HLD (hyperlipidemia)   CAD (coronary artery disease)   Atrial fibrillation   Chronic systolic heart failure   History of left carotid endarteectomy   Automatic implantable cardioverter-defibrillator in situ   Long-term (current) use of anticoagulants   Rheumatoid lung disease   Small cell lung carcinoma   Pancytopenia   Essential hypertension   Dehydration   Malnutrition of moderate degree   History of present illness:  Karl [name] is a 79 y.o. male with recently diagnosed small cell lung cancer and chemotherapy, systolic heart failure, CAD status post stent and CABG, A. fib on Pradaxa, rheumatoid arthritis, diabetes mellitus, hypertension who presents to the hospital with a fever. Temperature 102.7 degrees at home. The patient states fever started about 1 week ago on the day that he received Neulasta. He has a cough with brown mucus which has been going on for about 2 weeks. No shortness of breath. No dysuria. No nausea vomiting diarrhea or rash.  Hospital Course:  Principal Problem:  Neutropenia with fever/sepsis - No clear source as of yet other than possibly a bronchitis-he's had a dry cough since the diagnosis of his cancer but this has been productive for about 2 weeks now - this has resolved possibly from antibiotic treatment - CXR negative for infiltrate-no crackles on exam-no dyspnea or hypoxia  - cultures negative - transitioned vanc and Zosyn to Doxy and Augmentin is today-no  recurrence of fevers-will complete a 7 day course  Active Problems:  Acute Bronchitis - see above - Tessalon Perles and Tussionex ordered  Pancytopenia -Received first chemotherapy last week  -Leukocytes Improving-received Neulasta last week Friday- no longer leukopenic-WBC count 5.4 -Continue to follow cell counts as outpatient which are improving- no need for transfusions  Small cell lung carcinoma -Diagnosed in August of this year -On chemotherapy per Dr. Julien Nordmann   Type 2 diabetes mellitus with vascular disease -Continue Levemir and novolog   CAD (coronary artery disease) - CABG/stent   Atrial fibrillation- paroxysmal -Currently in sinus rhythm -Continue Tikosyn, Pradaxa  Hypokalemia -Replacing to keep level > 4   Chronic systolic heart failure - compensated -DC'd IV fluids -not on diuretics at home  Hypertension - Continue Coreg-losartan on held BP is low   History of left carotid endarteectomy   Automatic implantable cardioverter-defibrillator in situ   Rheumatoid lung disease -Follows with Dr. Elsworth Soho for pulmonary fibrosis- no issues with hypoxia  Discharge Exam: Filed Weights   10/12/14 0310 10/13/14 0452 10/14/14 0433  Weight: 90.7 kg (199 lb 15.3 oz) 95.5 kg (210 lb 8.6 oz) 93.6 kg (206 lb 5.6 oz)   Filed Vitals:   10/14/14 0433  BP: 118/55  Pulse: 68  Temp: 98 F (36.7 C)  Resp: 18    General: AAO x 3, no distress Cardiovascular: RRR, no murmurs  Respiratory: clear to auscultation bilaterally GI: soft, non-tender, non-distended, bowel sound positive  Discharge Instructions You were cared for by a hospitalist during your hospital stay. If you have any questions about your discharge medications or the care you received while you were  in the hospital after you are discharged, you can call the unit and asked to speak with the hospitalist on call if the hospitalist that took care of you is not available. Once you are discharged, your primary  care physician will handle any further medical issues. Please note that NO REFILLS for any discharge medications will be authorized once you are discharged, as it is imperative that you return to your primary care physician (or establish a relationship with a primary care physician if you do not have one) for your aftercare needs so that they can reassess your need for medications and monitor your lab values.      Discharge Instructions    Call MD for:  temperature >100.4    Complete by:  As directed      Discharge instructions    Complete by:  As directed   Ask PCP when to resume Losartan- BP low at this time. Diabetic heart healthy low sodium diet     Increase activity slowly    Complete by:  As directed             Medication List    STOP taking these medications        cephALEXin 500 MG capsule  Commonly known as:  KEFLEX     losartan 100 MG tablet  Commonly known as:  COZAAR      TAKE these medications        amoxicillin-clavulanate 875-125 MG per tablet  Commonly known as:  AUGMENTIN  Take 1 tablet by mouth 2 (two) times daily.     benzonatate 200 MG capsule  Commonly known as:  TESSALON  Take 1 capsule (200 mg total) by mouth 3 (three) times daily as needed for cough.     carvedilol 3.125 MG tablet  Commonly known as:  COREG  Take 6.25 mg by mouth 2 (two) times daily with a meal.     chlorpheniramine-HYDROcodone 10-8 MG/5ML Suer  Commonly known as:  TUSSIONEX  Take 5 mLs by mouth every 12 (twelve) hours.     dofetilide 250 MCG capsule  Commonly known as:  TIKOSYN  Take 250 mcg by mouth 2 (two) times daily.     doxycycline 100 MG tablet  Commonly known as:  VIBRA-TABS  Take 1 tablet (100 mg total) by mouth every 12 (twelve) hours.     Fish Oil 1000 MG Caps  Take 1,000 mg by mouth 2 (two) times daily.     HUMALOG KWIKPEN 100 UNIT/ML KiwkPen  Generic drug:  insulin lispro  Inject 4 Units into the skin 3 (three) times daily after meals.     LEVEMIR 100  UNIT/ML injection  Generic drug:  insulin detemir  Inject 28 Units into the skin daily.     LORazepam 0.5 MG tablet  Commonly known as:  ATIVAN  Take 1 tablet (0.5 mg total) by mouth every 6 (six) hours as needed (for nausea).     nitroGLYCERIN 0.4 MG SL tablet  Commonly known as:  NITROSTAT  Place 0.4 mg under the tongue every 5 (five) minutes as needed. For chest pain     NOVOFINE 32G X 6 MM Misc  Generic drug:  Insulin Pen Needle  Inject 1 each into the skin as directed.     ondansetron 4 MG tablet  Commonly known as:  ZOFRAN  Take 1 tablet (4 mg total) by mouth every 8 (eight) hours as needed for nausea or vomiting.     PRADAXA 150 MG  Caps capsule  Generic drug:  dabigatran  TAKE 1 CAPSULE BY MOUTH TWICE DAILY     simvastatin 40 MG tablet  Commonly known as:  ZOCOR  Take 40 mg by mouth daily.     Vitamin B-12 2500 MCG Subl  Place 1 tablet under the tongue daily.       Allergies  Allergen Reactions  . Amiodarone Other (See Comments)    Tremor   . Rosiglitazone-Metformin     Unknown      The results of significant diagnostics from this hospitalization (including imaging, microbiology, ancillary and laboratory) are listed below for reference.    Significant Diagnostic Studies: Dg Chest 2 View  10/11/2014   CLINICAL DATA:  Patient currently undergoing chemotherapy for metastatic left upper lobe lung cancer, most recent chemotherapy 1 week ago, seen at the [facility] yesterday and informed bat he has leukopenia, presenting with acute onset fever and chills 104 degrees F.  EXAM: CHEST  2 VIEW  COMPARISON:  PET-CT 09/28/2014. CT chest 09/07/2014 dating back to 06/29/2012. Two-view chest x-ray 07/31/2011.  FINDINGS: Prior sternotomy for CABG. Cardiac silhouette moderately enlarged, unchanged. Left subclavian pacing defibrillator unchanged and appears intact.  Large left upper lobe lung mass extending into the left hilum, with improvement in aeration of the left upper  lobe since the PET-CT. Diffuse interstitial pulmonary fibrosis, unchanged. No new pulmonary parenchymal abnormalities elsewhere in either lung. Degenerative changes and DISH involving the thoracic spine.  IMPRESSION: 1. Large left upper lobe lung mass with improved aeration in the left upper lobe since the PET-CT 09/28/2014, likely a combination of improved post-obstructive atelectasis and tumor shrinkage after chemotherapy. 2. Chronic Interstitial pulmonary fibrosis. 3. No new/acute abnormalities. 4. Stable cardiomegaly without evidence pulmonary edema.   Electronically Signed   By: Evangeline Dakin M.D.   On: 10/11/2014 18:53   Ct Head W Wo Contrast  09/25/2014   CLINICAL DATA:  Staging small cell lung cancer. History of CVA. ICD/pacer.  EXAM: CT HEAD WITHOUT AND WITH CONTRAST  TECHNIQUE: Contiguous axial images were obtained from the base of the skull through the vertex without and with intravenous contrast  CONTRAST:  151m OMNIPAQUE IOHEXOL 300 MG/ML  SOLN  COMPARISON:  06/21/2013  FINDINGS: Skull and Sinuses:No evidence of bony metastasis.  Orbits: Bilateral cataract resection. No evidence of orbital metastasis.  Brain: There are 2 punctate calcifications in the left cerebral hemisphere seen on image 23 and image 20, stable. No evidence of brain metastasis.  Remote right PCA branch infarct affecting the right occipital lobe.  No evidence of acute infarction, hemorrhage, hydrocephalus, or mass lesion/mass effect.  IMPRESSION: 1. No evidence of intracranial metastasis. 2. Remote right occipital infarct.   Electronically Signed   By: JMonte FantasiaM.D.   On: 09/25/2014 07:47   Nm Pet Image Initial (pi) Skull Base To Thigh  09/28/2014   CLINICAL DATA:  Initial treatment strategy for left lung cancer.  EXAM: NUCLEAR MEDICINE PET SKULL BASE TO THIGH  TECHNIQUE: 10.0 mCi F-18 FDG was injected intravenously. Full-ring PET imaging was performed from the skull base to thigh after the radiotracer. CT data was  obtained and used for attenuation correction and anatomic localization.  FASTING BLOOD GLUCOSE:  Value: 127 mg/dl  COMPARISON:  Multiple exams, including 09/07/2014  FINDINGS: NECK  Left supraclavicular node measures 1.4 cm in short axis on image 47 series 4 and has maximum standard uptake value 25.2.  CHEST  Pleural-based left upper lobe mass with suspected chest wall invasion  anteriorly between the first and second ribs but currently no overt rib destruction, extending down along the right mediastinal margin, maximum standard uptake value 27.9. This mass measures 10.3 by 11.1 cm on image 75 series 4. Pathologic prevascular, AP window, bilateral hilar, and subcarinal adenopathy, hypermetabolic. An index upper prevascular lymph node measures 2.8 cm in short axis on image 61 series 4 and has maximum standard uptake value of 16.7. Subcarinal lymph node measures 2.0 cm in short axis on image 81 series 4 and has maximum standard uptake value 9.6.  Adjacent to the invasive portion of the mass along the chest wall, a subpectoral lymph node measuring 1.1 cm in short axis has maximum standard uptake value 10.7.  There is low-level activity in the trace left pleural effusion, and malignant effusion is a concern.  Mild cardiomegaly. Pacer noted. Coronary, aortic arch, and branch vessel atherosclerotic vascular disease.  ABDOMEN/PELVIS  Faintly increased left adrenal activity noted, maximum standard uptake value 4.0, but without a well-defined mass observed.  Physiologic activity in bowel. Prominent prostate gland but without hypermetabolic activity in the prostate.  SKELETON  Thoracolumbar spondylosis. No bony metastatic disease is identified.  IMPRESSION: 1. Very large pleural based left upper lobe mass with invasion of the chest wall, extensive mediastinal adenopathy, bilateral hilar adenopathy, and supraclavicular and subpectoral lymph nodes on the left. Pleural fluid with faintly increased activity concerning for pleural  metastatic disease. Lobulation along the right mediastinal border questionable for invasion. Assuming non-small cell lung carcinoma, appearance compatible with at least T3 N3 M0 disease (stage IIIB). 2. Mild cardiomegaly.  Atherosclerosis.   Electronically Signed   By: Van Clines M.D.   On: 09/28/2014 09:53    Microbiology: Recent Results (from the past 240 hour(s))  Urine culture     Status: None   Collection Time: 10/11/14  6:49 PM  Result Value Ref Range Status   Specimen Description URINE, CLEAN CATCH  Final   Special Requests NONE  Final   Culture   Final    NO GROWTH 1 DAY Performed at [facility]    Report Status 10/12/2014 FINAL  Final  Culture, blood (routine x 2)     Status: None (Preliminary result)   Collection Time: 10/11/14  7:08 PM  Result Value Ref Range Status   Specimen Description BLOOD BLOOD LEFT FOREARM  Final   Special Requests BOTTLES DRAWN AEROBIC AND ANAEROBIC 5CC  Final   Culture   Final    NO GROWTH 1 DAY Performed at [facility]    Report Status PENDING  Incomplete  Culture, blood (routine x 2)     Status: None (Preliminary result)   Collection Time: 10/11/14  7:25 PM  Result Value Ref Range Status   Specimen Description BLOOD RIGHT FOREARM  Final   Special Requests BOTTLES DRAWN AEROBIC AND ANAEROBIC 5CC  Final   Culture   Final    NO GROWTH 2 DAYS Performed at [facility]    Report Status PENDING  Incomplete  MRSA PCR Screening     Status: None   Collection Time: 10/12/14  3:07 AM  Result Value Ref Range Status   MRSA by PCR NEGATIVE NEGATIVE Final    Comment:        The GeneXpert MRSA Assay (FDA approved for NASAL specimens only), is one component of a comprehensive MRSA colonization surveillance program. It is not intended to diagnose MRSA infection nor to guide or monitor treatment for MRSA infections.  Labs: Basic Metabolic Panel:  Recent Labs Lab 10/10/14 1058 10/11/14 1900 10/12/14 0353  10/13/14 0508 10/14/14 0504  NA 140 136 136 139 137  K 4.2 3.6 3.4* 3.5 4.2  CL  --  106 107 110 108  CO2 26 19* '22 23 23  '$ GLUCOSE 209* 183* 111* 74 109*  BUN 18.'5 17 17 12 8  '$ CREATININE 1.0 1.01 1.11 0.89 0.82  CALCIUM 9.4 8.6* 8.2* 8.5* 8.5*  MG 2.0  --   --   --   --    Liver Function Tests:  Recent Labs Lab 10/10/14 1058 10/11/14 1900 10/12/14 0353  AST 26 57* 41  ALT 43 70* 61  ALKPHOS 100 109 97  BILITOT 0.72 0.7 0.8  PROT 7.0 7.0 6.3*  ALBUMIN 3.1* 3.3* 2.9*   No results for input(s): LIPASE, AMYLASE in the last 168 hours. No results for input(s): AMMONIA in the last 168 hours. CBC:  Recent Labs Lab 10/11/14 1900 10/12/14 0353 10/12/14 1345 10/13/14 0508 10/13/14 0846 10/14/14 0504  WBC 1.1* 2.1* 3.0* 2.8* 3.1* 5.4  NEUTROABS 0.1*  --  0.8* 1.0* 1.5* 2.9  HGB 11.1* 10.1* 10.5* 10.3* 10.6* 10.5*  HCT 32.2* 30.2* 31.0* 30.8* 31.7* 31.5*  MCV 90.7 91.0 91.2 91.4 91.1 92.1  PLT 68* 54* 50* 52* 50* 73*   Cardiac Enzymes: No results for input(s): CKTOTAL, CKMB, CKMBINDEX, TROPONINI in the last 168 hours. BNP: BNP (last 3 results)  Recent Labs  10/12/14 0353  BNP 412.0*    ProBNP (last 3 results) No results for input(s): PROBNP in the last 8760 hours.  CBG:  Recent Labs Lab 10/13/14 0725 10/13/14 1146 10/13/14 1735 10/13/14 2131 10/14/14 0722  GLUCAP 75 171* 154* 144* 96       Signed:  Debbe Odea, MD [facility] 10/14/2014, 12:55 PM

## 2014-10-14 NOTE — Progress Notes (Signed)
Patient discharged home, all discharge medications and instructions reviewed and questions answered. Patient to be assisted to vehicle by wheelchair.  

## 2014-10-16 LAB — CULTURE, BLOOD (ROUTINE X 2): Culture: NO GROWTH

## 2014-10-17 ENCOUNTER — Other Ambulatory Visit (HOSPITAL_BASED_OUTPATIENT_CLINIC_OR_DEPARTMENT_OTHER): Payer: Medicare Other

## 2014-10-17 DIAGNOSIS — C3492 Malignant neoplasm of unspecified part of left bronchus or lung: Secondary | ICD-10-CM

## 2014-10-17 LAB — COMPREHENSIVE METABOLIC PANEL (CC13)
ALBUMIN: 3.1 g/dL — AB (ref 3.5–5.0)
ALK PHOS: 128 U/L (ref 40–150)
ALT: 35 U/L (ref 0–55)
AST: 20 U/L (ref 5–34)
Anion Gap: 8 mEq/L (ref 3–11)
BILIRUBIN TOTAL: 0.31 mg/dL (ref 0.20–1.20)
BUN: 9.1 mg/dL (ref 7.0–26.0)
CO2: 26 mEq/L (ref 22–29)
CREATININE: 0.9 mg/dL (ref 0.7–1.3)
Calcium: 9.5 mg/dL (ref 8.4–10.4)
Chloride: 108 mEq/L (ref 98–109)
EGFR: 80 mL/min/{1.73_m2} — AB (ref >90)
GLUCOSE: 145 mg/dL — AB (ref 70–140)
Potassium: 4.3 mEq/L (ref 3.5–5.1)
SODIUM: 142 meq/L (ref 136–145)
TOTAL PROTEIN: 7.1 g/dL (ref 6.4–8.3)

## 2014-10-17 LAB — CBC WITH DIFFERENTIAL/PLATELET
BASO%: 0.5 % (ref 0.0–2.0)
BASOS ABS: 0 10*3/uL (ref 0.0–0.1)
EOS%: 0.9 % (ref 0.0–7.0)
Eosinophils Absolute: 0.1 10*3/uL (ref 0.0–0.5)
HEMATOCRIT: 35.1 % — AB (ref 38.4–49.9)
HEMOGLOBIN: 11.7 g/dL — AB (ref 13.0–17.1)
LYMPH#: 2.1 10*3/uL (ref 0.9–3.3)
LYMPH%: 20.4 % (ref 14.0–49.0)
MCH: 30.4 pg (ref 27.2–33.4)
MCHC: 33.5 g/dL (ref 32.0–36.0)
MCV: 90.8 fL (ref 79.3–98.0)
MONO#: 1 10*3/uL — ABNORMAL HIGH (ref 0.1–0.9)
MONO%: 9.5 % (ref 0.0–14.0)
NEUT%: 68.7 % (ref 39.0–75.0)
NEUTROS ABS: 7 10*3/uL — AB (ref 1.5–6.5)
Platelets: 203 10*3/uL (ref 140–400)
RBC: 3.86 10*6/uL — ABNORMAL LOW (ref 4.20–5.82)
RDW: 13.1 % (ref 11.0–14.6)
WBC: 10.2 10*3/uL (ref 4.0–10.3)

## 2014-10-17 LAB — CULTURE, BLOOD (ROUTINE X 2): CULTURE: NO GROWTH

## 2014-10-17 LAB — MAGNESIUM (CC13): Magnesium: 1.9 mg/dl (ref 1.5–2.5)

## 2014-10-22 ENCOUNTER — Other Ambulatory Visit (HOSPITAL_BASED_OUTPATIENT_CLINIC_OR_DEPARTMENT_OTHER): Payer: Medicare Other

## 2014-10-22 ENCOUNTER — Other Ambulatory Visit: Payer: Self-pay | Admitting: Internal Medicine

## 2014-10-22 ENCOUNTER — Ambulatory Visit (HOSPITAL_BASED_OUTPATIENT_CLINIC_OR_DEPARTMENT_OTHER): Payer: Medicare Other

## 2014-10-22 VITALS — BP 144/58 | HR 70 | Temp 98.0°F | Resp 16

## 2014-10-22 DIAGNOSIS — Z5111 Encounter for antineoplastic chemotherapy: Secondary | ICD-10-CM

## 2014-10-22 DIAGNOSIS — C3492 Malignant neoplasm of unspecified part of left bronchus or lung: Secondary | ICD-10-CM

## 2014-10-22 DIAGNOSIS — C3412 Malignant neoplasm of upper lobe, left bronchus or lung: Secondary | ICD-10-CM

## 2014-10-22 LAB — COMPREHENSIVE METABOLIC PANEL (CC13)
ALT: 25 U/L (ref 0–55)
AST: 18 U/L (ref 5–34)
Albumin: 3.3 g/dL — ABNORMAL LOW (ref 3.5–5.0)
Alkaline Phosphatase: 117 U/L (ref 40–150)
Anion Gap: 6 mEq/L (ref 3–11)
BILIRUBIN TOTAL: 0.24 mg/dL (ref 0.20–1.20)
BUN: 10 mg/dL (ref 7.0–26.0)
CO2: 26 meq/L (ref 22–29)
Calcium: 9.4 mg/dL (ref 8.4–10.4)
Chloride: 108 mEq/L (ref 98–109)
Creatinine: 0.9 mg/dL (ref 0.7–1.3)
EGFR: 80 mL/min/{1.73_m2} — AB (ref >90)
GLUCOSE: 225 mg/dL — AB (ref 70–140)
Potassium: 4.3 mEq/L (ref 3.5–5.1)
SODIUM: 140 meq/L (ref 136–145)
TOTAL PROTEIN: 7.5 g/dL (ref 6.4–8.3)

## 2014-10-22 LAB — CBC WITH DIFFERENTIAL/PLATELET
BASO%: 1.3 % (ref 0.0–2.0)
Basophils Absolute: 0.1 10*3/uL (ref 0.0–0.1)
EOS ABS: 0 10*3/uL (ref 0.0–0.5)
EOS%: 0.4 % (ref 0.0–7.0)
HCT: 35.1 % — ABNORMAL LOW (ref 38.4–49.9)
HGB: 11.9 g/dL — ABNORMAL LOW (ref 13.0–17.1)
LYMPH%: 21.3 % (ref 14.0–49.0)
MCH: 30.8 pg (ref 27.2–33.4)
MCHC: 33.9 g/dL (ref 32.0–36.0)
MCV: 90.7 fL (ref 79.3–98.0)
MONO#: 0.9 10*3/uL (ref 0.1–0.9)
MONO%: 8 % (ref 0.0–14.0)
NEUT%: 69 % (ref 39.0–75.0)
NEUTROS ABS: 8.1 10*3/uL — AB (ref 1.5–6.5)
Platelets: 521 10*3/uL — ABNORMAL HIGH (ref 140–400)
RBC: 3.87 10*6/uL — AB (ref 4.20–5.82)
RDW: 13.2 % (ref 11.0–14.6)
WBC: 11.7 10*3/uL — ABNORMAL HIGH (ref 4.0–10.3)
lymph#: 2.5 10*3/uL (ref 0.9–3.3)

## 2014-10-22 LAB — MAGNESIUM (CC13): Magnesium: 2.1 mg/dl (ref 1.5–2.5)

## 2014-10-22 MED ORDER — ETOPOSIDE CHEMO INJECTION 1 GM/50ML
120.0000 mg/m2 | Freq: Once | INTRAVENOUS | Status: AC
  Administered 2014-10-22: 260 mg via INTRAVENOUS
  Filled 2014-10-22: qty 13

## 2014-10-22 MED ORDER — CARBOPLATIN CHEMO INJECTION 600 MG/60ML
530.0000 mg | Freq: Once | INTRAVENOUS | Status: AC
  Administered 2014-10-22: 530 mg via INTRAVENOUS
  Filled 2014-10-22: qty 53

## 2014-10-22 MED ORDER — SODIUM CHLORIDE 0.9 % IV SOLN
Freq: Once | INTRAVENOUS | Status: AC
  Administered 2014-10-22: 14:00:00 via INTRAVENOUS
  Filled 2014-10-22: qty 5

## 2014-10-22 MED ORDER — HOT PACK MISC ONCOLOGY
1.0000 | Freq: Once | Status: DC | PRN
  Filled 2014-10-22: qty 1

## 2014-10-22 MED ORDER — SODIUM CHLORIDE 0.9 % IV SOLN
Freq: Once | INTRAVENOUS | Status: AC
  Administered 2014-10-22: 13:00:00 via INTRAVENOUS

## 2014-10-22 NOTE — Patient Instructions (Signed)
[facility] Discharge Instructions for Patients Receiving Chemotherapy  Today you received the following chemotherapy agents: Carboplatin and Etoposide.  To help prevent nausea and vomiting after your treatment, we encourage you to take your nausea medication: Zofran 4 mg every 8 hours as needed.   If you develop nausea and vomiting that is not controlled by your nausea medication, call the clinic.   BELOW ARE SYMPTOMS THAT SHOULD BE REPORTED IMMEDIATELY:  *FEVER GREATER THAN 100.5 F  *CHILLS WITH OR WITHOUT FEVER  NAUSEA AND VOMITING THAT IS NOT CONTROLLED WITH YOUR NAUSEA MEDICATION  *UNUSUAL SHORTNESS OF BREATH  *UNUSUAL BRUISING OR BLEEDING  TENDERNESS IN MOUTH AND THROAT WITH OR WITHOUT PRESENCE OF ULCERS  *URINARY PROBLEMS  *BOWEL PROBLEMS  UNUSUAL RASH Items with * indicate a potential emergency and should be followed up as soon as possible.  Feel free to call the clinic you have any questions or concerns. The clinic phone number is [phone].  Please show the [facility] at check-in to the Emergency Department and triage nurse.

## 2014-10-22 NOTE — Addendum Note (Signed)
Addended by: Neysa Hotter on: 10/22/2014 01:48 PM   Modules accepted: Medications

## 2014-10-23 ENCOUNTER — Ambulatory Visit (HOSPITAL_BASED_OUTPATIENT_CLINIC_OR_DEPARTMENT_OTHER): Payer: Medicare Other | Admitting: Internal Medicine

## 2014-10-23 ENCOUNTER — Ambulatory Visit (HOSPITAL_BASED_OUTPATIENT_CLINIC_OR_DEPARTMENT_OTHER): Payer: Medicare Other

## 2014-10-23 ENCOUNTER — Ambulatory Visit: Payer: Medicare Other

## 2014-10-23 ENCOUNTER — Encounter: Payer: Self-pay | Admitting: Internal Medicine

## 2014-10-23 VITALS — BP 138/56 | HR 87 | Temp 97.7°F | Resp 17 | Ht 71.0 in | Wt 199.8 lb

## 2014-10-23 DIAGNOSIS — Z5111 Encounter for antineoplastic chemotherapy: Secondary | ICD-10-CM | POA: Diagnosis not present

## 2014-10-23 DIAGNOSIS — C3412 Malignant neoplasm of upper lobe, left bronchus or lung: Secondary | ICD-10-CM | POA: Diagnosis not present

## 2014-10-23 DIAGNOSIS — C3492 Malignant neoplasm of unspecified part of left bronchus or lung: Secondary | ICD-10-CM

## 2014-10-23 DIAGNOSIS — D701 Agranulocytosis secondary to cancer chemotherapy: Secondary | ICD-10-CM | POA: Diagnosis not present

## 2014-10-23 DIAGNOSIS — C3490 Malignant neoplasm of unspecified part of unspecified bronchus or lung: Secondary | ICD-10-CM

## 2014-10-23 MED ORDER — ALTEPLASE 2 MG IJ SOLR
2.0000 mg | Freq: Once | INTRAMUSCULAR | Status: DC | PRN
  Filled 2014-10-23: qty 2

## 2014-10-23 MED ORDER — LORAZEPAM 2 MG/ML IJ SOLN: 0.5000 mg | INTRAMUSCULAR | Status: DC | PRN

## 2014-10-23 MED ORDER — SODIUM CHLORIDE 0.9 % IJ SOLN
10.0000 mL | INTRAMUSCULAR | Status: DC | PRN
  Filled 2014-10-23: qty 10

## 2014-10-23 MED ORDER — HEPARIN SOD (PORK) LOCK FLUSH 100 UNIT/ML IV SOLN
500.0000 [IU] | Freq: Once | INTRAVENOUS | Status: DC | PRN
  Filled 2014-10-23: qty 5

## 2014-10-23 MED ORDER — SODIUM CHLORIDE 0.9 % IV SOLN
Freq: Once | INTRAVENOUS | Status: AC
  Administered 2014-10-23: 13:00:00 via INTRAVENOUS
  Filled 2014-10-23: qty 1

## 2014-10-23 MED ORDER — SODIUM CHLORIDE 0.9 % IJ SOLN
3.0000 mL | INTRAMUSCULAR | Status: DC | PRN
  Filled 2014-10-23: qty 10

## 2014-10-23 MED ORDER — SODIUM CHLORIDE 0.9 % IV SOLN
120.0000 mg/m2 | Freq: Once | INTRAVENOUS | Status: AC
  Administered 2014-10-23: 260 mg via INTRAVENOUS
  Filled 2014-10-23: qty 13

## 2014-10-23 MED ORDER — SODIUM CHLORIDE 0.9 % IV SOLN
Freq: Once | INTRAVENOUS | Status: AC
  Administered 2014-10-23: 13:00:00 via INTRAVENOUS

## 2014-10-23 MED ORDER — HEPARIN SOD (PORK) LOCK FLUSH 100 UNIT/ML IV SOLN
250.0000 [IU] | Freq: Once | INTRAVENOUS | Status: DC | PRN
  Filled 2014-10-23: qty 5

## 2014-10-23 NOTE — Progress Notes (Signed)
[place] [phone]   [phone]  OFFICE PROGRESS NOTE  [name], MD [place] [address]  DIAGNOSIS: Limited stage IIIB (T3, N3, M0) small cell lung cancer diagnosed in August 2016.  PRIOR THERAPY: None  CURRENT THERAPY: Systemic chemotherapy was carboplatin for AUC of 5 on day 1 and etoposide 120 MG/M2 on days 1, 2 and 3 with Neulasta support on day 4. Status post one cycle.  INTERVAL HISTORY: Karl [name] 79 y.o. male returns to the clinic today for follow-up visit accompanied by his wife and grandson. The patient related the first cycle of his treatment fairly well except for severe neutropenia and he was admitted to [facility] for management of questionable neutropenic fever. He recovered very well. The patient denied having any significant chest pain, shortness breath, cough or hemoptysis. He continues to have mild fatigue. He denied having any significant weight loss or night sweats. No current fever or chills, no nausea or vomiting. He is here for evaluation with cycle #2.  MEDICAL HISTORY: Past Medical History  Diagnosis Date  . Coronary artery disease     status post CABG by Dr. Redmond Pulling in 1993 and status post percutaneous transluminal coronary angioplasty and stenting by Dr. Wynonia Lawman in 2002 and 2006  . Carotid artery occlusion     severe left internal carotid artery stenosis, asymptomatic status post carotid endarterectomy  . Hypertensive heart disease without CHF   . Carotid artery disease   . Hyperlipidemia   . ICD (implantable cardiac defibrillator) in place   . CHF (congestive heart failure)   . Myocardial infarction ~ 1992  . Anginal pain   . Pacemaker   . 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 H/O hiatal hernia     "gone after my bypass"  . Arthritis     "neck, back, knees"  . Ischemic cardiomyopathy   . Atrial fibrillation   . Rheumatoid arthritis(714.0)   .  AICD (automatic cardioverter/defibrillator) present     ALLERGIES:  is allergic to amiodarone and rosiglitazone-metformin.  MEDICATIONS:  Current Outpatient Prescriptions  Medication Sig Dispense Refill  . carvedilol (COREG) 3.125 MG tablet Take 6.25 mg by mouth 2 (two) times daily with a meal.     . Cyanocobalamin (VITAMIN B-12) 2500 MCG SUBL Place 1 tablet under the tongue daily.      Marland Kitchen dofetilide (TIKOSYN) 250 MCG capsule Take 250 mcg by mouth 2 (two) times daily.    Marland Kitchen HUMALOG KWIKPEN 100 UNIT/ML KiwkPen Inject 4 Units into the skin 3 (three) times daily after meals.   6  . insulin detemir (LEVEMIR) 100 UNIT/ML injection Inject 28 Units into the skin daily.     Marland Kitchen NOVOFINE 32G X 6 MM MISC Inject 1 each into the skin as directed.   12  . Omega-3 Fatty Acids (FISH OIL) 1000 MG CAPS Take 1,000 mg by mouth 2 (two) times daily.     Marland Kitchen PRADAXA 150 MG CAPS capsule TAKE 1 CAPSULE BY MOUTH TWICE DAILY (Patient taking differently: TAKE 150 MG  BY MOUTH TWICE DAILY) 60 capsule 5  . simvastatin (ZOCOR) 40 MG tablet Take 40 mg by mouth daily.     . benzonatate (TESSALON) 200 MG capsule Take 1 capsule (200 mg total) by mouth 3 (three) times daily as needed for cough. (Patient not taking: Reported on 10/23/2014) 30 capsule 0  . chlorpheniramine-HYDROcodone (TUSSIONEX) 10-8 MG/5ML SUER Take 5 mLs by  mouth every 12 (twelve) hours. (Patient not taking: Reported on 10/23/2014) 140 mL 0  . LORazepam (ATIVAN) 0.5 MG tablet Take 1 tablet (0.5 mg total) by mouth every 6 (six) hours as needed (for nausea). (Patient not taking: Reported on 10/11/2014) 30 tablet 0  . nitroGLYCERIN (NITROSTAT) 0.4 MG SL tablet Place 0.4 mg under the tongue every 5 (five) minutes as needed. For chest pain    . ondansetron (ZOFRAN) 4 MG tablet Take 1 tablet (4 mg total) by mouth every 8 (eight) hours as needed for nausea or vomiting. (Patient not taking: Reported on 10/11/2014) 30 tablet 0   No current facility-administered medications for  this visit.    SURGICAL HISTORY:  Past Surgical History  Procedure Laterality Date  . Carotid endarterectomy  2009    left  . Cardiac defibrillator placement  05/2006    [employer]   . Eye surgery  March 2013    Cataract Left eye  . Eye surgery  06/08/2011    Cataract Right eye  . Pr vein bypass graft,aorto-fem-pop    . Inguinal hernia repair  1970's    bilaterally  . Hemorrhoid surgery  1970's  . Coronary angioplasty with stent placement      "I've got 2" (08/10/11)  . Cardioversion  08/2010  . Insert / replace / remove pacemaker  05/2006    "got a defibrillator/pacemaker" (08/10/11)  . Coronary artery bypass graft  08/1991    CABG X5  . Cardioversion  08/13/2011    Procedure: CARDIOVERSION;  Surgeon: Jacolyn Reedy, MD;  Location: [facility] [place];  Service: Cardiovascular;  Laterality: N/A;  . Left heart catheterization with coronary/graft angiogram N/A 06/16/2012    Procedure: LEFT HEART CATHETERIZATION WITH Beatrix Fetters;  Surgeon: Jacolyn Reedy, MD;  Location: [facility] CATH LAB;  Service: Cardiovascular;  Laterality: N/A;  . Video bronchoscopy Bilateral 09/11/2014    Procedure: VIDEO BRONCHOSCOPY WITH FLUORO;  Surgeon: Juanito Doom, MD;  Location: [facility];  Service: Cardiopulmonary;  Laterality: Bilateral;    REVIEW OF SYSTEMS:  A comprehensive review of systems was negative except for: Constitutional: positive for fatigue   PHYSICAL EXAMINATION: General appearance: alert, cooperative, fatigued and no distress Head: Normocephalic, without obvious abnormality, atraumatic Neck: no adenopathy, no JVD, supple, symmetrical, trachea midline and thyroid not enlarged, symmetric, no tenderness/mass/nodules Lymph nodes: Cervical, supraclavicular, and axillary nodes normal. Resp: clear to auscultation bilaterally Back: symmetric, no curvature. ROM normal. No CVA tenderness. Cardio: regular rate and rhythm, S1, S2 normal, no murmur, click, rub or gallop GI: soft, non-tender; bowel sounds  normal; no masses,  no organomegaly Extremities: extremities normal, atraumatic, no cyanosis or edema  ECOG PERFORMANCE STATUS: 1 - Symptomatic but completely ambulatory  Blood pressure 138/56, pulse 87, temperature 97.7 F (36.5 C), temperature source Oral, resp. rate 17, height '5\' 11"'$  (1.803 m), weight 199 lb 12.8 oz (90.629 kg), SpO2 95 %.  LABORATORY DATA: Lab Results  Component Value Date   WBC 11.7* 10/22/2014   HGB 11.9* 10/22/2014   HCT 35.1* 10/22/2014   MCV 90.7 10/22/2014   PLT 521* 10/22/2014      Chemistry      Component Value Date/Time   NA 140 10/22/2014 1150   NA 137 10/14/2014 0504   K 4.3 10/22/2014 1150   K 4.2 10/14/2014 0504   CL 108 10/14/2014 0504   CO2 26 10/22/2014 1150   CO2 23 10/14/2014 0504   BUN 10.0 10/22/2014 1150   BUN 8 10/14/2014 0504  CREATININE 0.9 10/22/2014 1150   CREATININE 0.82 10/14/2014 0504      Component Value Date/Time   CALCIUM 9.4 10/22/2014 1150   CALCIUM 8.5* 10/14/2014 0504   ALKPHOS 117 10/22/2014 1150   ALKPHOS 97 10/12/2014 0353   AST 18 10/22/2014 1150   AST 41 10/12/2014 0353   ALT 25 10/22/2014 1150   ALT 61 10/12/2014 0353   BILITOT 0.24 10/22/2014 1150   BILITOT 0.8 10/12/2014 0353       RADIOGRAPHIC STUDIES: Dg Chest 2 View  10/11/2014   CLINICAL DATA:  Patient currently undergoing chemotherapy for metastatic left upper lobe lung cancer, most recent chemotherapy 1 week ago, [facility] yesterday and informed bat he has leukopenia, presenting with acute onset fever and chills 104 degrees F.  EXAM: CHEST  2 VIEW  COMPARISON:  PET-CT 09/28/2014. CT chest 09/07/2014 dating back to 06/29/2012. Two-view chest x-ray 07/31/2011.  FINDINGS: Prior sternotomy for CABG. Cardiac silhouette moderately enlarged, unchanged. Left subclavian pacing defibrillator unchanged and appears intact.  Large left upper lobe lung mass extending into the left hilum, with improvement in aeration of the left upper lobe since  the PET-CT. Diffuse interstitial pulmonary fibrosis, unchanged. No new pulmonary parenchymal abnormalities elsewhere in either lung. Degenerative changes and DISH involving the thoracic spine.  IMPRESSION: 1. Large left upper lobe lung mass with improved aeration in the left upper lobe since the PET-CT 09/28/2014, likely a combination of improved post-obstructive atelectasis and tumor shrinkage after chemotherapy. 2. Chronic Interstitial pulmonary fibrosis. 3. No new/acute abnormalities. 4. Stable cardiomegaly without evidence pulmonary edema.   Electronically Signed   By: Evangeline Dakin M.D.   On: 10/11/2014 18:53   Ct Head W Wo Contrast  09/25/2014   CLINICAL DATA:  Staging small cell lung cancer. History of CVA. ICD/pacer.  EXAM: CT HEAD WITHOUT AND WITH CONTRAST  TECHNIQUE: Contiguous axial images were obtained from the base of the skull through the vertex without and with intravenous contrast  CONTRAST:  173m OMNIPAQUE IOHEXOL 300 MG/ML  SOLN  COMPARISON:  06/21/2013  FINDINGS: Skull and Sinuses:No evidence of bony metastasis.  Orbits: Bilateral cataract resection. No evidence of orbital metastasis.  Brain: There are 2 punctate calcifications in the left cerebral hemisphere seen on image 23 and image 20, stable. No evidence of brain metastasis.  Remote right PCA branch infarct affecting the right occipital lobe.  No evidence of acute infarction, hemorrhage, hydrocephalus, or mass lesion/mass effect.  IMPRESSION: 1. No evidence of intracranial metastasis. 2. Remote right occipital infarct.   Electronically Signed   By: JMonte FantasiaM.D.   On: 09/25/2014 07:47   Nm Pet Image Initial (pi) Skull Base To Thigh  09/28/2014   CLINICAL DATA:  Initial treatment strategy for left lung cancer.  EXAM: NUCLEAR MEDICINE PET SKULL BASE TO THIGH  TECHNIQUE: 10.0 mCi F-18 FDG was injected intravenously. Full-ring PET imaging was performed from the skull base to thigh after the radiotracer. CT data was obtained and  used for attenuation correction and anatomic localization.  FASTING BLOOD GLUCOSE:  Value: 127 mg/dl  COMPARISON:  Multiple exams, including 09/07/2014  FINDINGS: NECK  Left supraclavicular node measures 1.4 cm in short axis on image 47 series 4 and has maximum standard uptake value 25.2.  CHEST  Pleural-based left upper lobe mass with suspected chest wall invasion anteriorly between the first and second ribs but currently no overt rib destruction, extending down along the right mediastinal margin, maximum standard uptake value 27.9. This  mass measures 10.3 by 11.1 cm on image 75 series 4. Pathologic prevascular, AP window, bilateral hilar, and subcarinal adenopathy, hypermetabolic. An index upper prevascular lymph node measures 2.8 cm in short axis on image 61 series 4 and has maximum standard uptake value of 16.7. Subcarinal lymph node measures 2.0 cm in short axis on image 81 series 4 and has maximum standard uptake value 9.6.  Adjacent to the invasive portion of the mass along the chest wall, a subpectoral lymph node measuring 1.1 cm in short axis has maximum standard uptake value 10.7.  There is low-level activity in the trace left pleural effusion, and malignant effusion is a concern.  Mild cardiomegaly. Pacer noted. Coronary, aortic arch, and branch vessel atherosclerotic vascular disease.  ABDOMEN/PELVIS  Faintly increased left adrenal activity noted, maximum standard uptake value 4.0, but without a well-defined mass observed.  Physiologic activity in bowel. Prominent prostate gland but without hypermetabolic activity in the prostate.  SKELETON  Thoracolumbar spondylosis. No bony metastatic disease is identified.  IMPRESSION: 1. Very large pleural based left upper lobe mass with invasion of the chest wall, extensive mediastinal adenopathy, bilateral hilar adenopathy, and supraclavicular and subpectoral lymph nodes on the left. Pleural fluid with faintly increased activity concerning for pleural metastatic  disease. Lobulation along the right mediastinal border questionable for invasion. Assuming non-small cell lung carcinoma, appearance compatible with at least T3 N3 M0 disease (stage IIIB). 2. Mild cardiomegaly.  Atherosclerosis.   Electronically Signed   By: Van Clines M.D.   On: 09/28/2014 09:53    ASSESSMENT AND PLAN: This is a very pleasant 79 years old white male with limited stage small cell lung cancer currently undergoing systemic chemotherapy with carboplatin and etoposide status post 1 cycle. The patient is tolerating his treatment fairly well except for neutropenic fever after cycle #1 and he recovered quickly. I recommended for the patient to proceed with cycle #2 as a scheduled. He will come back for follow-up visit in 3 weeks for reevaluation after repeating CT scan of the chest, abdomen and pelvis for restaging of his disease. I will also make sure that the patient has an appointment with radiation oncology for consideration of concurrent radiotherapy. The patient was advised to call immediately if he has any concerning symptoms in the interval. The patient voices understanding of current disease status and treatment options and is in agreement with the current care plan.  All questions were answered. The patient knows to call the clinic with any problems, questions or concerns. We can certainly see the patient much sooner if necessary.  Disclaimer: This note was dictated with voice recognition software. Similar sounding words can inadvertently be transcribed and may not be corrected upon review.

## 2014-10-24 ENCOUNTER — Ambulatory Visit (HOSPITAL_BASED_OUTPATIENT_CLINIC_OR_DEPARTMENT_OTHER): Payer: Medicare Other

## 2014-10-24 VITALS — BP 151/52 | HR 64 | Temp 97.6°F | Resp 20

## 2014-10-24 DIAGNOSIS — Z5111 Encounter for antineoplastic chemotherapy: Secondary | ICD-10-CM

## 2014-10-24 DIAGNOSIS — C3412 Malignant neoplasm of upper lobe, left bronchus or lung: Secondary | ICD-10-CM | POA: Diagnosis not present

## 2014-10-24 DIAGNOSIS — C3492 Malignant neoplasm of unspecified part of left bronchus or lung: Secondary | ICD-10-CM

## 2014-10-24 MED ORDER — SODIUM CHLORIDE 0.9 % IV SOLN
Freq: Once | INTRAVENOUS | Status: AC
  Administered 2014-10-24: 14:00:00 via INTRAVENOUS

## 2014-10-24 MED ORDER — SODIUM CHLORIDE 0.9 % IV SOLN
Freq: Once | INTRAVENOUS | Status: AC
  Administered 2014-10-24: 14:00:00 via INTRAVENOUS
  Filled 2014-10-24: qty 1

## 2014-10-24 MED ORDER — SODIUM CHLORIDE 0.9 % IV SOLN
120.0000 mg/m2 | Freq: Once | INTRAVENOUS | Status: AC
  Administered 2014-10-24: 260 mg via INTRAVENOUS
  Filled 2014-10-24: qty 13

## 2014-10-24 MED ORDER — LORAZEPAM 2 MG/ML IJ SOLN: 0.5000 mg | INTRAMUSCULAR | Status: AC | PRN

## 2014-10-24 NOTE — Patient Instructions (Signed)
[facility] Discharge Instructions for Patients Receiving Chemotherapy  Today you received the following chemotherapy agents: Etoposide   To help prevent nausea and vomiting after your treatment, we encourage you to take your nausea medication as directed.    If you develop nausea and vomiting that is not controlled by your nausea medication, call the clinic.   BELOW ARE SYMPTOMS THAT SHOULD BE REPORTED IMMEDIATELY:  *FEVER GREATER THAN 100.5 F  *CHILLS WITH OR WITHOUT FEVER  NAUSEA AND VOMITING THAT IS NOT CONTROLLED WITH YOUR NAUSEA MEDICATION  *UNUSUAL SHORTNESS OF BREATH  *UNUSUAL BRUISING OR BLEEDING  TENDERNESS IN MOUTH AND THROAT WITH OR WITHOUT PRESENCE OF ULCERS  *URINARY PROBLEMS  *BOWEL PROBLEMS  UNUSUAL RASH Items with * indicate a potential emergency and should be followed up as soon as possible.  Feel free to call the clinic you have any questions or concerns. The clinic phone number is [phone].  Please show the CHEMO ALERT CARD at check-in to the Emergency Department and triage nurse.   

## 2014-10-26 ENCOUNTER — Ambulatory Visit (HOSPITAL_BASED_OUTPATIENT_CLINIC_OR_DEPARTMENT_OTHER): Payer: Medicare Other

## 2014-10-26 VITALS — BP 131/48 | HR 53 | Temp 97.7°F

## 2014-10-26 DIAGNOSIS — C3492 Malignant neoplasm of unspecified part of left bronchus or lung: Secondary | ICD-10-CM

## 2014-10-26 DIAGNOSIS — C3412 Malignant neoplasm of upper lobe, left bronchus or lung: Secondary | ICD-10-CM

## 2014-10-26 DIAGNOSIS — Z5189 Encounter for other specified aftercare: Secondary | ICD-10-CM | POA: Diagnosis not present

## 2014-10-26 MED ORDER — PEGFILGRASTIM INJECTION 6 MG/0.6ML ~~LOC~~
6.0000 mg | PREFILLED_SYRINGE | Freq: Once | SUBCUTANEOUS | Status: AC
  Administered 2014-10-26: 6 mg via SUBCUTANEOUS
  Filled 2014-10-26: qty 0.6

## 2014-10-29 ENCOUNTER — Encounter: Payer: Self-pay | Admitting: Radiation Oncology

## 2014-10-29 NOTE — Progress Notes (Addendum)
Thoracic Location of Tumor / Histology: Small cell lung cancer  Limited stage IIIB(T3,N3,MO)  Patient presented  months ago with symptoms of: Hemoptysis, sob  Biopsies of  (if applicable) revealed: Diagnosis 09/11/14:Diagnosis Lung, needle/core biopsy(ies), Left upper lobe- SMALL CELL CARCINOMA.  BRONCHIAL WASHING LEFT UPPER LOBE (SPECIMEN 1 OF 2, COLLECTED ON 09/11/2014):ATYPICAL CELLS SUSPICIOUS FOR SMALL CELL CARCINOMA  Tobacco/Marijuana/Snuff/ETOH use:  Cigarettes 1ppd x 26 years, Quit 3//1982, former chewing tobacco use, quit 04/02/2013, alcohol no, stated quit alcohol 2 years ago, drank 1-2 beers occasionally  stopped when dx with lung cancer stated, no drug use   Past/Anticipated interventions by cardiothoracic surgery, if any: Dr. Lemmie Evens, Video Bronchoscopy  With washings and bx 09/11/14  Past/Anticipated interventions by medical oncology, if any: Dr. Julien Nordmann, 10/23/14 Note=CURRENT THERAPY: Systemic chemotherapy was carboplatin for AUC of 5 on day 1 and etoposide 120 MG/M2 on days 1, 2 and 3 with Neulasta support on day 4. Status post one cycle.had severe neutropenia,and admitted to [facility], 10/23/14 Cycle#2 carboplatin/etoposide and neulasta support , F/U 11/12/14 reval after CT  Scan for restaging disease   Signs/Symptoms  Weight changes, if any: 15 lb weight loss past few months, appetite has improved from 25-75 % now, drinks glucerna occasionally  Respiratory complaints, if any:  no  Hemoptysis, if any: no  Pain issues, if any:  No  SAFETY ISSUES:  Prior radiation? NO  Pacemaker/ICD? Yes, Pacemaker/defibrillator  Dr. Cristopher Peru, to see MD in October may need new pacemaker/defibrillator  Is the patient on methotrexate? No  Current Complaints / other details: Married, 2 children, , on disability,   HX A-fib, Ischemic cardiomyopathy,MI, CAD, CABG 1993,ans Stenting 2002,&2006, Type II DM, s/p carotid endarterectomy, scalp cancer removed  2013 Father DM,,Heart disease,before age 1, MI;,died age 88, Mother CVA, age 84,sister with bladder cancer dx 3 years ago, living  Allergies: amiodarone= tremors,and bradycardia; , rosiglittazone metformin=unknown BP 134/50 mmHg  Pulse 71  Temp(Src) 97.8 F (36.6 C) (Oral)  Resp 20  Ht '5\' 11"'$  (1.803 m)  Wt 197 lb (89.359 kg)  BMI 27.49 kg/m2  SpO2 99%  Wt Readings from Last 3 Encounters:  10/31/14 197 lb (89.359 kg)  10/23/14 199 lb 12.8 oz (90.629 kg)  10/14/14 206 lb 5.6 oz (93.6 kg)

## 2014-10-31 ENCOUNTER — Encounter: Payer: Self-pay | Admitting: Radiation Oncology

## 2014-10-31 ENCOUNTER — Other Ambulatory Visit (HOSPITAL_BASED_OUTPATIENT_CLINIC_OR_DEPARTMENT_OTHER): Payer: Medicare Other

## 2014-10-31 ENCOUNTER — Ambulatory Visit
Admission: RE | Admit: 2014-10-31 | Discharge: 2014-10-31 | Disposition: A | Discharge status: Home / Self Care | Payer: Medicare Other | Source: Ambulatory Visit | Attending: Radiation Oncology | Admitting: Radiation Oncology

## 2014-10-31 ENCOUNTER — Other Ambulatory Visit: Payer: Medicare Other

## 2014-10-31 ENCOUNTER — Other Ambulatory Visit: Payer: Self-pay | Admitting: *Deleted

## 2014-10-31 VITALS — BP 134/50 | HR 71 | Temp 97.8°F | Resp 20 | Ht 71.0 in | Wt 197.0 lb

## 2014-10-31 DIAGNOSIS — I2510 Atherosclerotic heart disease of native coronary artery without angina pectoris: Secondary | ICD-10-CM | POA: Diagnosis not present

## 2014-10-31 DIAGNOSIS — I4891 Unspecified atrial fibrillation: Secondary | ICD-10-CM | POA: Insufficient documentation

## 2014-10-31 DIAGNOSIS — E119 Type 2 diabetes mellitus without complications: Secondary | ICD-10-CM | POA: Diagnosis not present

## 2014-10-31 DIAGNOSIS — C3412 Malignant neoplasm of upper lobe, left bronchus or lung: Secondary | ICD-10-CM | POA: Diagnosis present

## 2014-10-31 DIAGNOSIS — E785 Hyperlipidemia, unspecified: Secondary | ICD-10-CM | POA: Diagnosis not present

## 2014-10-31 DIAGNOSIS — Z95810 Presence of automatic (implantable) cardiac defibrillator: Secondary | ICD-10-CM | POA: Diagnosis not present

## 2014-10-31 DIAGNOSIS — Z950 Presence of cardiac pacemaker: Secondary | ICD-10-CM | POA: Insufficient documentation

## 2014-10-31 DIAGNOSIS — C3492 Malignant neoplasm of unspecified part of left bronchus or lung: Secondary | ICD-10-CM | POA: Diagnosis not present

## 2014-10-31 DIAGNOSIS — C3402 Malignant neoplasm of left main bronchus: Secondary | ICD-10-CM | POA: Insufficient documentation

## 2014-10-31 DIAGNOSIS — I119 Hypertensive heart disease without heart failure: Secondary | ICD-10-CM | POA: Insufficient documentation

## 2014-10-31 DIAGNOSIS — Z510 Encounter for antineoplastic radiation therapy: Secondary | ICD-10-CM | POA: Diagnosis present

## 2014-10-31 DIAGNOSIS — Z85828 Personal history of other malignant neoplasm of skin: Secondary | ICD-10-CM | POA: Diagnosis not present

## 2014-10-31 DIAGNOSIS — C3410 Malignant neoplasm of upper lobe, unspecified bronchus or lung: Secondary | ICD-10-CM

## 2014-10-31 HISTORY — DX: Malignant neoplasm of unspecified part of unspecified bronchus or lung: C34.90

## 2014-10-31 HISTORY — DX: Allergy, unspecified, initial encounter: T78.40XA

## 2014-10-31 HISTORY — DX: Unspecified malignant neoplasm of skin, unspecified: C44.90

## 2014-10-31 LAB — COMPREHENSIVE METABOLIC PANEL (CC13)
ALT: 53 U/L (ref 0–55)
ANION GAP: 6 meq/L (ref 3–11)
AST: 29 U/L (ref 5–34)
Albumin: 3.2 g/dL — ABNORMAL LOW (ref 3.5–5.0)
Alkaline Phosphatase: 135 U/L (ref 40–150)
BUN: 13.7 mg/dL (ref 7.0–26.0)
CHLORIDE: 107 meq/L (ref 98–109)
CO2: 24 meq/L (ref 22–29)
CREATININE: 0.9 mg/dL (ref 0.7–1.3)
Calcium: 8.9 mg/dL (ref 8.4–10.4)
EGFR: 79 mL/min/{1.73_m2} — ABNORMAL LOW (ref >90)
Glucose: 255 mg/dl — ABNORMAL HIGH (ref 70–140)
Potassium: 4.2 mEq/L (ref 3.5–5.1)
Sodium: 138 mEq/L (ref 136–145)
Total Bilirubin: 0.69 mg/dL (ref 0.20–1.20)
Total Protein: 6.7 g/dL (ref 6.4–8.3)

## 2014-10-31 LAB — CBC WITH DIFFERENTIAL/PLATELET
BASO%: 1.3 % (ref 0.0–2.0)
Basophils Absolute: 0 10*3/uL (ref 0.0–0.1)
EOS%: 1.7 % (ref 0.0–7.0)
Eosinophils Absolute: 0.1 10*3/uL (ref 0.0–0.5)
HCT: 32.6 % — ABNORMAL LOW (ref 38.4–49.9)
HGB: 11 g/dL — ABNORMAL LOW (ref 13.0–17.1)
LYMPH%: 36.4 % (ref 14.0–49.0)
MCH: 30.6 pg (ref 27.2–33.4)
MCHC: 33.7 g/dL (ref 32.0–36.0)
MCV: 90.8 fL (ref 79.3–98.0)
MONO#: 0.5 10*3/uL (ref 0.1–0.9)
MONO%: 15.5 % — AB (ref 0.0–14.0)
NEUT%: 45.1 % (ref 39.0–75.0)
NEUTROS ABS: 1.3 10*3/uL — AB (ref 1.5–6.5)
Platelets: 181 10*3/uL (ref 140–400)
RBC: 3.59 10*6/uL — AB (ref 4.20–5.82)
RDW: 13.5 % (ref 11.0–14.6)
WBC: 3 10*3/uL — AB (ref 4.0–10.3)
lymph#: 1.1 10*3/uL (ref 0.9–3.3)

## 2014-10-31 LAB — MAGNESIUM (CC13): MAGNESIUM: 1.9 mg/dL (ref 1.5–2.5)

## 2014-10-31 NOTE — Progress Notes (Signed)
Please see the Nurse Progress Note in the MD Initial Consult Encounter for this patient. 

## 2014-10-31 NOTE — Progress Notes (Signed)
Radiation Oncology         [phone]) [phone] ________________________________  Name: Karl [name] MRN: [id number]  Date: 10/31/2014  DOB: 1935-04-19  SH:FWYOV,ZCHY M, MD  [name], MD     REFERRING PHYSICIAN: [name], MD   DIAGNOSIS: The primary encounter diagnosis was Primary cancer of left upper lobe of lung. A diagnosis of Malignant neoplasm of bronchus of left upper lobe was also pertinent to this visit.   HISTORY OF PRESENT ILLNESS::Karl [name] is a 79 y.o. male who is seen for an initial consultation visit regarding the patient's diagnosis of limited stage IIIB (T3, N3, M0) small cell lung cancer diagnosed in August 2016. The patient initially presented with hemoptysis and shortness of breath. The patient is currently receiving chemotherapy with Dr. Julien Nordmann. He has had troubles with infection. He still experiences shortness of breath, noting insignificant change from 1-2 years ago. He feels weaker than 1-2 years ago. He denies hemoptysis, chest pain, headaches, or any new pain. He experienced some nausea early in the week, just watching what he ate to alleviate this, without taking any anti nausea medication. Notes that he needs a knee replacement. The patient has a defibrillator in place and follows with Dr. Wynonia Lawman in cardiology. He plans to have a new battery installed sometime next month. The patient is uncomfortable about the idea of having the defibrillator removed, as it has saved his life twice.    PREVIOUS RADIATION THERAPY: No   PAST MEDICAL HISTORY:  has a past medical history of Coronary artery disease; Carotid artery occlusion; Hypertensive heart disease without CHF; Carotid artery disease; Hyperlipidemia; ICD (implantable cardiac defibrillator) in place; CHF (congestive heart failure); Anginal pain; Pacemaker; Type 2 diabetes mellitus with vascular disease (12/11/2008); H/O hiatal hernia; Arthritis; Ischemic cardiomyopathy; Atrial fibrillation; Rheumatoid  arthritis(714.0); AICD (automatic cardioverter/defibrillator) present; Skin cancer (2013); Allergy; Lung cancer (09/11/14); and Myocardial infarction (~ 1992).     PAST SURGICAL HISTORY: Past Surgical History  Procedure Laterality Date  . Carotid endarterectomy  2009    left  . Cardiac defibrillator placement  05/2006    St Jude   . Eye surgery  March 2013    Cataract Left eye  . Eye surgery  06/08/2011    Cataract Right eye  . Pr vein bypass graft,aorto-fem-pop    . Inguinal hernia repair  1970's    bilaterally  . Hemorrhoid surgery  1970's  . Coronary angioplasty with stent placement      "I've got 2" (08/10/11)  . Cardioversion  08/2010  . Insert / replace / remove pacemaker  05/2006    "got a defibrillator/pacemaker" (08/10/11)  . Coronary artery bypass graft  08/1991    CABG X5  . Cardioversion  08/13/2011    Procedure: CARDIOVERSION;  Surgeon: Jacolyn Reedy, MD;  Location: [place];  Service: Cardiovascular;  Laterality: N/A;  . Left heart catheterization with coronary/graft angiogram N/A 06/16/2012    Procedure: LEFT HEART CATHETERIZATION WITH Beatrix Fetters;  Surgeon: Jacolyn Reedy, MD;  Location: [facility] CATH LAB;  Service: Cardiovascular;  Laterality: N/A;  . Video bronchoscopy Bilateral 09/11/2014    Procedure: VIDEO BRONCHOSCOPY WITH FLUORO;  Surgeon: Juanito Doom, MD;  Location: [place];  Service: Cardiopulmonary;  Laterality: Bilateral;  . Cataract surgery Bilateral      FAMILY HISTORY: family history includes Diabetes in his father; Heart attack in his father; Heart disease in his father; Stroke in his mother.   SOCIAL HISTORY:  reports that he quit smoking  about 34 years ago. His smoking use included Cigarettes. He has a 26 pack-year smoking history. He quit smokeless tobacco use about 18 months ago. His smokeless tobacco use included Chew. He reports that he drinks about 1.2 oz of alcohol per week. He reports that he does not use illicit drugs.   ALLERGIES:  Amiodarone and Rosiglitazone-metformin   MEDICATIONS:  Current Outpatient Prescriptions  Medication Sig Dispense Refill  . carvedilol (COREG) 3.125 MG tablet Take 6.25 mg by mouth 2 (two) times daily with a meal.     . Cyanocobalamin (VITAMIN B-12) 2500 MCG SUBL Place 1 tablet under the tongue daily.      Marland Kitchen dofetilide (TIKOSYN) 250 MCG capsule Take 250 mcg by mouth 2 (two) times daily.    Marland Kitchen HUMALOG KWIKPEN 100 UNIT/ML KiwkPen Inject 4 Units into the skin 3 (three) times daily after meals.   6  . insulin detemir (LEVEMIR) 100 UNIT/ML injection Inject 28 Units into the skin daily.     . nitroGLYCERIN (NITROSTAT) 0.4 MG SL tablet Place 0.4 mg under the tongue every 5 (five) minutes as needed. For chest pain    . NOVOFINE 32G X 6 MM MISC Inject 1 each into the skin as directed.   12  . Omega-3 Fatty Acids (FISH OIL) 1000 MG CAPS Take 1,000 mg by mouth 2 (two) times daily.     Marland Kitchen PRADAXA 150 MG CAPS capsule TAKE 1 CAPSULE BY MOUTH TWICE DAILY (Patient taking differently: TAKE 150 MG  BY MOUTH TWICE DAILY) 60 capsule 5  . benzonatate (TESSALON) 200 MG capsule Take 1 capsule (200 mg total) by mouth 3 (three) times daily as needed for cough. (Patient not taking: Reported on 10/23/2014) 30 capsule 0  . chlorpheniramine-HYDROcodone (TUSSIONEX) 10-8 MG/5ML SUER Take 5 mLs by mouth every 12 (twelve) hours. (Patient not taking: Reported on 10/23/2014) 140 mL 0  . LORazepam (ATIVAN) 0.5 MG tablet Take 1 tablet (0.5 mg total) by mouth every 6 (six) hours as needed (for nausea). (Patient not taking: Reported on 10/11/2014) 30 tablet 0  . ondansetron (ZOFRAN) 4 MG tablet Take 1 tablet (4 mg total) by mouth every 8 (eight) hours as needed for nausea or vomiting. (Patient not taking: Reported on 10/11/2014) 30 tablet 0  . simvastatin (ZOCOR) 40 MG tablet Take 40 mg by mouth daily.      No current facility-administered medications for this encounter.   Facility-Administered Medications Ordered in Other Encounters    Medication Dose Route Frequency Provider Last Rate Last Dose  . LORazepam (ATIVAN) injection 0.5 mg  0.5 mg Intravenous PRN Curt Bears, MD         REVIEW OF SYSTEMS:  A 15 point review of systems is documented in the electronic medical record. This was obtained by the nursing staff. However, I reviewed this with the patient to discuss relevant findings and make appropriate changes.  Pertinent items are noted in HPI.    PHYSICAL EXAM:  height is '5\' 11"'$  (1.803 m) and weight is 197 lb (89.359 kg). His oral temperature is 97.8 F (36.6 C). His blood pressure is 134/50 and his pulse is 71. His respiration is 20 and oxygen saturation is 99%.   General: Well-developed, in no acute distress HEENT: Normocephalic, atraumatic; oral cavity clear Neck: Supple without any lymphadenopathy Cardiovascular: Regular rate and rhythm Respiratory: Decreased breath sounds in the left upper lobe, otherwise clear. GI: Soft, nontender, normal bowel sounds Extremities: No edema present Neuro: No focal deficits  ECOG = 1  0 - Asymptomatic (Fully active, able to carry on all predisease activities without restriction)  1 - Symptomatic but completely ambulatory (Restricted in physically strenuous activity but ambulatory and able to carry out work of a light or sedentary nature. For example, light housework, office work)  2 - Symptomatic, <50% in bed during the day (Ambulatory and capable of all self care but unable to carry out any work activities. Up and about more than 50% of waking hours)  3 - Symptomatic, >50% in bed, but not bedbound (Capable of only limited self-care, confined to bed or chair 50% or more of waking hours)  4 - Bedbound (Completely disabled. Cannot carry on any self-care. Totally confined to bed or chair)  5 - Death   Eustace Pen MM, Creech RH, Tormey DC, et al. [phone]). "Toxicity and response criteria of the [facility]". [place] Oncol. 5 (6):  649-55     LABORATORY DATA:  Lab Results  Component Value Date   WBC 3.0* 10/31/2014   HGB 11.0* 10/31/2014   HCT 32.6* 10/31/2014   MCV 90.8 10/31/2014   PLT 181 10/31/2014   Lab Results  Component Value Date   NA 138 10/31/2014   K 4.2 10/31/2014   CL 108 10/14/2014   CO2 24 10/31/2014   Lab Results  Component Value Date   ALT 53 10/31/2014   AST 29 10/31/2014   ALKPHOS 135 10/31/2014   BILITOT 0.69 10/31/2014      RADIOGRAPHY: Dg Chest 2 View  10/11/2014   CLINICAL DATA:  Patient currently undergoing chemotherapy for metastatic left upper lobe lung cancer, most recent chemotherapy 1 week ago, seen at the [facility] yesterday and informed bat he has leukopenia, presenting with acute onset fever and chills 104 degrees F.  EXAM: CHEST  2 VIEW  COMPARISON:  PET-CT 09/28/2014. CT chest 09/07/2014 dating back to 06/29/2012. Two-view chest x-ray 07/31/2011.  FINDINGS: Prior sternotomy for CABG. Cardiac silhouette moderately enlarged, unchanged. Left subclavian pacing defibrillator unchanged and appears intact.  Large left upper lobe lung mass extending into the left hilum, with improvement in aeration of the left upper lobe since the PET-CT. Diffuse interstitial pulmonary fibrosis, unchanged. No new pulmonary parenchymal abnormalities elsewhere in either lung. Degenerative changes and DISH involving the thoracic spine.  IMPRESSION: 1. Large left upper lobe lung mass with improved aeration in the left upper lobe since the PET-CT 09/28/2014, likely a combination of improved post-obstructive atelectasis and tumor shrinkage after chemotherapy. 2. Chronic Interstitial pulmonary fibrosis. 3. No new/acute abnormalities. 4. Stable cardiomegaly without evidence pulmonary edema.   Electronically Signed   By: Evangeline Dakin M.D.   On: 10/11/2014 18:53       IMPRESSION: Mr. Ore is a 79 yo gentleman with limited stage IIIB (T3, N3, M0) small cell lung cancer. The patient has extensive  locally advanced disease. The patient has a defibrillator in place. Patient is a good candidate for thoracic radiation treatment. Defibrillator is immediately adjacent to the treatment area which would make treatment planning extremely difficult. The patient says he has discussed replacement of the defibrillator due to battery life. This would be discussed further at his next cardiology visit. If pacemaker can be moved to the contralateral side, that would be ideal for our purposes.Without this, I am afraid that the entirity of his target area could not be fully treated.   PLAN: Mr. Noreen will continue to follow and receive chemotherapy in medial oncology with Dr. Julien Nordmann.  He follows with Dr. Wynonia Lawman in cardiology and has an appointment with him next month. The patient states he plans to have a new defibrillatorinstalled due to battery life.  I will call the patient's cardiologist to discuss radiation planning.      This document serves as a record of services personally performed by Kyung Rudd, MD. It was created on his behalf by Arlyce Harman, a trained medical scribe. The creation of this record is based on the scribe's personal observations and the provider's statements to them. This document has been checked and approved by the attending provider. ________________________________   Jodelle Gross, MD, PhD   **Disclaimer: This note was dictated with voice recognition software. Similar sounding words can inadvertently be transcribed and this note may contain transcription errors which may not have been corrected upon publication of note.**

## 2014-11-05 ENCOUNTER — Other Ambulatory Visit: Payer: Medicare Other

## 2014-11-08 ENCOUNTER — Encounter: Payer: Self-pay | Admitting: Internal Medicine

## 2014-11-08 ENCOUNTER — Ambulatory Visit (INDEPENDENT_AMBULATORY_CARE_PROVIDER_SITE_OTHER): Payer: Medicare Other | Admitting: Internal Medicine

## 2014-11-08 ENCOUNTER — Encounter: Payer: Self-pay | Admitting: *Deleted

## 2014-11-08 VITALS — BP 128/62 | HR 66 | Ht 71.0 in | Wt 197.8 lb

## 2014-11-08 DIAGNOSIS — I5022 Chronic systolic (congestive) heart failure: Secondary | ICD-10-CM

## 2014-11-08 DIAGNOSIS — I255 Ischemic cardiomyopathy: Secondary | ICD-10-CM

## 2014-11-08 DIAGNOSIS — R918 Other nonspecific abnormal finding of lung field: Secondary | ICD-10-CM

## 2014-11-08 DIAGNOSIS — Z95810 Presence of automatic (implantable) cardiac defibrillator: Secondary | ICD-10-CM | POA: Diagnosis not present

## 2014-11-08 LAB — CUP PACEART INCLINIC DEVICE CHECK
Battery Voltage: 2.53 V
Brady Statistic RV Percent Paced: 0.45 %
HIGH POWER IMPEDANCE MEASURED VALUE: 42 Ohm
Lead Channel Impedance Value: 437.5 Ohm
Lead Channel Pacing Threshold Amplitude: 0.75 V
Lead Channel Pacing Threshold Amplitude: 0.75 V
Lead Channel Pacing Threshold Pulse Width: 0.5 ms
MDC IDC MSMT BATTERY REMAINING LONGEVITY: 11.4 mo
MDC IDC MSMT LEADCHNL RV PACING THRESHOLD PULSEWIDTH: 0.5 ms
MDC IDC MSMT LEADCHNL RV SENSING INTR AMPL: 12 mV
MDC IDC SESS DTM: 20161006145209
MDC IDC SET LEADCHNL RV PACING AMPLITUDE: 2.5 V
MDC IDC SET LEADCHNL RV PACING PULSEWIDTH: 0.5 ms
MDC IDC SET LEADCHNL RV SENSING SENSITIVITY: 0.3 mV
Pulse Gen Serial Number: 446149
Zone Setting Detection Interval: 290 ms
Zone Setting Detection Interval: 360 ms

## 2014-11-08 NOTE — Assessment & Plan Note (Signed)
The patient has small cell lung CA and is pending XRT. His device is over the lung mass and needs moving/removal. We discussed the treatment options in detail. I have recommended we removed the device and cap the leads, moving them out of the way, possibly to the contralateral side.

## 2014-11-08 NOTE — Patient Instructions (Addendum)
Medication Instructions:   No change  Labwork:  AT THE [facility] THE WEEK OF THE 17TH  Testing/Procedures:  REMOVAL OF ICD   Follow-Up:  Your physician recommends that you schedule a follow-up appointment in: 10-14 DAYS FROM 11-26-14 FOR WOUND CHECK IN DEVICE CLINIC  AND 3 MONTHS WITH DR Lovena Le

## 2014-11-08 NOTE — Assessment & Plan Note (Signed)
His device is approaching ERI, but not there yet. Will plan to remove for XRT and decide on placing a new device after his treatment is over and pending the results of his treatment. I have offered him placement of a life vest. He has never received ICD therapy for VT (only rapid atrial fib) and I am not sure this would be needed.

## 2014-11-08 NOTE — Progress Notes (Signed)
HPI Mr. Thrun returns today for ICD followup. He is a very pleasant 79 year old man with a history of atrial fibrillation, and an ischemic cardiomyopathy, status post bypass surgery in the past. He has chronic systolic heart failure which is well compensated and class II. He has done well on dofetilide for his atrial fibrillation. He does not feel palpitations or any symptomatic atrial fib. He denies chest pain or shortness of breath or syncope. No peripheral edema. His activity appears to be limited if anything by arthritis, particularly with pain in his left knee. He has been diagnosed with lung CA and is undergoin chemo and is pending XRT. The patient's tumor sits just behind his ICD. He is not at [facility] but has less than a year on his battery likely about 6 months. He is here today to discuss treatment options.  Allergies  Allergen Reactions  . Amiodarone Other (See Comments)    Tremor   . Rosiglitazone-Metformin     Unknown     Current Outpatient Prescriptions  Medication Sig Dispense Refill  . carvedilol (COREG) 3.125 MG tablet Take 6.25 mg by mouth 2 (two) times daily with a meal.     . Cyanocobalamin (VITAMIN B-12) 2500 MCG SUBL Place 1 tablet under the tongue daily.      Marland Kitchen dofetilide (TIKOSYN) 250 MCG capsule Take 250 mcg by mouth 2 (two) times daily.    Marland Kitchen HUMALOG KWIKPEN 100 UNIT/ML KiwkPen Inject 4 Units into the skin 3 (three) times daily after meals.   6  . insulin detemir (LEVEMIR) 100 UNIT/ML injection Inject 28 Units into the skin daily.     . nitroGLYCERIN (NITROSTAT) 0.4 MG SL tablet Place 0.4 mg under the tongue every 5 (five) minutes as needed. For chest pain    . NOVOFINE 32G X 6 MM MISC Inject 1 each into the skin as directed.   12  . Omega-3 Fatty Acids (FISH OIL) 1000 MG CAPS Take 1,000 mg by mouth 2 (two) times daily.     Marland Kitchen PRADAXA 150 MG CAPS capsule TAKE 1 CAPSULE BY MOUTH TWICE DAILY (Patient taking differently: TAKE 150 MG  BY MOUTH TWICE DAILY) 60 capsule 5  .  simvastatin (ZOCOR) 40 MG tablet Take 40 mg by mouth daily.      No current facility-administered medications for this visit.   Facility-Administered Medications Ordered in Other Visits  Medication Dose Route Frequency Provider Last Rate Last Dose  . LORazepam (ATIVAN) injection 0.5 mg  0.5 mg Intravenous PRN Curt Bears, MD         Past Medical History  Diagnosis Date  . Coronary artery disease     status post CABG by Dr. Redmond Pulling in 1993 and status post percutaneous transluminal coronary angioplasty and stenting by Dr. Wynonia Lawman in 2002 and 2006  . Carotid artery occlusion     severe left internal carotid artery stenosis, asymptomatic status post carotid endarterectomy  . Hypertensive heart disease without CHF   . Carotid artery disease   . Hyperlipidemia   . ICD (implantable cardiac defibrillator) in place   . CHF (congestive heart failure) ([place])   . Anginal pain ([place])   . Pacemaker   . Type 2 diabetes mellitus with vascular disease ([place]) 12/11/2008  . H/O hiatal hernia     "gone after my bypass"  . Arthritis     "neck, back, knees"  . Ischemic cardiomyopathy   . Atrial fibrillation ([place])   . Rheumatoid arthritis(714.0)   . AICD (automatic cardioverter/defibrillator) present   .  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 Allergy   . Lung cancer ([place]) 09/11/14    eft upper lobe lung  . Myocardial infarction ([place]) ~ 1992    ROS:   All systems reviewed and negative except as noted in the HPI.   Past Surgical History  Procedure Laterality Date  . Carotid endarterectomy  2009    left  . Cardiac defibrillator placement  05/2006    St Jude   . Eye surgery  March 2013    Cataract Left eye  . Eye surgery  06/08/2011    Cataract Right eye  . Pr vein bypass graft,aorto-fem-pop    . Inguinal hernia repair  1970's    bilaterally  . Hemorrhoid surgery  1970's  . Coronary angioplasty with stent placement      "I've got 2" (08/10/11)  . Cardioversion  08/2010  . Insert / replace /  remove pacemaker  05/2006    "got a defibrillator/pacemaker" (08/10/11)  . Coronary artery bypass graft  08/1991    CABG X5  . Cardioversion  08/13/2011    Procedure: CARDIOVERSION;  Surgeon: Jacolyn Reedy, MD;  Location: [place];  Service: Cardiovascular;  Laterality: N/A;  . Left heart catheterization with coronary/graft angiogram N/A 06/16/2012    Procedure: LEFT HEART CATHETERIZATION WITH Beatrix Fetters;  Surgeon: Jacolyn Reedy, MD;  Location: [facility] CATH LAB;  Service: Cardiovascular;  Laterality: N/A;  . Video bronchoscopy Bilateral 09/11/2014    Procedure: VIDEO BRONCHOSCOPY WITH FLUORO;  Surgeon: Juanito Doom, MD;  Location: [place];  Service: Cardiopulmonary;  Laterality: Bilateral;  . Cataract surgery Bilateral      Family History  Problem Relation Age of Onset  . Diabetes Father   . Heart disease Father     Heart Disease before age 71  . Heart attack Father   . Stroke Mother      Social History   Social History  . Marital Status: Married    Spouse Name: N/A  . Number of Children: N/A  . Years of Education: N/A   Occupational History  . Not on file.   Social History Main Topics  . Smoking status: Former Smoker -- 1.00 packs/day for 26 years    Types: Cigarettes    Quit date: 10/20/1980  . Smokeless tobacco: Former Systems developer    Types: Chew    Quit date: 04/02/2013  . Alcohol Use: 1.2 oz/week    2 Cans of beer per week     Comment: per week stopped alcohol 2 years ago 1-2 beers occasuionally  . Drug Use: No  . Sexual Activity: Not Currently   Other Topics Concern  . Not on file   Social History Narrative     BP 128/62 mmHg  Pulse 66  Ht '5\' 11"'$  (1.803 m)  Wt 197 lb 12.8 oz (89.721 kg)  BMI 27.60 kg/m2  Physical Exam:  Well appearing 79 year old man, NAD HEENT: Unremarkable Neck:  7 cm JVD, no thyromegally Lungs:  Clear with no wheezes, rales, or rhonchi. HEART:  Regular rate rhythm, no murmurs, no rubs, no clicks Abd:  soft, positive  bowel sounds, no organomegally, no rebound, no guarding Ext:  2 plus pulses, no edema, no cyanosis, no clubbing Skin:  No rashes no nodules Neuro:  CN II through XII intact, motor grossly intact  DEVICE  Normal device function.  See PaceArt for details.   Assess/Plan:

## 2014-11-09 ENCOUNTER — Ambulatory Visit (HOSPITAL_COMMUNITY)
Admission: RE | Admit: 2014-11-09 | Discharge: 2014-11-09 | Disposition: A | Discharge status: Home / Self Care | Payer: Medicare Other | Source: Ambulatory Visit | Attending: Internal Medicine | Admitting: Internal Medicine

## 2014-11-09 ENCOUNTER — Encounter (HOSPITAL_COMMUNITY): Payer: Self-pay

## 2014-11-09 DIAGNOSIS — J849 Interstitial pulmonary disease, unspecified: Secondary | ICD-10-CM | POA: Insufficient documentation

## 2014-11-09 DIAGNOSIS — R590 Localized enlarged lymph nodes: Secondary | ICD-10-CM | POA: Insufficient documentation

## 2014-11-09 DIAGNOSIS — C3490 Malignant neoplasm of unspecified part of unspecified bronchus or lung: Secondary | ICD-10-CM | POA: Insufficient documentation

## 2014-11-09 MED ORDER — IOHEXOL 300 MG/ML  SOLN
75.0000 mL | Freq: Once | INTRAMUSCULAR | Status: AC | PRN
  Administered 2014-11-09: 75 mL via INTRAVENOUS

## 2014-11-12 ENCOUNTER — Encounter: Payer: Self-pay | Admitting: Oncology

## 2014-11-12 ENCOUNTER — Telehealth: Payer: Self-pay | Admitting: Internal Medicine

## 2014-11-12 ENCOUNTER — Other Ambulatory Visit (HOSPITAL_BASED_OUTPATIENT_CLINIC_OR_DEPARTMENT_OTHER): Payer: Medicare Other

## 2014-11-12 ENCOUNTER — Ambulatory Visit (HOSPITAL_BASED_OUTPATIENT_CLINIC_OR_DEPARTMENT_OTHER): Payer: Medicare Other | Admitting: Oncology

## 2014-11-12 ENCOUNTER — Ambulatory Visit (HOSPITAL_BASED_OUTPATIENT_CLINIC_OR_DEPARTMENT_OTHER): Payer: Medicare Other

## 2014-11-12 VITALS — BP 126/55 | HR 70 | Temp 97.9°F | Resp 18 | Ht 71.0 in | Wt 196.5 lb

## 2014-11-12 DIAGNOSIS — C3412 Malignant neoplasm of upper lobe, left bronchus or lung: Secondary | ICD-10-CM

## 2014-11-12 DIAGNOSIS — C3490 Malignant neoplasm of unspecified part of unspecified bronchus or lung: Secondary | ICD-10-CM

## 2014-11-12 DIAGNOSIS — Z5111 Encounter for antineoplastic chemotherapy: Secondary | ICD-10-CM | POA: Diagnosis not present

## 2014-11-12 DIAGNOSIS — C3492 Malignant neoplasm of unspecified part of left bronchus or lung: Secondary | ICD-10-CM

## 2014-11-12 LAB — CBC WITH DIFFERENTIAL/PLATELET
BASO%: 0.8 % (ref 0.0–2.0)
BASOS ABS: 0.1 10*3/uL (ref 0.0–0.1)
EOS ABS: 0.1 10*3/uL (ref 0.0–0.5)
EOS%: 1.1 % (ref 0.0–7.0)
HCT: 32.1 % — ABNORMAL LOW (ref 38.4–49.9)
HEMOGLOBIN: 10.8 g/dL — AB (ref 13.0–17.1)
LYMPH%: 17.9 % (ref 14.0–49.0)
MCH: 30.3 pg (ref 27.2–33.4)
MCHC: 33.6 g/dL (ref 32.0–36.0)
MCV: 90.2 fL (ref 79.3–98.0)
MONO#: 0.8 10*3/uL (ref 0.1–0.9)
MONO%: 8 % (ref 0.0–14.0)
NEUT#: 7.5 10*3/uL — ABNORMAL HIGH (ref 1.5–6.5)
NEUT%: 72.2 % (ref 39.0–75.0)
Platelets: 323 10*3/uL (ref 140–400)
RBC: 3.56 10*6/uL — ABNORMAL LOW (ref 4.20–5.82)
RDW: 15 % — ABNORMAL HIGH (ref 11.0–14.6)
WBC: 10.4 10*3/uL — ABNORMAL HIGH (ref 4.0–10.3)
lymph#: 1.9 10*3/uL (ref 0.9–3.3)

## 2014-11-12 LAB — COMPREHENSIVE METABOLIC PANEL (CC13)
ALBUMIN: 3.4 g/dL — AB (ref 3.5–5.0)
ALK PHOS: 117 U/L (ref 40–150)
ALT: 16 U/L (ref 0–55)
AST: 13 U/L (ref 5–34)
Anion Gap: 8 mEq/L (ref 3–11)
BUN: 12.9 mg/dL (ref 7.0–26.0)
CHLORIDE: 109 meq/L (ref 98–109)
CO2: 22 mEq/L (ref 22–29)
Calcium: 9.2 mg/dL (ref 8.4–10.4)
Creatinine: 1 mg/dL (ref 0.7–1.3)
EGFR: 76 mL/min/{1.73_m2} — AB (ref >90)
GLUCOSE: 325 mg/dL — AB (ref 70–140)
POTASSIUM: 4.3 meq/L (ref 3.5–5.1)
SODIUM: 139 meq/L (ref 136–145)
Total Bilirubin: 0.3 mg/dL (ref 0.20–1.20)
Total Protein: 7.1 g/dL (ref 6.4–8.3)

## 2014-11-12 LAB — MAGNESIUM (CC13): MAGNESIUM: 1.9 mg/dL (ref 1.5–2.5)

## 2014-11-12 MED ORDER — SODIUM CHLORIDE 0.9 % IV SOLN
Freq: Once | INTRAVENOUS | Status: AC
  Administered 2014-11-12: 12:00:00 via INTRAVENOUS
  Filled 2014-11-12: qty 5

## 2014-11-12 MED ORDER — SODIUM CHLORIDE 0.9 % IV SOLN
120.0000 mg/m2 | Freq: Once | INTRAVENOUS | Status: AC
  Administered 2014-11-12: 260 mg via INTRAVENOUS
  Filled 2014-11-12: qty 13

## 2014-11-12 MED ORDER — SODIUM CHLORIDE 0.9 % IV SOLN
Freq: Once | INTRAVENOUS | Status: AC
  Administered 2014-11-12: 12:00:00 via INTRAVENOUS

## 2014-11-12 MED ORDER — SODIUM CHLORIDE 0.9 % IV SOLN
533.0000 mg | Freq: Once | INTRAVENOUS | Status: AC
  Administered 2014-11-12: 530 mg via INTRAVENOUS
  Filled 2014-11-12: qty 53

## 2014-11-12 NOTE — Telephone Encounter (Signed)
Gave adn printed appt sched and avs for pt for OCT and NOV....10.24 lab cx due to getting defibrillator taken out.

## 2014-11-12 NOTE — Patient Instructions (Signed)
Etoposide, VP-16 injection  What is this medicine?  ETOPOSIDE, VP-16 (e toe POE side) is a chemotherapy drug. It is used to treat testicular cancer, lung cancer, and other cancers.  This medicine may be used for other purposes; ask your health care provider or pharmacist if you have questions.  What should I tell my health care provider before I take this medicine?  They need to know if you have any of these conditions:  -infection  -kidney disease  -low blood counts, like low white cell, platelet, or red cell counts  -an unusual or allergic reaction to etoposide, other chemotherapeutic agents, other medicines, foods, dyes, or preservatives  -pregnant or trying to get pregnant  -breast-feeding  How should I use this medicine?  This medicine is for infusion into a vein. It is administered in a hospital or clinic by a specially trained health care professional.  Talk to your pediatrician regarding the use of this medicine in children. Special care may be needed.  Overdosage: If you think you have taken too much of this medicine contact a poison control center or emergency room at once.  NOTE: This medicine is only for you. Do not share this medicine with others.  What if I miss a dose?  It is important not to miss your dose. Call your doctor or health care professional if you are unable to keep an appointment.  What may interact with this medicine?  -aspirin  -certain medications for seizures like carbamazepine, phenobarbital, phenytoin, valproic acid  -cyclosporine  -levamisole  -warfarin  This list may not describe all possible interactions. Give your health care provider a list of all the medicines, herbs, non-prescription drugs, or dietary supplements you use. Also tell them if you smoke, drink alcohol, or use illegal drugs. Some items may interact with your medicine.  What should I watch for while using this medicine?  Visit your doctor for checks on your progress. This drug may make you feel generally unwell.  This is not uncommon, as chemotherapy can affect healthy cells as well as cancer cells. Report any side effects. Continue your course of treatment even though you feel ill unless your doctor tells you to stop.  In some cases, you may be given additional medicines to help with side effects. Follow all directions for their use.  Call your doctor or health care professional for advice if you get a fever, chills or sore throat, or other symptoms of a cold or flu. Do not treat yourself. This drug decreases your body's ability to fight infections. Try to avoid being around people who are sick.  This medicine may increase your risk to bruise or bleed. Call your doctor or health care professional if you notice any unusual bleeding.  Be careful brushing and flossing your teeth or using a toothpick because you may get an infection or bleed more easily. If you have any dental work done, tell your dentist you are receiving this medicine.  Avoid taking products that contain aspirin, acetaminophen, ibuprofen, naproxen, or ketoprofen unless instructed by your doctor. These medicines may hide a fever.  Do not become pregnant while taking this medicine or for at least 6 months after stopping it. Women should inform their doctor if they wish to become pregnant or think they might be pregnant. Women of child-bearing potential will need to have a negative pregnancy test before starting this medicine. There is a potential for serious side effects to an unborn child. Talk to your health care professional or   pharmacist for more information. Do not breast-feed an infant while taking this medicine.  Men must use a latex condom during sexual contact with a woman while taking this medicine and for at least 4 months after stopping it. A latex condom is needed even if you have had a vasectomy. Contact your doctor right away if your partner becomes pregnant. Do not donate sperm while taking this medicine and for at least 4 months after you stop  taking this medicine. Men should inform their doctors if they wish to father a child. This medicine may lower sperm counts.  What side effects may I notice from receiving this medicine?  Side effects that you should report to your doctor or health care professional as soon as possible:  -allergic reactions like skin rash, itching or hives, swelling of the face, lips, or tongue  -low blood counts - this medicine may decrease the number of white blood cells, red blood cells and platelets. You may be at increased risk for infections and bleeding.  -signs of infection - fever or chills, cough, sore throat, pain or difficulty passing urine  -signs of decreased platelets or bleeding - bruising, pinpoint red spots on the skin, black, tarry stools, blood in the urine  -signs of decreased red blood cells - unusually weak or tired, fainting spells, lightheadedness  -breathing problems  -changes in vision  -mouth or throat sores or ulcers  -pain, redness, swelling or irritation at the injection site  -pain, tingling, numbness in the hands or feet  -redness, blistering, peeling or loosening of the skin, including inside the mouth  -seizures  -vomiting  Side effects that usually do not require medical attention (report to your doctor or health care professional if they continue or are bothersome):  -diarrhea  -hair loss  -loss of appetite  -nausea  -stomach pain  This list may not describe all possible side effects. Call your doctor for medical advice about side effects. You may report side effects to FDA at 1-800-FDA-1088.  Where should I keep my medicine?  This drug is given in a hospital or clinic and will not be stored at home.  NOTE: This sheet is a summary. It may not cover all possible information. If you have questions about this medicine, talk to your doctor, pharmacist, or health care provider.      2016, Elsevier/Gold Standard. (2013-09-14 12:32:50)  Carboplatin injection  What is this medicine?  CARBOPLATIN (KAR boe  pla tin) is a chemotherapy drug. It targets fast dividing cells, like cancer cells, and causes these cells to die. This medicine is used to treat ovarian cancer and many other cancers.  This medicine may be used for other purposes; ask your health care provider or pharmacist if you have questions.  What should I tell my health care provider before I take this medicine?  They need to know if you have any of these conditions:  -blood disorders  -hearing problems  -kidney disease  -recent or ongoing radiation therapy  -an unusual or allergic reaction to carboplatin, cisplatin, other chemotherapy, other medicines, foods, dyes, or preservatives  -pregnant or trying to get pregnant  -breast-feeding  How should I use this medicine?  This drug is usually given as an infusion into a vein. It is administered in a hospital or clinic by a specially trained health care professional.  Talk to your pediatrician regarding the use of this medicine in children. Special care may be needed.  Overdosage: If you think you have taken   too much of this medicine contact a poison control center or emergency room at once.  NOTE: This medicine is only for you. Do not share this medicine with others.  What if I miss a dose?  It is important not to miss a dose. Call your doctor or health care professional if you are unable to keep an appointment.  What may interact with this medicine?  -medicines for seizures  -medicines to increase blood counts like filgrastim, pegfilgrastim, sargramostim  -some antibiotics like amikacin, gentamicin, neomycin, streptomycin, tobramycin  -vaccines  Talk to your doctor or health care professional before taking any of these medicines:  -acetaminophen  -aspirin  -ibuprofen  -ketoprofen  -naproxen  This list may not describe all possible interactions. Give your health care provider a list of all the medicines, herbs, non-prescription drugs, or dietary supplements you use. Also tell them if you smoke, drink alcohol, or  use illegal drugs. Some items may interact with your medicine.  What should I watch for while using this medicine?  Your condition will be monitored carefully while you are receiving this medicine. You will need important blood work done while you are taking this medicine.  This drug may make you feel generally unwell. This is not uncommon, as chemotherapy can affect healthy cells as well as cancer cells. Report any side effects. Continue your course of treatment even though you feel ill unless your doctor tells you to stop.  In some cases, you may be given additional medicines to help with side effects. Follow all directions for their use.  Call your doctor or health care professional for advice if you get a fever, chills or sore throat, or other symptoms of a cold or flu. Do not treat yourself. This drug decreases your body's ability to fight infections. Try to avoid being around people who are sick.  This medicine may increase your risk to bruise or bleed. Call your doctor or health care professional if you notice any unusual bleeding.  Be careful brushing and flossing your teeth or using a toothpick because you may get an infection or bleed more easily. If you have any dental work done, tell your dentist you are receiving this medicine.  Avoid taking products that contain aspirin, acetaminophen, ibuprofen, naproxen, or ketoprofen unless instructed by your doctor. These medicines may hide a fever.  Do not become pregnant while taking this medicine. Women should inform their doctor if they wish to become pregnant or think they might be pregnant. There is a potential for serious side effects to an unborn child. Talk to your health care professional or pharmacist for more information. Do not breast-feed an infant while taking this medicine.  What side effects may I notice from receiving this medicine?  Side effects that you should report to your doctor or health care professional as soon as possible:  -allergic  reactions like skin rash, itching or hives, swelling of the face, lips, or tongue  -signs of infection - fever or chills, cough, sore throat, pain or difficulty passing urine  -signs of decreased platelets or bleeding - bruising, pinpoint red spots on the skin, black, tarry stools, nosebleeds  -signs of decreased red blood cells - unusually weak or tired, fainting spells, lightheadedness  -breathing problems  -changes in hearing  -changes in vision  -chest pain  -high blood pressure  -low blood counts - This drug may decrease the number of white blood cells, red blood cells and platelets. You may be at increased risk for   infections and bleeding.  -nausea and vomiting  -pain, swelling, redness or irritation at the injection site  -pain, tingling, numbness in the hands or feet  -problems with balance, talking, walking  -trouble passing urine or change in the amount of urine  Side effects that usually do not require medical attention (report to your doctor or health care professional if they continue or are bothersome):  -hair loss  -loss of appetite  -metallic taste in the mouth or changes in taste  This list may not describe all possible side effects. Call your doctor for medical advice about side effects. You may report side effects to FDA at 1-800-FDA-1088.  Where should I keep my medicine?  This drug is given in a hospital or clinic and will not be stored at home.  NOTE: This sheet is a summary. It may not cover all possible information. If you have questions about this medicine, talk to your doctor, pharmacist, or health care provider.      2016, Elsevier/Gold Standard. (2007-04-26 14:38:05)

## 2014-11-12 NOTE — Progress Notes (Signed)
[facility] [phone]   [phone]  OFFICE PROGRESS NOTE  [name], MD [place] [address]  DIAGNOSIS: Limited stage IIIB (T3, N3, M0) small cell lung cancer diagnosed in August 2016.  PRIOR THERAPY: None  CURRENT THERAPY: Systemic chemotherapy was carboplatin for AUC of 5 on day 1 and etoposide 120 MG/M2 on days 1, 2 and 3 with Neulasta support on day 4. Status post 2 cycles.  INTERVAL HISTORY: Karl [name] 79 y.o. male returns to the clinic today for follow-up visit accompanied by his wife. The patient has been feeling well. Tolerating chemo without difficulty. The patient denied having any significant chest pain, shortness breath, cough or hemoptysis. He continues to have mild fatigue. He denied having any significant weight loss or night sweats. No current fever or chills, no nausea or vomiting. He is here for evaluation with cycle #3 and to review recent restaging CT scans.  MEDICAL HISTORY: Past Medical History  Diagnosis Date  . Coronary artery disease     status post CABG by Dr. Redmond Pulling in 1993 and status post percutaneous transluminal coronary angioplasty and stenting by Dr. Wynonia Lawman in 2002 and 2006  . Carotid artery occlusion     severe left internal carotid artery stenosis, asymptomatic status post carotid endarterectomy  . Hypertensive heart disease without CHF   . Carotid artery disease   . Hyperlipidemia   . ICD (implantable cardiac defibrillator) in place   . CHF (congestive heart failure) ([place])   . Anginal pain ([place])   . Pacemaker   . Type 2 diabetes mellitus with vascular disease ([place]) 12/11/2008  . H/O hiatal hernia     "gone after my bypass"  . Arthritis     "neck, back, knees"  . Ischemic cardiomyopathy   . Atrial fibrillation ([place])   . Rheumatoid arthritis(714.0)   . AICD (automatic cardioverter/defibrillator) present   . 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 Allergy   . Lung cancer ([place])  09/11/14    eft upper lobe lung  . Myocardial infarction ([place]) ~ 1992    ALLERGIES:  is allergic to amiodarone and rosiglitazone-metformin.  MEDICATIONS:  Current Outpatient Prescriptions  Medication Sig Dispense Refill  . carvedilol (COREG) 3.125 MG tablet Take 6.25 mg by mouth 2 (two) times daily with a meal.     . Cyanocobalamin (VITAMIN B-12) 2500 MCG SUBL Place 1 tablet under the tongue daily.      Marland Kitchen dofetilide (TIKOSYN) 250 MCG capsule Take 250 mcg by mouth 2 (two) times daily.    Marland Kitchen HUMALOG KWIKPEN 100 UNIT/ML KiwkPen Inject 4 Units into the skin 3 (three) times daily after meals.   6  . insulin detemir (LEVEMIR) 100 UNIT/ML injection Inject 28 Units into the skin daily.     . nitroGLYCERIN (NITROSTAT) 0.4 MG SL tablet Place 0.4 mg under the tongue every 5 (five) minutes as needed. For chest pain    . NOVOFINE 32G X 6 MM MISC Inject 1 each into the skin as directed.   12  . Omega-3 Fatty Acids (FISH OIL) 1000 MG CAPS Take 1,000 mg by mouth 2 (two) times daily.     Marland Kitchen PRADAXA 150 MG CAPS capsule TAKE 1 CAPSULE BY MOUTH TWICE DAILY (Patient taking differently: TAKE 150 MG  BY MOUTH TWICE DAILY) 60 capsule 5  . simvastatin (ZOCOR) 40 MG tablet Take 40 mg by mouth daily.      No current  facility-administered medications for this visit.   Facility-Administered Medications Ordered in Other Visits  Medication Dose Route Frequency Provider Last Rate Last Dose  . etoposide (VEPESID) 260 mg in sodium chloride 0.9 % 650 mL chemo infusion  120 mg/m2 (Treatment Plan Actual) Intravenous Once Karl Bears, MD      . LORazepam (ATIVAN) injection 0.5 mg  0.5 mg Intravenous PRN Karl Bears, MD        SURGICAL HISTORY:  Past Surgical History  Procedure Laterality Date  . Carotid endarterectomy  2009    left  . Cardiac defibrillator placement  05/2006    [employer] Jude   . Eye surgery  March 2013    Cataract Left eye  . Eye surgery  06/08/2011    Cataract Right eye  . Pr vein bypass  graft,aorto-fem-pop    . Inguinal hernia repair  1970's    bilaterally  . Hemorrhoid surgery  1970's  . Coronary angioplasty with stent placement      "I've got 2" (08/10/11)  . Cardioversion  08/2010  . Insert / replace / remove pacemaker  05/2006    "got a defibrillator/pacemaker" (08/10/11)  . Coronary artery bypass graft  08/1991    CABG X5  . Cardioversion  08/13/2011    Procedure: CARDIOVERSION;  Surgeon: Jacolyn Reedy, MD;  Location: [facility];  Service: Cardiovascular;  Laterality: N/A;  . Left heart catheterization with coronary/graft angiogram N/A 06/16/2012    Procedure: LEFT HEART CATHETERIZATION WITH Beatrix Fetters;  Surgeon: Jacolyn Reedy, MD;  Location: [facility] CATH LAB;  Service: Cardiovascular;  Laterality: N/A;  . Video bronchoscopy Bilateral 09/11/2014    Procedure: VIDEO BRONCHOSCOPY WITH FLUORO;  Surgeon: Juanito Doom, MD;  Location: [facility];  Service: Cardiopulmonary;  Laterality: Bilateral;  . Cataract surgery Bilateral     REVIEW OF SYSTEMS:  A comprehensive review of systems was negative except for: Constitutional: positive for fatigue   PHYSICAL EXAMINATION: General appearance: alert, cooperative, fatigued and no distress Head: Normocephalic, without obvious abnormality, atraumatic Neck: no adenopathy, no JVD, supple, symmetrical, trachea midline and thyroid not enlarged, symmetric, no tenderness/mass/nodules Lymph nodes: Cervical, supraclavicular, and axillary nodes normal. Resp: clear to auscultation bilaterally Back: symmetric, no curvature. ROM normal. No CVA tenderness. Cardio: regular rate and rhythm, S1, S2 normal, no murmur, click, rub or gallop GI: soft, non-tender; bowel sounds normal; no masses,  no organomegaly Extremities: extremities normal, atraumatic, no cyanosis or edema  ECOG PERFORMANCE STATUS: 1 - Symptomatic but completely ambulatory  Blood pressure 126/55, pulse 70, temperature 97.9 F (36.6 C), temperature source Oral, resp.  rate 18, height '5\' 11"'$  (1.803 m), weight 196 lb 8 oz (89.132 kg), SpO2 96 %.  LABORATORY DATA: Lab Results  Component Value Date   WBC 10.4* 11/12/2014   HGB 10.8* 11/12/2014   HCT 32.1* 11/12/2014   MCV 90.2 11/12/2014   PLT 323 11/12/2014      Chemistry      Component Value Date/Time   NA 139 11/12/2014 1030   NA 137 10/14/2014 0504   K 4.3 11/12/2014 1030   K 4.2 10/14/2014 0504   CL 108 10/14/2014 0504   CO2 22 11/12/2014 1030   CO2 23 10/14/2014 0504   BUN 12.9 11/12/2014 1030   BUN 8 10/14/2014 0504   CREATININE 1.0 11/12/2014 1030   CREATININE 0.82 10/14/2014 0504      Component Value Date/Time   CALCIUM 9.2 11/12/2014 1030   CALCIUM 8.5* 10/14/2014 0504   ALKPHOS 117 11/12/2014  1030   ALKPHOS 97 10/12/2014 0353   AST 13 11/12/2014 1030   AST 41 10/12/2014 0353   ALT 16 11/12/2014 1030   ALT 61 10/12/2014 0353   BILITOT 0.30 11/12/2014 1030   BILITOT 0.8 10/12/2014 0353       RADIOGRAPHIC STUDIES: Ct Chest W Contrast  11/09/2014   CLINICAL DATA:  Subsequent encounter for small-cell lung cancer  EXAM: CT CHEST WITH CONTRAST  TECHNIQUE: Multidetector CT imaging of the chest was performed during intravenous contrast administration.  CONTRAST:  53m OMNIPAQUE IOHEXOL 300 MG/ML  SOLN  COMPARISON:  PET-CT from 09/28/2014.  Chest CT from 09/07/2014.  FINDINGS: Mediastinum / Lymph Nodes: Left-sided permanent pacemaker again noted. There is no axillary lymphadenopathy. Small lymph nodes scattered in the superior mediastinum are again noted. Dominant 16 mm short axis lymph nodes seen in the prevascular space previously has almost completely resolved, measuring only 4 mm in short axis today. 15 mm short axis AP window lymph node seen previously is now 7 mm in short axis. 16 mm short axis precarinal lymph node seen previously is 14 mm today. Decrease in left hilar nodal tissue noted. Nodal tissue in the right hilum and subcarinal station has associated dystrophic calcification.  The heart is enlarged without pericardial effusion. Patient is status post CABG.  Lungs / Pleura: The with 6.2 x 5.1 cm left upper lobe mass seen previously has decreased in the interval, measuring 4.0 x 2.0 cm today. Interstitial and airspace disease tracks centrally into the upper left hilum, as before, but the bulk of this disease has also decreased in the interval. There is diffuse interstitial disease in the lungs bilaterally, with chronic features and stable appearance. No new airspace consolidation. No pleural effusion.  MSK / Soft Tissues: Bone windows reveal no worrisome lytic or sclerotic osseous lesions.  Upper Abdomen:  Unremarkable.  IMPRESSION: 1. Substantial interval decrease in the left upper lobe mass and satellite lesions. 2. Interval decrease in mediastinal and left hilar lymphadenopathy. 3. Stable appearance of chronic interstitial lung disease.   Electronically Signed   By: EMisty StanleyM.D.   On: 11/09/2014 15:05    ASSESSMENT AND PLAN: This is a very pleasant 79year old white male with limited stage small cell lung cancer currently undergoing systemic chemotherapy with carboplatin and etoposide status post 2 cycles. The patient is tolerating his treatment fairly well except for neutropenic fever after cycle #1 and he recovered quickly.   Patient was seen today with Dr. MJulien Nordmann Restaging CT scan results and images were reviewed with the patient and his wife. He has had a significant response to chemotherapy. Recommended for the patient to proceed with cycle #3 as a scheduled. He will come back for follow-up visit in 3 weeks prior to cycle 4.  The patient is under consideration for concurrent radiotherapy. Due to the location of the defibrillator, this will need to be removed before radiation can begin. The defibrillator will be removed in about 2 weeks.  The patient was advised to call immediately if he has any concerning symptoms in the interval. The patient voices understanding  of current disease status and treatment options and is in agreement with the current care plan.  All questions were answered. The patient knows to call the clinic with any problems, questions or concerns. We can certainly see the patient much sooner if necessary.   KMikey Bussing DNP, AGPCNP-BC, AOCNP  ADDENDUM: Hematology/Oncology Attending: I had a face to face encounter with the patient today.  I recommended his care plan. This is a very pleasant 79 years old white male with limited stage small cell lung cancer currently undergoing systemic chemotherapy with carboplatin and etoposide status post 2 cycles. The patient has been tolerating his treatment fairly well with no significant adverse effects except for neutropenic fever after cycle #1 and that recovered quickly. His recent CT scan of the chest, abdomen and pelvis showed significant improvement in his disease. I discussed the scan results and showed the images to the patient and his wife today. I recommended for him to continue his current treatment with carboplatin and etoposide as a scheduled. He'll receive cycle #3 today. He is expected to start concurrent radiotherapy after changing the position of the defibrillator to the right side. The patient would come back for follow-up visit in 3 weeks for reevaluation with the start of cycle #4.  He was advised to call immediately if he has any concerning symptoms in the interval.  Disclaimer: This note was dictated with voice recognition software. Similar sounding words can inadvertently be transcribed and may be missed upon review. Eilleen Kempf., MD 11/12/2014

## 2014-11-13 ENCOUNTER — Ambulatory Visit (HOSPITAL_BASED_OUTPATIENT_CLINIC_OR_DEPARTMENT_OTHER): Payer: Medicare Other

## 2014-11-13 VITALS — BP 141/53 | HR 77 | Temp 98.0°F | Resp 18

## 2014-11-13 DIAGNOSIS — Z5111 Encounter for antineoplastic chemotherapy: Secondary | ICD-10-CM | POA: Diagnosis not present

## 2014-11-13 DIAGNOSIS — C3412 Malignant neoplasm of upper lobe, left bronchus or lung: Secondary | ICD-10-CM

## 2014-11-13 DIAGNOSIS — C3492 Malignant neoplasm of unspecified part of left bronchus or lung: Secondary | ICD-10-CM

## 2014-11-13 MED ORDER — SODIUM CHLORIDE 0.9 % IV SOLN
Freq: Once | INTRAVENOUS | Status: AC
  Administered 2014-11-13: 10:00:00 via INTRAVENOUS
  Filled 2014-11-13: qty 1

## 2014-11-13 MED ORDER — LORAZEPAM 2 MG/ML IJ SOLN
0.5000 mg | INTRAMUSCULAR | Status: AC | PRN
  Administered 2014-11-13: 0.5 mg via INTRAVENOUS

## 2014-11-13 MED ORDER — SODIUM CHLORIDE 0.9 % IV SOLN
120.0000 mg/m2 | Freq: Once | INTRAVENOUS | Status: AC
  Administered 2014-11-13: 260 mg via INTRAVENOUS
  Filled 2014-11-13: qty 13

## 2014-11-13 MED ORDER — LORAZEPAM 2 MG/ML IJ SOLN
INTRAMUSCULAR | Status: AC
  Filled 2014-11-13: qty 1

## 2014-11-13 MED ORDER — SODIUM CHLORIDE 0.9 % IV SOLN
Freq: Once | INTRAVENOUS | Status: AC
  Administered 2014-11-13: 10:00:00 via INTRAVENOUS

## 2014-11-13 NOTE — Patient Instructions (Signed)
[facility] Discharge Instructions for Patients Receiving Chemotherapy  Today you received the following chemotherapy agents: Etoposide   To help prevent nausea and vomiting after your treatment, we encourage you to take your nausea medication as directed.    If you develop nausea and vomiting that is not controlled by your nausea medication, call the clinic.   BELOW ARE SYMPTOMS THAT SHOULD BE REPORTED IMMEDIATELY:  *FEVER GREATER THAN 100.5 F  *CHILLS WITH OR WITHOUT FEVER  NAUSEA AND VOMITING THAT IS NOT CONTROLLED WITH YOUR NAUSEA MEDICATION  *UNUSUAL SHORTNESS OF BREATH  *UNUSUAL BRUISING OR BLEEDING  TENDERNESS IN MOUTH AND THROAT WITH OR WITHOUT PRESENCE OF ULCERS  *URINARY PROBLEMS  *BOWEL PROBLEMS  UNUSUAL RASH Items with * indicate a potential emergency and should be followed up as soon as possible.  Feel free to call the clinic you have any questions or concerns. The clinic phone number is [phone].  Please show the CHEMO ALERT CARD at check-in to the Emergency Department and triage nurse.   

## 2014-11-14 ENCOUNTER — Ambulatory Visit (HOSPITAL_BASED_OUTPATIENT_CLINIC_OR_DEPARTMENT_OTHER): Payer: Medicare Other

## 2014-11-14 VITALS — BP 134/57 | HR 65 | Temp 97.7°F | Resp 20

## 2014-11-14 DIAGNOSIS — Z5111 Encounter for antineoplastic chemotherapy: Secondary | ICD-10-CM | POA: Diagnosis not present

## 2014-11-14 DIAGNOSIS — C3492 Malignant neoplasm of unspecified part of left bronchus or lung: Secondary | ICD-10-CM

## 2014-11-14 DIAGNOSIS — C3412 Malignant neoplasm of upper lobe, left bronchus or lung: Secondary | ICD-10-CM | POA: Diagnosis not present

## 2014-11-14 MED ORDER — SODIUM CHLORIDE 0.9 % IV SOLN
Freq: Once | INTRAVENOUS | Status: AC
  Administered 2014-11-14: 11:00:00 via INTRAVENOUS

## 2014-11-14 MED ORDER — SODIUM CHLORIDE 0.9 % IV SOLN
Freq: Once | INTRAVENOUS | Status: AC
  Administered 2014-11-14: 11:00:00 via INTRAVENOUS
  Filled 2014-11-14: qty 1

## 2014-11-14 MED ORDER — SODIUM CHLORIDE 0.9 % IV SOLN
120.0000 mg/m2 | Freq: Once | INTRAVENOUS | Status: AC
  Administered 2014-11-14: 260 mg via INTRAVENOUS
  Filled 2014-11-14: qty 13

## 2014-11-14 NOTE — Patient Instructions (Signed)
[facility] Discharge Instructions for Patients Receiving Chemotherapy  Today you received the following chemotherapy agents: Etoposide   To help prevent nausea and vomiting after your treatment, we encourage you to take your nausea medication as directed.    If you develop nausea and vomiting that is not controlled by your nausea medication, call the clinic.   BELOW ARE SYMPTOMS THAT SHOULD BE REPORTED IMMEDIATELY:  *FEVER GREATER THAN 100.5 F  *CHILLS WITH OR WITHOUT FEVER  NAUSEA AND VOMITING THAT IS NOT CONTROLLED WITH YOUR NAUSEA MEDICATION  *UNUSUAL SHORTNESS OF BREATH  *UNUSUAL BRUISING OR BLEEDING  TENDERNESS IN MOUTH AND THROAT WITH OR WITHOUT PRESENCE OF ULCERS  *URINARY PROBLEMS  *BOWEL PROBLEMS  UNUSUAL RASH Items with * indicate a potential emergency and should be followed up as soon as possible.  Feel free to call the clinic you have any questions or concerns. The clinic phone number is [phone].  Please show the CHEMO ALERT CARD at check-in to the Emergency Department and triage nurse.   

## 2014-11-15 ENCOUNTER — Encounter: Payer: Self-pay | Admitting: *Deleted

## 2014-11-15 NOTE — Progress Notes (Signed)
[place] Psychosocial Distress Screening Clinical Social Work  Clinical Social Work was referred by distress screening protocol.  The patient scored a 5 on the Psychosocial Distress Thermometer which indicates moderate distress. Clinical Social Worker contacted patient by phone to assess for distress and other psychosocial needs.  Karl [name] shared he no longer considers the following causes of distress.  He reported he met with his medical oncologist last week and was informed his cancer was shrinking, responding to the treatment.  The patient shared this made him feel encouraged and hopeful.  CSW normalized patient's feelings of anxiety when newly diagnosed and discussed common emotions related to cancer treatment.  CSW encouraged patient to contact CSW as needed throughout this cancer journey.  ONCBCN DISTRESS SCREENING 10/31/2014  Screening Type Initial Screening  Distress experienced in past week (1-10) 5  Practical problem type Insurance  Emotional problem type Adjusting to illness  Spiritual/Religous concerns type Loss of sense of purpose  Information Concerns Type Lack of info about diagnosis  Physical Problem type Getting around  Physician notified of physical symptoms Yes  Referral to clinical social work Yes  Referral to financial advocate Yes    Clinical Social Worker follow up needed: No.  If yes, follow up plan:  Polo Riley, MSW, LCSW, OSW-C Clinical Social Worker [place] [phone]

## 2014-11-16 ENCOUNTER — Ambulatory Visit (HOSPITAL_BASED_OUTPATIENT_CLINIC_OR_DEPARTMENT_OTHER): Payer: Medicare Other

## 2014-11-16 VITALS — BP 134/54 | HR 66 | Temp 97.5°F | Resp 20

## 2014-11-16 DIAGNOSIS — Z5189 Encounter for other specified aftercare: Secondary | ICD-10-CM | POA: Diagnosis not present

## 2014-11-16 DIAGNOSIS — C3412 Malignant neoplasm of upper lobe, left bronchus or lung: Secondary | ICD-10-CM | POA: Diagnosis not present

## 2014-11-16 DIAGNOSIS — C3492 Malignant neoplasm of unspecified part of left bronchus or lung: Secondary | ICD-10-CM

## 2014-11-16 MED ORDER — PEGFILGRASTIM INJECTION 6 MG/0.6ML ~~LOC~~
6.0000 mg | PREFILLED_SYRINGE | Freq: Once | SUBCUTANEOUS | Status: AC
  Administered 2014-11-16: 6 mg via SUBCUTANEOUS
  Filled 2014-11-16: qty 0.6

## 2014-11-16 NOTE — Patient Instructions (Signed)
Pegfilgrastim injection What is this medicine? PEGFILGRASTIM (peg fil GRA stim) is a long-acting granulocyte colony-stimulating factor that stimulates the growth of neutrophils, a type of white blood cell important in the body's fight against infection. It is used to reduce the incidence of fever and infection in patients with certain types of cancer who are receiving chemotherapy that affects the bone marrow. This medicine may be used for other purposes; ask your health care provider or pharmacist if you have questions. COMMON BRAND NAME(S): Neulasta What should I tell my health care provider before I take this medicine? They need to know if you have any of these conditions: -latex allergy -ongoing radiation therapy -sickle cell disease -skin reactions to acrylic adhesives (On-Body Injector only) -an unusual or allergic reaction to pegfilgrastim, filgrastim, other medicines, foods, dyes, or preservatives -pregnant or trying to get pregnant -breast-feeding How should I use this medicine? This medicine is for injection under the skin. If you get this medicine at home, you will be taught how to prepare and give the pre-filled syringe or how to use the On-body Injector. Refer to the patient Instructions for Use for detailed instructions. Use exactly as directed. Take your medicine at regular intervals. Do not take your medicine more often than directed. It is important that you put your used needles and syringes in a special sharps container. Do not put them in a trash can. If you do not have a sharps container, call your pharmacist or healthcare provider to get one. Talk to your pediatrician regarding the use of this medicine in children. Special care may be needed. Overdosage: If you think you have taken too much of this medicine contact a poison control center or emergency room at once. NOTE: This medicine is only for you. Do not share this medicine with others. What if I miss a dose? It is  important not to miss your dose. Call your doctor or health care professional if you miss your dose. If you miss a dose due to an On-body Injector failure or leakage, a new dose should be administered as soon as possible using a single prefilled syringe for manual use. What may interact with this medicine? Interactions have not been studied. Give your health care provider a list of all the medicines, herbs, non-prescription drugs, or dietary supplements you use. Also tell them if you smoke, drink alcohol, or use illegal drugs. Some items may interact with your medicine. This list may not describe all possible interactions. Give your health care provider a list of all the medicines, herbs, non-prescription drugs, or dietary supplements you use. Also tell them if you smoke, drink alcohol, or use illegal drugs. Some items may interact with your medicine. What should I watch for while using this medicine? You may need blood work done while you are taking this medicine. If you are going to need a MRI, CT scan, or other procedure, tell your doctor that you are using this medicine (On-Body Injector only). What side effects may I notice from receiving this medicine? Side effects that you should report to your doctor or health care professional as soon as possible: -allergic reactions like skin rash, itching or hives, swelling of the face, lips, or tongue -dizziness -fever -pain, redness, or irritation at site where injected -pinpoint red spots on the skin -shortness of breath or breathing problems -stomach or side pain, or pain at the shoulder -swelling -tiredness -trouble passing urine Side effects that usually do not require medical attention (report to your doctor   or health care professional if they continue or are bothersome): -bone pain -muscle pain This list may not describe all possible side effects. Call your doctor for medical advice about side effects. You may report side effects to FDA at  1-800-FDA-1088. Where should I keep my medicine? Keep out of the reach of children. Store pre-filled syringes in a refrigerator between 2 and 8 degrees C (36 and 46 degrees F). Do not freeze. Keep in carton to protect from light. Throw away this medicine if it is left out of the refrigerator for more than 48 hours. Throw away any unused medicine after the expiration date. NOTE: This sheet is a summary. It may not cover all possible information. If you have questions about this medicine, talk to your doctor, pharmacist, or health care provider.  2015, [employer]. (2013-04-20 16:14:05)  

## 2014-11-19 ENCOUNTER — Other Ambulatory Visit (HOSPITAL_BASED_OUTPATIENT_CLINIC_OR_DEPARTMENT_OTHER): Payer: Medicare Other

## 2014-11-19 DIAGNOSIS — C3412 Malignant neoplasm of upper lobe, left bronchus or lung: Secondary | ICD-10-CM | POA: Diagnosis not present

## 2014-11-19 DIAGNOSIS — C3492 Malignant neoplasm of unspecified part of left bronchus or lung: Secondary | ICD-10-CM

## 2014-11-19 LAB — COMPREHENSIVE METABOLIC PANEL (CC13)
ALT: 15 U/L (ref 0–55)
ANION GAP: 6 meq/L (ref 3–11)
AST: 12 U/L (ref 5–34)
Albumin: 3.4 g/dL — ABNORMAL LOW (ref 3.5–5.0)
Alkaline Phosphatase: 128 U/L (ref 40–150)
BILIRUBIN TOTAL: 0.79 mg/dL (ref 0.20–1.20)
BUN: 15.4 mg/dL (ref 7.0–26.0)
CO2: 25 meq/L (ref 22–29)
CREATININE: 0.9 mg/dL (ref 0.7–1.3)
Calcium: 9.4 mg/dL (ref 8.4–10.4)
Chloride: 108 mEq/L (ref 98–109)
EGFR: 81 mL/min/{1.73_m2} — ABNORMAL LOW (ref >90)
GLUCOSE: 269 mg/dL — AB (ref 70–140)
Potassium: 5.1 mEq/L (ref 3.5–5.1)
Sodium: 140 mEq/L (ref 136–145)
TOTAL PROTEIN: 6.7 g/dL (ref 6.4–8.3)

## 2014-11-19 LAB — CBC WITH DIFFERENTIAL/PLATELET
BASO%: 0.2 % (ref 0.0–2.0)
BASOS ABS: 0 10*3/uL (ref 0.0–0.1)
EOS ABS: 0.1 10*3/uL (ref 0.0–0.5)
EOS%: 0.3 % (ref 0.0–7.0)
HEMATOCRIT: 29.2 % — AB (ref 38.4–49.9)
HEMOGLOBIN: 9.7 g/dL — AB (ref 13.0–17.1)
LYMPH#: 1.8 10*3/uL (ref 0.9–3.3)
LYMPH%: 10 % — ABNORMAL LOW (ref 14.0–49.0)
MCH: 30.4 pg (ref 27.2–33.4)
MCHC: 33.2 g/dL (ref 32.0–36.0)
MCV: 91.5 fL (ref 79.3–98.0)
MONO#: 0.2 10*3/uL (ref 0.1–0.9)
MONO%: 0.9 % (ref 0.0–14.0)
NEUT#: 15.8 10*3/uL — ABNORMAL HIGH (ref 1.5–6.5)
NEUT%: 88.6 % — ABNORMAL HIGH (ref 39.0–75.0)
PLATELETS: 153 10*3/uL (ref 140–400)
RBC: 3.19 10*6/uL — ABNORMAL LOW (ref 4.20–5.82)
RDW: 15.4 % — AB (ref 11.0–14.6)
WBC: 17.8 10*3/uL — ABNORMAL HIGH (ref 4.0–10.3)

## 2014-11-19 LAB — MAGNESIUM (CC13): MAGNESIUM: 1.8 mg/dL (ref 1.5–2.5)

## 2014-11-22 ENCOUNTER — Encounter: Payer: Self-pay | Admitting: Cardiology

## 2014-11-22 NOTE — Progress Notes (Unsigned)
Patient ID: YOSHIO SELIGA, male   DOB: [birth year], 79 y.o.   MRN: [id number]  [place], [place]  Date of visit:  11/22/2014 DOB:  1935-04-11    Age:  79 yrs. Medical record number:  [id number]     Account number:  [id number] Primary Care Provider: [name] ____________________________ CURRENT DIAGNOSES  1. Malignant Neoplasm Of Upper Lobe, Left Bronchus Or Lung  2. CAD Native without angina  3. Paroxysmal atrial fibrillation  4. Chronic systolic (congestive) heart failure  5. Long term (current) use of anticoagulants  6. Presence of automatic (implantable) cardiac defibrillator  7. Ischemic cardiomyopathy  8. Atherosclerosis of autologous vein coronary artery bypass graft(s) with unspecified angina pectoris  9. Type 2 diabetes mellitus with other circulatory complications  10. Hyperlipidemia  11. Idiopathic pulmonary fibrosis  12. Rheumatoid arthritis, unspecified  13. Essential (primary) hypertension  14. Old myocardial infarction  15. Stent Placement ____________________________ ALLERGIES  Amiodarone, Tremor and bradycardia  Metformin, Intolerance-unknown  Rosiglitazone, Intolerance-unknown ____________________________ MEDICATIONS  1. Fish Oil Concentrate 1,000 mg Capsule, bid  2. Vitamin B-12 2,500 mcg Tablet, Sublingual, 1 qd  3. Tylenol 325 mg tablet, PRN  4. nitroglycerin 0.4 mg tablet, sublingual, PRN  5. simvastatin 40 mg tablet, 1 p.o. daily  6. carvedilol 6.25 mg tablet, BID  7. Levemir 100 unit/mL subcutaneous solution, 30u qam  8. Humalog 100 unit/mL subcutaneous cartridge, 5u bid  9. Pradaxa 150 mg capsule, BID  10. Tikosyn 250 mcg capsule, BID ____________________________ CHIEF COMPLAINTS  Followup of CAD Native without angina  Followup of Chronic systolic (congestive) heart failure ____________________________ HISTORY OF PRESENT ILLNESS Patient seen for cardiac followup. Since he was previously here he has now developed small cell lung cancer underneath the  defibrillator. He thinks the cancer came to the defibrillator but I told him I did not think so. He is currently undergoing chemotherapy with carboplatin as well as etoposide. He is also receiving some immune therapy and says that the mass has shrunk some period he complains of some vague chest pains across his anterior chest it does not sound like angina. He is quite weak from the chemotherapy. He wants my opinion about moving the defibrillator versus discontinuation of therapy. He has had 2 shocks previously when he was in atrial fibrillation. He is largely maintained sinus rhythm since being on dofetilide. He has lost a good bit of weight since he was here. He denies PND, orthopnea or edema. ____________________________ PAST HISTORY  Past Medical Illnesses:  hypertension, DM-non-insulin dependent, hyperlipidemia, obesity, pulmonary fibrosis 2014, stage 3 lung cancer treated with chemothrerapy;  Cardiovascular Illnesses:  CAD, cardiomyopathy(ischemic), arrhythmia-PVCs, carotid artery disease, atrial fibrillation;  Surgical Procedures:  CABG w LIMA to LAD-dx, SVG to int-OM, SVG to RCA 08/22/91 Dr. Redmond Pulling, hemorrhoidectomy, inguinal herniorrhaphy-rt, inguinal herniorrhaphy-left, carotid endarterectomy-left 2009;  NYHA Classification:  II;  Canadian Angina Classification:  Class 0: Asymptomatic;  Cardiology Procedures-Invasive:  Stent circumflex vein graft 2000, Vision stent to distal SVG to circ 10/06, cardiac cath (left) September 2008, St. Jude AICD 4/08 Dr. Lovena Le, cardioversion July 2012, cardiac cath (left) August 2012, cardioversion July 2013;  Cardiology Procedures-Noninvasive:  lexiscan October 2010, event monitor May 2012, echocardiogram June 2014;  Cardiac Cath Results:  50% stenosis distal Left main, 99% stenosis proximal LAD, occluded CFX RCA, occluded RCA SVG, widely patent LAD Diag 1 LIMA graft, patent proximal and distal stent sites in graft to marginal branch with 40% stenosis between the stents;   Peripheral Vascular Procedures:  carotid doppler March  2012;  LVEF of 35% documented via echocardiogram on 07/06/2012,  CHADS Score:  3,  CHA2DS2-VASC Score:  4 ____________________________ CARDIO-PULMONARY TEST DATES EKG Date:  11/22/2014;   Cardiac Cath Date:  06/16/2012;  CABG: 08/22/1991;  Stent Placement Date: 11/11/2004;  Holter/Event Monitor Date: 07/02/2010;  Nuclear Study Date:  11/29/2008;  Echocardiography Date: 07/06/2012;  Chest Xray Date: 07/31/2011;   ____________________________ FAMILY HISTORY Brother -- Brother alive and well Brother -- Brother alive and well Brother -- Brother dead, Coronary Artery Disease Father -- Father dead, Myocardial infarct in 1st degree male relative <55 Mother -- Mother dead, Death of unknown cause Sister -- Sister alive and well ____________________________ SOCIAL HISTORY Alcohol Use:  does not use alcohol;  Smoking:  used to smoke but quit Prior to 1980;  Diet:  regular diet;  Lifestyle:  hunts and married;  Exercise:  no regular exercise;  Occupation:  retired and works part time at [employer];  Residence:  lives with wife;  Job Description:  worked at [employer];   ____________________________ REVIEW OF SYSTEMS General:  malaise and fatigue, weight loss of approximately 15 lbs Eyes: wears eye glasses/contact lenses Respiratory: dyspnea with exertion Cardiovascular:  please review HPI Abdominal: denies dyspepsia, GI bleeding, constipation, or diarrhea Genitourinary-Male: frequency  Musculoskeletal:  chronic low back pain, arthritis of the left knee, arthritis of the hips Neurological:  denies headaches, stroke, or TIA  ____________________________ PHYSICAL EXAMINATION VITAL SIGNS  Blood Pressure:  140/80 Sitting, Left arm, regular cuff  , 138/76 Standing, Left arm and regular cuff   Pulse:  80/min. Weight:  200.00 lbs. Height:  71"BMI: 28  Constitutional:  pleasant white male in no acute distress Skin:  solar keratosis face Head:   normocephalic, balding male hair pattern ENT:  full mouth dentures present, ears, nose and throat unremarkable Neck:  left carotid bruit present, no JVD, no masses, non-tender Chest:  healed median sternotomy scar, velvcro like crackles left lung base Cardiac:  regular rhythm, normal S1 and S2, no S3 or S4, no murmurs,clicks, or rub heard Peripheral Pulses:  the femoral,dorsalis pedis, and posterior tibial pulses are full and equal bilaterally with no bruits auscultated. Extremities & Back:  well healed saphenous vein donor site RLE, well healed saphenous vein donor site LLE, no edema present  ____________________________ MOST RECENT LIPID PANEL 10/31/13  CHOL TOTL 141 mg/dl, LDL 68 NM, HDL 28 mg/dl and TRIGLYCER 227 mg/dl ____________________________ IMPRESSIONS/PLAN  1. Ischemic cardiomyopathy 2. 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 Diagnosis of small cell lung cancer 4. Paroxysmal atrial fibrillation maintaining sinus rhythm 5. Long term use of anticoagulants  Recommendations:  Discussion about the defibrillator. He very much likes to want and I think the best option for him would be to remove the defibrillator temporarily and allow him to receive radiation therapy. While the defibrillator is gone I recommended that he wear a life vest which would give him psychological comfort while he is without the permanent defibrillator implant. If you've responds well to the treatment then the defibrillator could go back and on the left side which would enable him to do his hunting that he likes to do. Follow up with me in 3 months and call if problems.  ____________________________ TODAYS ORDERS  1. Return Visit: 3 months  2. 12 Lead EKG: Today                       ____________________________ Cardiology Physician:  Kerry Hough MD [facility]

## 2014-11-26 ENCOUNTER — Ambulatory Visit (HOSPITAL_COMMUNITY)
Admission: RE | Admit: 2014-11-26 | Discharge: 2014-11-26 | Disposition: A | Discharge status: Home / Self Care | Payer: Medicare Other | Source: Ambulatory Visit | Attending: Internal Medicine | Admitting: Internal Medicine

## 2014-11-26 ENCOUNTER — Other Ambulatory Visit: Payer: Medicare Other

## 2014-11-26 ENCOUNTER — Encounter (HOSPITAL_COMMUNITY)
Admission: RE | Disposition: A | Discharge status: Home / Self Care | Payer: Self-pay | Source: Ambulatory Visit | Attending: Internal Medicine

## 2014-11-26 DIAGNOSIS — Z794 Long term (current) use of insulin: Secondary | ICD-10-CM | POA: Insufficient documentation

## 2014-11-26 DIAGNOSIS — E785 Hyperlipidemia, unspecified: Secondary | ICD-10-CM | POA: Insufficient documentation

## 2014-11-26 DIAGNOSIS — C3490 Malignant neoplasm of unspecified part of unspecified bronchus or lung: Secondary | ICD-10-CM | POA: Diagnosis not present

## 2014-11-26 DIAGNOSIS — Z4502 Encounter for adjustment and management of automatic implantable cardiac defibrillator: Secondary | ICD-10-CM | POA: Diagnosis present

## 2014-11-26 DIAGNOSIS — I2510 Atherosclerotic heart disease of native coronary artery without angina pectoris: Secondary | ICD-10-CM | POA: Insufficient documentation

## 2014-11-26 DIAGNOSIS — Z95810 Presence of automatic (implantable) cardiac defibrillator: Secondary | ICD-10-CM | POA: Diagnosis present

## 2014-11-26 DIAGNOSIS — E119 Type 2 diabetes mellitus without complications: Secondary | ICD-10-CM | POA: Diagnosis not present

## 2014-11-26 DIAGNOSIS — Z79899 Other long term (current) drug therapy: Secondary | ICD-10-CM | POA: Diagnosis not present

## 2014-11-26 DIAGNOSIS — Z87891 Personal history of nicotine dependence: Secondary | ICD-10-CM | POA: Diagnosis not present

## 2014-11-26 DIAGNOSIS — I252 Old myocardial infarction: Secondary | ICD-10-CM | POA: Insufficient documentation

## 2014-11-26 DIAGNOSIS — Z01818 Encounter for other preprocedural examination: Secondary | ICD-10-CM | POA: Insufficient documentation

## 2014-11-26 DIAGNOSIS — I5022 Chronic systolic (congestive) heart failure: Secondary | ICD-10-CM | POA: Insufficient documentation

## 2014-11-26 DIAGNOSIS — Z951 Presence of aortocoronary bypass graft: Secondary | ICD-10-CM | POA: Diagnosis not present

## 2014-11-26 DIAGNOSIS — Z85828 Personal history of other malignant neoplasm of skin: Secondary | ICD-10-CM | POA: Diagnosis not present

## 2014-11-26 DIAGNOSIS — I255 Ischemic cardiomyopathy: Secondary | ICD-10-CM | POA: Insufficient documentation

## 2014-11-26 DIAGNOSIS — I4891 Unspecified atrial fibrillation: Secondary | ICD-10-CM | POA: Diagnosis not present

## 2014-11-26 HISTORY — PX: EP IMPLANTABLE DEVICE: SHX172B

## 2014-11-26 LAB — GLUCOSE, CAPILLARY
Glucose-Capillary: 144 mg/dL — ABNORMAL HIGH (ref 65–99)
Glucose-Capillary: 133 mg/dL — ABNORMAL HIGH (ref 65–99)

## 2014-11-26 LAB — SURGICAL PCR SCREEN
MRSA, PCR: NEGATIVE
Staphylococcus aureus: NEGATIVE

## 2014-11-26 SURGERY — ICD/BIV ICD GENERATOR REMOVAL

## 2014-11-26 MED ORDER — SODIUM CHLORIDE 0.9 % IV SOLN
INTRAVENOUS | Status: DC
  Administered 2014-11-26: 10:00:00 via INTRAVENOUS

## 2014-11-26 MED ORDER — LIDOCAINE HCL (PF) 1 % IJ SOLN
INTRAMUSCULAR | Status: AC
  Filled 2014-11-26: qty 60

## 2014-11-26 MED ORDER — FENTANYL CITRATE (PF) 100 MCG/2ML IJ SOLN
INTRAMUSCULAR | Status: DC | PRN
  Administered 2014-11-26: 12.5 ug via INTRAVENOUS
  Administered 2014-11-26: 25 ug via INTRAVENOUS

## 2014-11-26 MED ORDER — FENTANYL CITRATE (PF) 100 MCG/2ML IJ SOLN
INTRAMUSCULAR | Status: AC
  Filled 2014-11-26: qty 4

## 2014-11-26 MED ORDER — CHLORHEXIDINE GLUCONATE 4 % EX LIQD
60.0000 mL | Freq: Once | CUTANEOUS | Status: DC
  Filled 2014-11-26: qty 60

## 2014-11-26 MED ORDER — CEFAZOLIN SODIUM-DEXTROSE 2-3 GM-% IV SOLR: 2.0000 g | INTRAVENOUS | Status: DC

## 2014-11-26 MED ORDER — CEFAZOLIN SODIUM-DEXTROSE 2-3 GM-% IV SOLR
INTRAVENOUS | Status: DC | PRN
  Administered 2014-11-26: 2 g via INTRAVENOUS

## 2014-11-26 MED ORDER — LIDOCAINE HCL (PF) 1 % IJ SOLN
INTRAMUSCULAR | Status: DC | PRN
  Administered 2014-11-26: 32 mL via INTRADERMAL

## 2014-11-26 MED ORDER — SODIUM CHLORIDE 0.9 % IR SOLN
Status: AC
  Filled 2014-11-26: qty 2

## 2014-11-26 MED ORDER — CEFAZOLIN SODIUM-DEXTROSE 2-3 GM-% IV SOLR
INTRAVENOUS | Status: AC
  Filled 2014-11-26: qty 50

## 2014-11-26 MED ORDER — MUPIROCIN 2 % EX OINT
TOPICAL_OINTMENT | CUTANEOUS | Status: AC
  Administered 2014-11-26: 1 via TOPICAL
  Filled 2014-11-26: qty 22

## 2014-11-26 MED ORDER — MUPIROCIN 2 % EX OINT
1.0000 "application " | TOPICAL_OINTMENT | Freq: Once | CUTANEOUS | Status: AC
  Administered 2014-11-26: 1 via TOPICAL

## 2014-11-26 MED ORDER — MIDAZOLAM HCL 5 MG/5ML IJ SOLN
INTRAMUSCULAR | Status: DC | PRN
  Administered 2014-11-26 (×3): 1 mg via INTRAVENOUS

## 2014-11-26 MED ORDER — SODIUM CHLORIDE 0.9 % IR SOLN
80.0000 mg | Status: AC
  Administered 2014-11-26: 80 mg
  Filled 2014-11-26: qty 2

## 2014-11-26 MED ORDER — MIDAZOLAM HCL 5 MG/5ML IJ SOLN
INTRAMUSCULAR | Status: AC
  Filled 2014-11-26: qty 25

## 2014-11-26 SURGICAL SUPPLY — 3 items
CABLE SURGICAL S-101-97-12 (CABLE) ×3 IMPLANT
PAD DEFIB LIFELINK (PAD) ×3 IMPLANT
TRAY PACEMAKER INSERTION (PACKS) ×3 IMPLANT

## 2014-11-26 NOTE — Progress Notes (Signed)
Pt with ICD generator removed today due to need for XRT.  Dr Lovena Le discussed with patient - will plan LifeVest for the short term until final plans can be made regarding ICD re-implant with ICM.  I will order LifeVest today  Chanetta Marshall, NP 11/26/2014 1:46 PM   Mikle Bosworth.D.

## 2014-11-26 NOTE — Discharge Instructions (Signed)
° °  FOLLOW VERBAL INSTRUCTIONS FOR LIFE VEST AS GIVEN BY REPRESENTATIVE.  Pacemaker Battery Change, Care After Refer to this sheet in the next few weeks. These instructions provide you with information on caring for yourself after your procedure. Your health care provider may also give you more specific instructions. Your treatment has been planned according to current medical practices, but problems sometimes occur. Call your health care provider if you have any problems or questions after your procedure. WHAT TO EXPECT AFTER THE PROCEDURE After your procedure, it is typical to have the following sensations:  Soreness at the pacemaker site. HOME CARE INSTRUCTIONS   Keep the incision clean and dry.  Unless advised otherwise, you may shower beginning 48 hours after your procedure.  For the first week avoid stretching motions that pull at the incision site, and avoid heavy exercise with the arm that is on the same side as the incision.  Take medicines only as directed by your health care provider.  Keep all follow-up visits as directed by your health care provider. SEEK MEDICAL CARE IF:   You have pain at the incision site that is not relieved by over-the-counter or prescription medicine.  There is drainage or pus from the incision site.  There is swelling larger than a lime at the incision site.  You develop red streaking that extends above or below the incision site.  You feel brief, intermittent palpitations, light-headedness, or any symptoms that you feel might be related to your heart. SEEK IMMEDIATE MEDICAL CARE IF:   You experience chest pain that is different than the pain at the pacemaker site.  You experience shortness of breath.  You have palpitations or irregular heartbeat.  You have light-headedness that does not go away quickly.  You faint.  You have pain that gets worse and is not relieved by medicine.   This information is not intended to replace advice given  to you by your health care provider. Make sure you discuss any questions you have with your health care provider.   Document Released: 11/09/2012 Document Revised: 02/09/2014 Document Reviewed: 11/09/2012 Elsevier Interactive Patient Education [employer].

## 2014-11-26 NOTE — H&P (View-Only) (Signed)
HPI [name] returns today for ICD followup. He is a very pleasant 79 year old man with a history of atrial fibrillation, and an ischemic cardiomyopathy, status post bypass surgery in the past. He has chronic systolic heart failure which is well compensated and class II. He has done well on dofetilide for his atrial fibrillation. He does not feel palpitations or any symptomatic atrial fib. He denies chest pain or shortness of breath or syncope. No peripheral edema. His activity appears to be limited if anything by arthritis, particularly with pain in his left knee. He has been diagnosed with lung CA and is undergoin chemo and is pending XRT. The patient's tumor sits just behind his ICD. He is not at [facility] but has less than a year on his battery likely about 6 months. He is here today to discuss treatment options.  Allergies  Allergen Reactions  . Amiodarone Other (See Comments)    Tremor   . Rosiglitazone-Metformin     Unknown     Current Outpatient Prescriptions  Medication Sig Dispense Refill  . carvedilol (COREG) 3.125 MG tablet Take 6.25 mg by mouth 2 (two) times daily with a meal.     . Cyanocobalamin (VITAMIN B-12) 2500 MCG SUBL Place 1 tablet under the tongue daily.      Marland Kitchen dofetilide (TIKOSYN) 250 MCG capsule Take 250 mcg by mouth 2 (two) times daily.    Marland Kitchen HUMALOG KWIKPEN 100 UNIT/ML KiwkPen Inject 4 Units into the skin 3 (three) times daily after meals.   6  . insulin detemir (LEVEMIR) 100 UNIT/ML injection Inject 28 Units into the skin daily.     . nitroGLYCERIN (NITROSTAT) 0.4 MG SL tablet Place 0.4 mg under the tongue every 5 (five) minutes as needed. For chest pain    . NOVOFINE 32G X 6 MM MISC Inject 1 each into the skin as directed.   12  . Omega-3 Fatty Acids (FISH OIL) 1000 MG CAPS Take 1,000 mg by mouth 2 (two) times daily.     Marland Kitchen PRADAXA 150 MG CAPS capsule TAKE 1 CAPSULE BY MOUTH TWICE DAILY (Patient taking differently: TAKE 150 MG  BY MOUTH TWICE DAILY) 60 capsule 5  .  simvastatin (ZOCOR) 40 MG tablet Take 40 mg by mouth daily.      No current facility-administered medications for this visit.   Facility-Administered Medications Ordered in Other Visits  Medication Dose Route Frequency Provider Last Rate Last Dose  . LORazepam (ATIVAN) injection 0.5 mg  0.5 mg Intravenous PRN Curt Bears, MD         Past Medical History  Diagnosis Date  . Coronary artery disease     status post CABG by Dr. Redmond Pulling in 1993 and status post percutaneous transluminal coronary angioplasty and stenting by Dr. Wynonia Lawman in 2002 and 2006  . Carotid artery occlusion     severe left internal carotid artery stenosis, asymptomatic status post carotid endarterectomy  . Hypertensive heart disease without CHF   . Carotid artery disease   . Hyperlipidemia   . ICD (implantable cardiac defibrillator) in place   . CHF (congestive heart failure) ([place])   . Anginal pain ([place])   . Pacemaker   . Type 2 diabetes mellitus with vascular disease ([place]) 12/11/2008  . H/O hiatal hernia     "gone after my bypass"  . Arthritis     "neck, back, knees"  . Ischemic cardiomyopathy   . Atrial fibrillation ([place])   . Rheumatoid arthritis(714.0)   . AICD (automatic cardioverter/defibrillator) present   .  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 Allergy   . Lung cancer ([place]) 09/11/14    eft upper lobe lung  . Myocardial infarction ([place]) ~ 1992    ROS:   All systems reviewed and negative except as noted in the HPI.   Past Surgical History  Procedure Laterality Date  . Carotid endarterectomy  2009    left  . Cardiac defibrillator placement  05/2006    St Jude   . Eye surgery  March 2013    Cataract Left eye  . Eye surgery  06/08/2011    Cataract Right eye  . Pr vein bypass graft,aorto-fem-pop    . Inguinal hernia repair  1970's    bilaterally  . Hemorrhoid surgery  1970's  . Coronary angioplasty with stent placement      "I've got 2" (08/10/11)  . Cardioversion  08/2010  . Insert / replace /  remove pacemaker  05/2006    "got a defibrillator/pacemaker" (08/10/11)  . Coronary artery bypass graft  08/1991    CABG X5  . Cardioversion  08/13/2011    Procedure: CARDIOVERSION;  Surgeon: Jacolyn Reedy, MD;  Location: [facility];  Service: Cardiovascular;  Laterality: N/A;  . Left heart catheterization with coronary/graft angiogram N/A 06/16/2012    Procedure: LEFT HEART CATHETERIZATION WITH Beatrix Fetters;  Surgeon: Jacolyn Reedy, MD;  Location: [facility] CATH LAB;  Service: Cardiovascular;  Laterality: N/A;  . Video bronchoscopy Bilateral 09/11/2014    Procedure: VIDEO BRONCHOSCOPY WITH FLUORO;  Surgeon: Juanito Doom, MD;  Location: [place];  Service: Cardiopulmonary;  Laterality: Bilateral;  . Cataract surgery Bilateral      Family History  Problem Relation Age of Onset  . Diabetes Father   . Heart disease Father     Heart Disease before age 41  . Heart attack Father   . Stroke Mother      Social History   Social History  . Marital Status: Married    Spouse Name: N/A  . Number of Children: N/A  . Years of Education: N/A   Occupational History  . Not on file.   Social History Main Topics  . Smoking status: Former Smoker -- 1.00 packs/day for 26 years    Types: Cigarettes    Quit date: 10/20/1980  . Smokeless tobacco: Former Systems developer    Types: Chew    Quit date: 04/02/2013  . Alcohol Use: 1.2 oz/week    2 Cans of beer per week     Comment: per week stopped alcohol 2 years ago 1-2 beers occasuionally  . Drug Use: No  . Sexual Activity: Not Currently   Other Topics Concern  . Not on file   Social History Narrative     BP 128/62 mmHg  Pulse 66  Ht '5\' 11"'$  (1.803 m)  Wt 197 lb 12.8 oz (89.721 kg)  BMI 27.60 kg/m2  Physical Exam:  Well appearing 79 year old man, NAD HEENT: Unremarkable Neck:  7 cm JVD, no thyromegally Lungs:  Clear with no wheezes, rales, or rhonchi. HEART:  Regular rate rhythm, no murmurs, no rubs, no clicks Abd:  soft, positive  bowel sounds, no organomegally, no rebound, no guarding Ext:  2 plus pulses, no edema, no cyanosis, no clubbing Skin:  No rashes no nodules Neuro:  CN II through XII intact, motor grossly intact  DEVICE  Normal device function.  See PaceArt for details.   Assess/Plan:

## 2014-11-26 NOTE — Progress Notes (Addendum)
Pt states "Can't I go home and come back for life vest"  Dr Lovena Le notified and is ok with that plan.  Pt instructed to report to Dr Lovena Le office tomorrow at [place] and ask for [facility].  Pt and spouse verbalize understanding.  Kelly notified and will follow up with pt tonight.

## 2014-11-26 NOTE — Interval H&P Note (Signed)
History and Physical Interval Note:  11/26/2014 1:24 PM  Karl [name]  has presented today for surgery, with the diagnosis of left lung mass/pending radiation in that area  The various methods of treatment have been discussed with the patient and family. After consideration of risks, benefits and other options for treatment, the patient has consented to  Procedure(s):  ICD Generator Removal /Can only (N/A) as a surgical intervention .  The patient's history has been reviewed, patient examined, no change in status, stable for surgery.  I have reviewed the patient's chart and labs.  Questions were answered to the patient's satisfaction.     Cristopher Peru

## 2014-11-27 ENCOUNTER — Encounter (HOSPITAL_COMMUNITY): Payer: Self-pay | Admitting: Internal Medicine

## 2014-11-27 ENCOUNTER — Encounter: Payer: Medicare Other | Admitting: Internal Medicine

## 2014-11-30 ENCOUNTER — Ambulatory Visit
Admission: RE | Admit: 2014-11-30 | Discharge: 2014-11-30 | Disposition: A | Discharge status: Home / Self Care | Payer: Medicare Other | Source: Ambulatory Visit | Attending: Radiation Oncology | Admitting: Radiation Oncology

## 2014-11-30 DIAGNOSIS — C3412 Malignant neoplasm of upper lobe, left bronchus or lung: Secondary | ICD-10-CM

## 2014-12-03 ENCOUNTER — Ambulatory Visit (HOSPITAL_BASED_OUTPATIENT_CLINIC_OR_DEPARTMENT_OTHER): Payer: Medicare Other

## 2014-12-03 ENCOUNTER — Encounter: Payer: Self-pay | Admitting: Internal Medicine

## 2014-12-03 ENCOUNTER — Telehealth: Payer: Self-pay | Admitting: Internal Medicine

## 2014-12-03 ENCOUNTER — Other Ambulatory Visit (HOSPITAL_BASED_OUTPATIENT_CLINIC_OR_DEPARTMENT_OTHER): Payer: Medicare Other

## 2014-12-03 ENCOUNTER — Ambulatory Visit (HOSPITAL_BASED_OUTPATIENT_CLINIC_OR_DEPARTMENT_OTHER): Payer: Medicare Other | Admitting: Internal Medicine

## 2014-12-03 VITALS — BP 135/49 | HR 80 | Temp 96.8°F | Resp 20 | Ht 71.0 in | Wt 201.0 lb

## 2014-12-03 DIAGNOSIS — C3412 Malignant neoplasm of upper lobe, left bronchus or lung: Secondary | ICD-10-CM

## 2014-12-03 DIAGNOSIS — R5383 Other fatigue: Secondary | ICD-10-CM | POA: Diagnosis not present

## 2014-12-03 DIAGNOSIS — I2510 Atherosclerotic heart disease of native coronary artery without angina pectoris: Secondary | ICD-10-CM | POA: Diagnosis not present

## 2014-12-03 DIAGNOSIS — Z5111 Encounter for antineoplastic chemotherapy: Secondary | ICD-10-CM

## 2014-12-03 DIAGNOSIS — C3492 Malignant neoplasm of unspecified part of left bronchus or lung: Secondary | ICD-10-CM

## 2014-12-03 LAB — CBC WITH DIFFERENTIAL/PLATELET
BASO%: 0.5 % (ref 0.0–2.0)
Basophils Absolute: 0.1 10*3/uL (ref 0.0–0.1)
EOS%: 1.4 % (ref 0.0–7.0)
Eosinophils Absolute: 0.2 10*3/uL (ref 0.0–0.5)
HEMATOCRIT: 28.1 % — AB (ref 38.4–49.9)
HGB: 9.4 g/dL — ABNORMAL LOW (ref 13.0–17.1)
LYMPH#: 1.8 10*3/uL (ref 0.9–3.3)
LYMPH%: 17.4 % (ref 14.0–49.0)
MCH: 31 pg (ref 27.2–33.4)
MCHC: 33.3 g/dL (ref 32.0–36.0)
MCV: 93.2 fL (ref 79.3–98.0)
MONO#: 0.8 10*3/uL (ref 0.1–0.9)
MONO%: 7.1 % (ref 0.0–14.0)
NEUT%: 73.6 % (ref 39.0–75.0)
NEUTROS ABS: 7.7 10*3/uL — AB (ref 1.5–6.5)
PLATELETS: 271 10*3/uL (ref 140–400)
RBC: 3.02 10*6/uL — AB (ref 4.20–5.82)
RDW: 18.6 % — ABNORMAL HIGH (ref 11.0–14.6)
WBC: 10.5 10*3/uL — AB (ref 4.0–10.3)

## 2014-12-03 LAB — COMPREHENSIVE METABOLIC PANEL (CC13)
ALBUMIN: 3.4 g/dL — AB (ref 3.5–5.0)
ALT: 12 U/L (ref 0–55)
AST: 11 U/L (ref 5–34)
Alkaline Phosphatase: 110 U/L (ref 40–150)
Anion Gap: 6 mEq/L (ref 3–11)
BUN: 9.2 mg/dL (ref 7.0–26.0)
CALCIUM: 9.4 mg/dL (ref 8.4–10.4)
CHLORIDE: 109 meq/L (ref 98–109)
CO2: 24 mEq/L (ref 22–29)
CREATININE: 0.9 mg/dL (ref 0.7–1.3)
EGFR: 84 mL/min/{1.73_m2} — ABNORMAL LOW (ref >90)
Glucose: 277 mg/dl — ABNORMAL HIGH (ref 70–140)
Potassium: 4.4 mEq/L (ref 3.5–5.1)
Sodium: 140 mEq/L (ref 136–145)
Total Bilirubin: <0.3 mg/dL (ref 0.20–1.20)
Total Protein: 7.1 g/dL (ref 6.4–8.3)

## 2014-12-03 LAB — MAGNESIUM (CC13): Magnesium: 2 mg/dl (ref 1.5–2.5)

## 2014-12-03 MED ORDER — SODIUM CHLORIDE 0.9 % IV SOLN
533.0000 mg | Freq: Once | INTRAVENOUS | Status: AC
  Administered 2014-12-03: 530 mg via INTRAVENOUS
  Filled 2014-12-03: qty 53

## 2014-12-03 MED ORDER — SODIUM CHLORIDE 0.9 % IV SOLN
Freq: Once | INTRAVENOUS | Status: AC
  Administered 2014-12-03: 10:00:00 via INTRAVENOUS

## 2014-12-03 MED ORDER — SODIUM CHLORIDE 0.9 % IV SOLN
Freq: Once | INTRAVENOUS | Status: AC
  Administered 2014-12-03: 11:00:00 via INTRAVENOUS
  Filled 2014-12-03: qty 5

## 2014-12-03 MED ORDER — SODIUM CHLORIDE 0.9 % IV SOLN
120.0000 mg/m2 | Freq: Once | INTRAVENOUS | Status: AC
  Administered 2014-12-03: 260 mg via INTRAVENOUS
  Filled 2014-12-03: qty 13

## 2014-12-03 NOTE — Patient Instructions (Signed)
[facility] Discharge Instructions for Patients Receiving Chemotherapy  Today you received the following chemotherapy agents Carboplatin and Etoposide.  To help prevent nausea and vomiting after your treatment, we encourage you to take your nausea medication.   If you develop nausea and vomiting that is not controlled by your nausea medication, call the clinic.   BELOW ARE SYMPTOMS THAT SHOULD BE REPORTED IMMEDIATELY:  *FEVER GREATER THAN 100.5 F  *CHILLS WITH OR WITHOUT FEVER  NAUSEA AND VOMITING THAT IS NOT CONTROLLED WITH YOUR NAUSEA MEDICATION  *UNUSUAL SHORTNESS OF BREATH  *UNUSUAL BRUISING OR BLEEDING  TENDERNESS IN MOUTH AND THROAT WITH OR WITHOUT PRESENCE OF ULCERS  *URINARY PROBLEMS  *BOWEL PROBLEMS  UNUSUAL RASH Items with * indicate a potential emergency and should be followed up as soon as possible.  Feel free to call the clinic you have any questions or concerns. The clinic phone number is [phone].  Please show the CHEMO ALERT CARD at check-in to the Emergency Department and triage nurse.   

## 2014-12-03 NOTE — Progress Notes (Signed)
[facility] [phone]   [phone]  OFFICE PROGRESS NOTE  [name], MD [place] [address]  DIAGNOSIS: Limited stage IIIB (T3, N3, M0) small cell lung cancer diagnosed in August 2016.  PRIOR THERAPY: None  CURRENT THERAPY: Systemic chemotherapy was carboplatin for AUC of 5 on day 1 and etoposide 120 MG/M2 on days 1, 2 and 3 with Neulasta support on day 4. Status post 3 cycles.  INTERVAL HISTORY: Karl [name] 79 y.o. male returns to the clinic today for follow-up visit accompanied by his wife. The patient related the last cycle of his chemotherapy fairly well with no significant adverse effects. The patient denied having any significant chest pain, shortness of breath, cough or hemoptysis. He continues to have mild fatigue. He denied having any significant weight loss or night sweats. No current fever or chills, no nausea or vomiting. His pacemaker was removed from the left side of the chest in preparation for radiotherapy. He is here for evaluation with cycle #4.  MEDICAL HISTORY: Past Medical History  Diagnosis Date  . Coronary artery disease     status post CABG by Dr. Redmond Pulling in 1993 and status post percutaneous transluminal coronary angioplasty and stenting by Dr. Wynonia Lawman in 2002 and 2006  . Carotid artery occlusion     severe left internal carotid artery stenosis, asymptomatic status post carotid endarterectomy  . Hypertensive heart disease without CHF   . Carotid artery disease   . Hyperlipidemia   . ICD (implantable cardiac defibrillator) in place   . CHF (congestive heart failure) ([place])   . Anginal pain ([place])   . Pacemaker   . Type 2 diabetes mellitus with vascular disease ([place]) 12/11/2008  . H/O hiatal hernia     "gone after my bypass"  . Arthritis     "neck, back, knees"  . Ischemic cardiomyopathy   . Atrial fibrillation ([place])   . Rheumatoid arthritis(714.0)   . AICD (automatic  cardioverter/defibrillator) present   . 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 Allergy   . Lung cancer ([place]) 09/11/14    eft upper lobe lung  . Myocardial infarction ([place]) ~ 1992    ALLERGIES:  is allergic to amiodarone and rosiglitazone-metformin.  MEDICATIONS:  Current Outpatient Prescriptions  Medication Sig Dispense Refill  . carvedilol (COREG) 3.125 MG tablet Take 6.25 mg by mouth 2 (two) times daily with a meal.     . Cyanocobalamin (VITAMIN B-12) 2500 MCG SUBL Place 2,500 mcg under the tongue daily.     Marland Kitchen dofetilide (TIKOSYN) 250 MCG capsule Take 250 mcg by mouth 2 (two) times daily.    Marland Kitchen HUMALOG KWIKPEN 100 UNIT/ML KiwkPen Inject 4 Units into the skin 3 (three) times daily after meals.   6  . insulin detemir (LEVEMIR) 100 UNIT/ML injection Inject 28 Units into the skin daily.     . nitroGLYCERIN (NITROSTAT) 0.4 MG SL tablet Place 0.4 mg under the tongue every 5 (five) minutes as needed for chest pain.     Marland Kitchen NOVOFINE 32G X 6 MM MISC Inject 1 each into the skin See admin instructions. Uses with insulin  12  . Omega-3 Fatty Acids (FISH OIL) 1000 MG CAPS Take 1,000 mg by mouth 2 (two) times daily.     Marland Kitchen PRADAXA 150 MG CAPS capsule TAKE 1 CAPSULE BY MOUTH TWICE DAILY (Patient taking differently: TAKE 150 MG  BY MOUTH TWICE DAILY) 60 capsule 5  .  simvastatin (ZOCOR) 40 MG tablet Take 40 mg by mouth daily.      No current facility-administered medications for this visit.   Facility-Administered Medications Ordered in Other Visits  Medication Dose Route Frequency Provider Last Rate Last Dose  . LORazepam (ATIVAN) injection 0.5 mg  0.5 mg Intravenous PRN Curt Bears, MD        SURGICAL HISTORY:  Past Surgical History  Procedure Laterality Date  . Carotid endarterectomy  2009    left  . Cardiac defibrillator placement  05/2006    St Jude   . Eye surgery  March 2013    Cataract Left eye  . Eye surgery  06/08/2011    Cataract Right eye  . Pr vein bypass graft,aorto-fem-pop    .  Inguinal hernia repair  1970's    bilaterally  . Hemorrhoid surgery  1970's  . Coronary angioplasty with stent placement      "I've got 2" (08/10/11)  . Cardioversion  08/2010  . Insert / replace / remove pacemaker  05/2006    "got a defibrillator/pacemaker" (08/10/11)  . Coronary artery bypass graft  08/1991    CABG X5  . Cardioversion  08/13/2011    Procedure: CARDIOVERSION;  Surgeon: Jacolyn Reedy, MD;  Location: [facility];  Service: Cardiovascular;  Laterality: N/A;  . Left heart catheterization with coronary/graft angiogram N/A 06/16/2012    Procedure: LEFT HEART CATHETERIZATION WITH Beatrix Fetters;  Surgeon: Jacolyn Reedy, MD;  Location: [facility] CATH LAB;  Service: Cardiovascular;  Laterality: N/A;  . Video bronchoscopy Bilateral 09/11/2014    Procedure: VIDEO BRONCHOSCOPY WITH FLUORO;  Surgeon: Juanito Doom, MD;  Location: [facility];  Service: Cardiopulmonary;  Laterality: Bilateral;  . Cataract surgery Bilateral   . Ep implantable device N/A 11/26/2014    Procedure:  ICD Generator Removal /Can only;  Surgeon: Evans Lance, MD;  Location: [facility] CV LAB;  Service: Cardiovascular;  Laterality: N/A;    REVIEW OF SYSTEMS:  A comprehensive review of systems was negative except for: Constitutional: positive for fatigue   PHYSICAL EXAMINATION: General appearance: alert, cooperative, fatigued and no distress Head: Normocephalic, without obvious abnormality, atraumatic Neck: no adenopathy, no JVD, supple, symmetrical, trachea midline and thyroid not enlarged, symmetric, no tenderness/mass/nodules Lymph nodes: Cervical, supraclavicular, and axillary nodes normal. Resp: clear to auscultation bilaterally Back: symmetric, no curvature. ROM normal. No CVA tenderness. Cardio: regular rate and rhythm, S1, S2 normal, no murmur, click, rub or gallop GI: soft, non-tender; bowel sounds normal; no masses,  no organomegaly Extremities: extremities normal, atraumatic, no cyanosis or  edema  ECOG PERFORMANCE STATUS: 1 - Symptomatic but completely ambulatory  Blood pressure 135/49, pulse 80, temperature 96.8 F (36 C), temperature source Oral, resp. rate 20, height '5\' 11"'$  (1.803 m), weight 201 lb (91.173 kg), SpO2 98 %.  LABORATORY DATA: Lab Results  Component Value Date   WBC 10.5* 12/03/2014   HGB 9.4* 12/03/2014   HCT 28.1* 12/03/2014   MCV 93.2 12/03/2014   PLT 271 12/03/2014      Chemistry      Component Value Date/Time   NA 140 12/03/2014 0855   NA 137 10/14/2014 0504   K 4.4 12/03/2014 0855   K 4.2 10/14/2014 0504   CL 108 10/14/2014 0504   CO2 24 12/03/2014 0855   CO2 23 10/14/2014 0504   BUN 9.2 12/03/2014 0855   BUN 8 10/14/2014 0504   CREATININE 0.9 12/03/2014 0855   CREATININE 0.82 10/14/2014 0504  Component Value Date/Time   CALCIUM 9.4 12/03/2014 0855   CALCIUM 8.5* 10/14/2014 0504   ALKPHOS 110 12/03/2014 0855   ALKPHOS 97 10/12/2014 0353   AST 11 12/03/2014 0855   AST 41 10/12/2014 0353   ALT 12 12/03/2014 0855   ALT 61 10/12/2014 0353   BILITOT <0.30 12/03/2014 0855   BILITOT 0.8 10/12/2014 0353       RADIOGRAPHIC STUDIES: Ct Chest W Contrast  11/09/2014  CLINICAL DATA:  Subsequent encounter for small-cell lung cancer EXAM: CT CHEST WITH CONTRAST TECHNIQUE: Multidetector CT imaging of the chest was performed during intravenous contrast administration. CONTRAST:  76m OMNIPAQUE IOHEXOL 300 MG/ML  SOLN COMPARISON:  PET-CT from 09/28/2014.  Chest CT from 09/07/2014. FINDINGS: Mediastinum / Lymph Nodes: Left-sided permanent pacemaker again noted. There is no axillary lymphadenopathy. Small lymph nodes scattered in the superior mediastinum are again noted. Dominant 16 mm short axis lymph nodes seen in the prevascular space previously has almost completely resolved, measuring only 4 mm in short axis today. 15 mm short axis AP window lymph node seen previously is now 7 mm in short axis. 16 mm short axis precarinal lymph node seen  previously is 14 mm today. Decrease in left hilar nodal tissue noted. Nodal tissue in the right hilum and subcarinal station has associated dystrophic calcification. The heart is enlarged without pericardial effusion. Patient is status post CABG. Lungs / Pleura: The with 6.2 x 5.1 cm left upper lobe mass seen previously has decreased in the interval, measuring 4.0 x 2.0 cm today. Interstitial and airspace disease tracks centrally into the upper left hilum, as before, but the bulk of this disease has also decreased in the interval. There is diffuse interstitial disease in the lungs bilaterally, with chronic features and stable appearance. No new airspace consolidation. No pleural effusion. MSK / Soft Tissues: Bone windows reveal no worrisome lytic or sclerotic osseous lesions. Upper Abdomen:  Unremarkable. IMPRESSION: 1. Substantial interval decrease in the left upper lobe mass and satellite lesions. 2. Interval decrease in mediastinal and left hilar lymphadenopathy. 3. Stable appearance of chronic interstitial lung disease. Electronically Signed   By: EMisty StanleyM.D.   On: 11/09/2014 15:05    ASSESSMENT AND PLAN: This is a very pleasant 79years old white male with limited stage small cell lung cancer currently undergoing systemic chemotherapy with carboplatin and etoposide status post 3 cycles. He tolerated the last cycle of his treatment fairly well. I recommended for the patient to proceed with cycle #4 as a scheduled. He will come back for follow-up visit in 3 weeks for reevaluation after repeating CT scan of the chest, abdomen and pelvis for restaging of his disease. He is expected to start concurrent radiotherapy soon. The patient was advised to call immediately if he has any concerning symptoms in the interval. The patient voices understanding of current disease status and treatment options and is in agreement with the current care plan.  All questions were answered. The patient knows to call the  clinic with any problems, questions or concerns. We can certainly see the patient much sooner if necessary.  Disclaimer: This note was dictated with voice recognition software. Similar sounding words can inadvertently be transcribed and may not be corrected upon review.

## 2014-12-03 NOTE — Telephone Encounter (Signed)
per pof to sch pt appt-adv of CT scna ordered-gave contrast-pt to get updated sch after trmt

## 2014-12-04 ENCOUNTER — Ambulatory Visit (HOSPITAL_BASED_OUTPATIENT_CLINIC_OR_DEPARTMENT_OTHER): Payer: Medicare Other

## 2014-12-04 VITALS — BP 159/55 | HR 71 | Temp 97.0°F | Resp 18

## 2014-12-04 DIAGNOSIS — Z5111 Encounter for antineoplastic chemotherapy: Secondary | ICD-10-CM

## 2014-12-04 DIAGNOSIS — C3492 Malignant neoplasm of unspecified part of left bronchus or lung: Secondary | ICD-10-CM | POA: Diagnosis not present

## 2014-12-04 MED ORDER — SODIUM CHLORIDE 0.9 % IV SOLN
Freq: Once | INTRAVENOUS | Status: AC
  Administered 2014-12-04: 11:00:00 via INTRAVENOUS

## 2014-12-04 MED ORDER — SODIUM CHLORIDE 0.9 % IV SOLN
120.0000 mg/m2 | Freq: Once | INTRAVENOUS | Status: AC
  Administered 2014-12-04: 260 mg via INTRAVENOUS
  Filled 2014-12-04: qty 13

## 2014-12-04 MED ORDER — SODIUM CHLORIDE 0.9 % IV SOLN
Freq: Once | INTRAVENOUS | Status: AC
  Administered 2014-12-04: 11:00:00 via INTRAVENOUS
  Filled 2014-12-04: qty 1

## 2014-12-04 MED ORDER — LORAZEPAM 2 MG/ML IJ SOLN
INTRAMUSCULAR | Status: AC
  Filled 2014-12-04: qty 1

## 2014-12-04 MED ORDER — LORAZEPAM 2 MG/ML IJ SOLN
0.5000 mg | INTRAMUSCULAR | Status: AC | PRN
  Administered 2014-12-04: 0.5 mg via INTRAVENOUS

## 2014-12-04 NOTE — Patient Instructions (Signed)
[facility] Discharge Instructions for Patients Receiving Chemotherapy  Today you received the following chemotherapy agents:  Etoposide  To help prevent nausea and vomiting after your treatment, we encourage you to take your nausea medication as ordered per MD.    If you develop nausea and vomiting that is not controlled by your nausea medication, call the clinic.   BELOW ARE SYMPTOMS THAT SHOULD BE REPORTED IMMEDIATELY:  *FEVER GREATER THAN 100.5 F  *CHILLS WITH OR WITHOUT FEVER  NAUSEA AND VOMITING THAT IS NOT CONTROLLED WITH YOUR NAUSEA MEDICATION  *UNUSUAL SHORTNESS OF BREATH  *UNUSUAL BRUISING OR BLEEDING  TENDERNESS IN MOUTH AND THROAT WITH OR WITHOUT PRESENCE OF ULCERS  *URINARY PROBLEMS  *BOWEL PROBLEMS  UNUSUAL RASH Items with * indicate a potential emergency and should be followed up as soon as possible.  Feel free to call the clinic you have any questions or concerns. The clinic phone number is [phone].  Please show the CHEMO ALERT CARD at check-in to the Emergency Department and triage nurse.   

## 2014-12-04 NOTE — Progress Notes (Signed)
Ok to proceed with Cycle 4 treatment today per Dr. Julien Nordmann note 12/03/2014. Labs were reviewed as well.

## 2014-12-05 ENCOUNTER — Ambulatory Visit (HOSPITAL_BASED_OUTPATIENT_CLINIC_OR_DEPARTMENT_OTHER): Payer: Medicare Other

## 2014-12-05 VITALS — BP 141/62 | HR 64 | Temp 98.0°F | Resp 18

## 2014-12-05 DIAGNOSIS — C3492 Malignant neoplasm of unspecified part of left bronchus or lung: Secondary | ICD-10-CM

## 2014-12-05 DIAGNOSIS — C3412 Malignant neoplasm of upper lobe, left bronchus or lung: Secondary | ICD-10-CM | POA: Diagnosis not present

## 2014-12-05 DIAGNOSIS — Z5111 Encounter for antineoplastic chemotherapy: Secondary | ICD-10-CM

## 2014-12-05 MED ORDER — LORAZEPAM 2 MG/ML IJ SOLN
INTRAMUSCULAR | Status: AC
  Filled 2014-12-05: qty 1

## 2014-12-05 MED ORDER — LORAZEPAM 2 MG/ML IJ SOLN
0.5000 mg | INTRAMUSCULAR | Status: AC | PRN
  Administered 2014-12-05: 0.5 mg via INTRAVENOUS

## 2014-12-05 MED ORDER — SODIUM CHLORIDE 0.9 % IV SOLN
Freq: Once | INTRAVENOUS | Status: AC
  Administered 2014-12-05: 10:00:00 via INTRAVENOUS

## 2014-12-05 MED ORDER — SODIUM CHLORIDE 0.9 % IV SOLN
120.0000 mg/m2 | Freq: Once | INTRAVENOUS | Status: AC
  Administered 2014-12-05: 260 mg via INTRAVENOUS
  Filled 2014-12-05: qty 13

## 2014-12-05 MED ORDER — DEXAMETHASONE SODIUM PHOSPHATE 100 MG/10ML IJ SOLN
Freq: Once | INTRAMUSCULAR | Status: AC
  Administered 2014-12-05: 11:00:00 via INTRAVENOUS
  Filled 2014-12-05: qty 1

## 2014-12-05 NOTE — Patient Instructions (Signed)
[facility] Discharge Instructions for Patients Receiving Chemotherapy  Today you received the following chemotherapy agents: Etoposide   To help prevent nausea and vomiting after your treatment, we encourage you to take your nausea medication as directed.    If you develop nausea and vomiting that is not controlled by your nausea medication, call the clinic.   BELOW ARE SYMPTOMS THAT SHOULD BE REPORTED IMMEDIATELY:  *FEVER GREATER THAN 100.5 F  *CHILLS WITH OR WITHOUT FEVER  NAUSEA AND VOMITING THAT IS NOT CONTROLLED WITH YOUR NAUSEA MEDICATION  *UNUSUAL SHORTNESS OF BREATH  *UNUSUAL BRUISING OR BLEEDING  TENDERNESS IN MOUTH AND THROAT WITH OR WITHOUT PRESENCE OF ULCERS  *URINARY PROBLEMS  *BOWEL PROBLEMS  UNUSUAL RASH Items with * indicate a potential emergency and should be followed up as soon as possible.  Feel free to call the clinic you have any questions or concerns. The clinic phone number is [phone].  Please show the CHEMO ALERT CARD at check-in to the Emergency Department and triage nurse.   

## 2014-12-06 ENCOUNTER — Ambulatory Visit (INDEPENDENT_AMBULATORY_CARE_PROVIDER_SITE_OTHER): Payer: Medicare Other | Admitting: *Deleted

## 2014-12-06 DIAGNOSIS — Z95810 Presence of automatic (implantable) cardiac defibrillator: Secondary | ICD-10-CM

## 2014-12-06 NOTE — Progress Notes (Signed)
Wound check in clinic s/p ICD generator explant 11/26/14. Steri strips removed. Wound well healed without redness, swelling or drainage. Pt instructed about wound care and to call device clinic if redness, swelling or drainage appears around the incision. Pt verbalizes understanding. ROV with Dr. Lovena Le 02/12/15.

## 2014-12-07 ENCOUNTER — Ambulatory Visit (HOSPITAL_BASED_OUTPATIENT_CLINIC_OR_DEPARTMENT_OTHER): Payer: Medicare Other

## 2014-12-07 VITALS — BP 122/43 | HR 64 | Temp 98.0°F

## 2014-12-07 DIAGNOSIS — Z5189 Encounter for other specified aftercare: Secondary | ICD-10-CM | POA: Diagnosis not present

## 2014-12-07 DIAGNOSIS — C3412 Malignant neoplasm of upper lobe, left bronchus or lung: Secondary | ICD-10-CM

## 2014-12-07 DIAGNOSIS — C3492 Malignant neoplasm of unspecified part of left bronchus or lung: Secondary | ICD-10-CM

## 2014-12-07 MED ORDER — PEGFILGRASTIM INJECTION 6 MG/0.6ML ~~LOC~~
6.0000 mg | PREFILLED_SYRINGE | Freq: Once | SUBCUTANEOUS | Status: AC
  Administered 2014-12-07: 6 mg via SUBCUTANEOUS
  Filled 2014-12-07: qty 0.6

## 2014-12-10 ENCOUNTER — Ambulatory Visit: Payer: Medicare Other | Admitting: Radiation Oncology

## 2014-12-10 ENCOUNTER — Other Ambulatory Visit (HOSPITAL_BASED_OUTPATIENT_CLINIC_OR_DEPARTMENT_OTHER): Payer: Medicare Other

## 2014-12-10 ENCOUNTER — Ambulatory Visit
Admission: RE | Admit: 2014-12-10 | Discharge: 2014-12-10 | Disposition: A | Discharge status: Home / Self Care | Payer: Medicare Other | Source: Ambulatory Visit | Attending: Radiation Oncology | Admitting: Radiation Oncology

## 2014-12-10 DIAGNOSIS — C3412 Malignant neoplasm of upper lobe, left bronchus or lung: Secondary | ICD-10-CM

## 2014-12-10 DIAGNOSIS — C3492 Malignant neoplasm of unspecified part of left bronchus or lung: Secondary | ICD-10-CM

## 2014-12-10 LAB — CBC WITH DIFFERENTIAL/PLATELET
BASO%: 0.4 % (ref 0.0–2.0)
Basophils Absolute: 0 10*3/uL (ref 0.0–0.1)
EOS%: 0.4 % (ref 0.0–7.0)
Eosinophils Absolute: 0 10*3/uL (ref 0.0–0.5)
HEMATOCRIT: 25.4 % — AB (ref 38.4–49.9)
HEMOGLOBIN: 8.4 g/dL — AB (ref 13.0–17.1)
LYMPH#: 1.5 10*3/uL (ref 0.9–3.3)
LYMPH%: 12.5 % — ABNORMAL LOW (ref 14.0–49.0)
MCH: 31.2 pg (ref 27.2–33.4)
MCHC: 33.2 g/dL (ref 32.0–36.0)
MCV: 94 fL (ref 79.3–98.0)
MONO#: 0.1 10*3/uL (ref 0.1–0.9)
MONO%: 1.3 % (ref 0.0–14.0)
NEUT#: 10.1 10*3/uL — ABNORMAL HIGH (ref 1.5–6.5)
NEUT%: 85.4 % — ABNORMAL HIGH (ref 39.0–75.0)
Platelets: 177 10*3/uL (ref 140–400)
RBC: 2.7 10*6/uL — ABNORMAL LOW (ref 4.20–5.82)
RDW: 18.9 % — AB (ref 11.0–14.6)
WBC: 11.8 10*3/uL — AB (ref 4.0–10.3)

## 2014-12-10 LAB — COMPREHENSIVE METABOLIC PANEL (CC13)
ALBUMIN: 3.3 g/dL — AB (ref 3.5–5.0)
ALK PHOS: 110 U/L (ref 40–150)
ALT: 17 U/L (ref 0–55)
AST: 11 U/L (ref 5–34)
Anion Gap: 7 mEq/L (ref 3–11)
BUN: 15.3 mg/dL (ref 7.0–26.0)
CALCIUM: 9.2 mg/dL (ref 8.4–10.4)
CHLORIDE: 108 meq/L (ref 98–109)
CO2: 25 mEq/L (ref 22–29)
CREATININE: 1 mg/dL (ref 0.7–1.3)
EGFR: 76 mL/min/{1.73_m2} — ABNORMAL LOW (ref >90)
GLUCOSE: 267 mg/dL — AB (ref 70–140)
Potassium: 4.5 mEq/L (ref 3.5–5.1)
SODIUM: 140 meq/L (ref 136–145)
Total Bilirubin: 0.69 mg/dL (ref 0.20–1.20)
Total Protein: 6.6 g/dL (ref 6.4–8.3)

## 2014-12-10 LAB — MAGNESIUM (CC13): MAGNESIUM: 2 mg/dL (ref 1.5–2.5)

## 2014-12-11 ENCOUNTER — Ambulatory Visit
Admission: RE | Admit: 2014-12-11 | Discharge: 2014-12-11 | Disposition: A | Discharge status: Home / Self Care | Payer: Medicare Other | Source: Ambulatory Visit | Attending: Radiation Oncology | Admitting: Radiation Oncology

## 2014-12-11 ENCOUNTER — Ambulatory Visit: Payer: Medicare Other

## 2014-12-12 ENCOUNTER — Ambulatory Visit: Payer: Medicare Other

## 2014-12-12 ENCOUNTER — Ambulatory Visit
Admission: RE | Admit: 2014-12-12 | Discharge: 2014-12-12 | Disposition: A | Discharge status: Home / Self Care | Payer: Medicare Other | Source: Ambulatory Visit | Attending: Radiation Oncology | Admitting: Radiation Oncology

## 2014-12-12 DIAGNOSIS — C3412 Malignant neoplasm of upper lobe, left bronchus or lung: Secondary | ICD-10-CM | POA: Diagnosis not present

## 2014-12-13 ENCOUNTER — Ambulatory Visit
Admission: RE | Admit: 2014-12-13 | Discharge: 2014-12-13 | Disposition: A | Discharge status: Home / Self Care | Payer: Medicare Other | Source: Ambulatory Visit | Attending: Radiation Oncology | Admitting: Radiation Oncology

## 2014-12-13 DIAGNOSIS — C3492 Malignant neoplasm of unspecified part of left bronchus or lung: Secondary | ICD-10-CM

## 2014-12-13 DIAGNOSIS — C3412 Malignant neoplasm of upper lobe, left bronchus or lung: Secondary | ICD-10-CM | POA: Diagnosis not present

## 2014-12-13 MED ORDER — SONAFINE EX EMUL
1.0000 "application " | Freq: Two times a day (BID) | CUTANEOUS | Status: DC
  Administered 2014-12-13: 1 via TOPICAL
  Filled 2014-12-13: qty 45

## 2014-12-13 NOTE — Progress Notes (Signed)
RADIATION TREATMENT  SKIN CARE    RECOMMENDATIONS: ? Use unscented soap (Dove) ? When showering it is fine for water to touch the area, but please avoid direct spray on the treatment field if skin becomes irritated.  Also, wash inside and around the marked area ? When drying gently blot the area  ? PLEASE DO NOT APPLY ANY OTHER CREAMS, LOTIONS, POWDERS, PERFUMES, OILS OR ALCOHOL PRODUCTS (OTHER THAN WHAT IS GIVEN TO YOU BY THE RADIATION TEAM) TO THE TREATMENT AREA DURING RADIATION THERAPY   SKIN CARE: ? Moisturizer o You will be given Sonafine to use. Apply twice daily, once after treatment and then again prior to bedtime o Your Radiation Oncologist may suggest other skin care products as needed   PLEASE DO NOT APPLY THE RADIAPLEX GEL OR ALRA DEODORANT WITHIN 4 HOURS PRIOR TO RADIATION TREATMENT

## 2014-12-14 ENCOUNTER — Ambulatory Visit
Admission: RE | Admit: 2014-12-14 | Discharge: 2014-12-14 | Disposition: A | Discharge status: Home / Self Care | Payer: Medicare Other | Source: Ambulatory Visit | Attending: Radiation Oncology | Admitting: Radiation Oncology

## 2014-12-14 ENCOUNTER — Encounter: Payer: Self-pay | Admitting: Radiation Oncology

## 2014-12-14 VITALS — BP 142/32 | HR 74 | Temp 97.9°F | Resp 22

## 2014-12-14 DIAGNOSIS — C3412 Malignant neoplasm of upper lobe, left bronchus or lung: Secondary | ICD-10-CM | POA: Diagnosis not present

## 2014-12-14 NOTE — Progress Notes (Signed)
   Department of Radiation Oncology  Phone:  [phone] Fax:        [phone]  Weekly Treatment Note    Name: Karl [name] Date: 12/14/2014 MRN: [id number] DOB: November 28, 1935   Current dose: 4 Gy  Current fraction: 2   MEDICATIONS: Current Outpatient Prescriptions  Medication Sig Dispense Refill  . carvedilol (COREG) 3.125 MG tablet Take 6.25 mg by mouth 2 (two) times daily with a meal.     . Cyanocobalamin (VITAMIN B-12) 2500 MCG SUBL Place 2,500 mcg under the tongue daily.     Marland Kitchen dofetilide (TIKOSYN) 250 MCG capsule Take 250 mcg by mouth 2 (two) times daily.    Marland Kitchen HUMALOG KWIKPEN 100 UNIT/ML KiwkPen Inject 4 Units into the skin 3 (three) times daily after meals.   6  . insulin detemir (LEVEMIR) 100 UNIT/ML injection Inject 28 Units into the skin daily.     . Omega-3 Fatty Acids (FISH OIL) 1000 MG CAPS Take 1,000 mg by mouth 2 (two) times daily.     Marland Kitchen PRADAXA 150 MG CAPS capsule TAKE 1 CAPSULE BY MOUTH TWICE DAILY (Patient taking differently: TAKE 150 MG  BY MOUTH TWICE DAILY) 60 capsule 5  . simvastatin (ZOCOR) 40 MG tablet Take 40 mg by mouth daily.     . nitroGLYCERIN (NITROSTAT) 0.4 MG SL tablet Place 0.4 mg under the tongue every 5 (five) minutes as needed for chest pain.      No current facility-administered medications for this encounter.   Facility-Administered Medications Ordered in Other Encounters  Medication Dose Route Frequency Provider Last Rate Last Dose  . LORazepam (ATIVAN) injection 0.5 mg  0.5 mg Intravenous PRN Curt Bears, MD         ALLERGIES: Amiodarone and Rosiglitazone-metformin   LABORATORY DATA:  Lab Results  Component Value Date   WBC 11.8* 12/10/2014   HGB 8.4* 12/10/2014   HCT 25.4* 12/10/2014   MCV 94.0 12/10/2014   PLT 177 12/10/2014   Lab Results  Component Value Date   NA 140 12/10/2014   K 4.5 12/10/2014   CL 108 10/14/2014   CO2 25 12/10/2014   Lab Results  Component Value Date   ALT 17 12/10/2014   AST 11  12/10/2014   ALKPHOS 110 12/10/2014   BILITOT 0.69 12/10/2014     NARRATIVE: Karl [name] was seen today for weekly treatment management. The chart was checked and the patient's films were reviewed.  Weekly rad txs left lung, 2/30 completed, coughs up greyish phelgm in am,  Has a defibrillator portable , appetite good, no nausea,  No pain, c/o heartburn ,not taking anything or this 5:50 PM BP 142/32 mmHg  Pulse 74  Temp(Src) 97.9 F (36.6 C) (Oral)  Resp 22  SpO2 96%  Wt Readings from Last 3 Encounters:  12/03/14 201 lb (91.173 kg)  11/26/14 200 lb (90.719 kg)  11/12/14 196 lb 8 oz (89.132 kg)    PHYSICAL EXAMINATION: oral temperature is 97.9 F (36.6 C). His blood pressure is 142/32 and his pulse is 74. His respiration is 22 and oxygen saturation is 96%.        ASSESSMENT: The patient is doing satisfactorily with treatment.  PLAN: We will continue with the patient's radiation treatment as planned.

## 2014-12-14 NOTE — Progress Notes (Addendum)
Weekly rad txs left lung, 2/30 completed, coughs up greyish phelgm in am,  Has a defibrillator portable , appetite good, no nausea,  No pain, c/o heartburn ,not taking anything or this 11:16 AM BP 142/32 mmHg  Pulse 74  Temp(Src) 97.9 F (36.6 C) (Oral)  Resp 22  SpO2 96%  Wt Readings from Last 3 Encounters:  12/03/14 201 lb (91.173 kg)  11/26/14 200 lb (90.719 kg)  11/12/14 196 lb 8 oz (89.132 kg)

## 2014-12-17 ENCOUNTER — Ambulatory Visit
Admission: RE | Admit: 2014-12-17 | Discharge: 2014-12-17 | Disposition: A | Discharge status: Home / Self Care | Payer: Medicare Other | Source: Ambulatory Visit | Attending: Radiation Oncology | Admitting: Radiation Oncology

## 2014-12-17 ENCOUNTER — Ambulatory Visit (HOSPITAL_COMMUNITY)
Admission: RE | Admit: 2014-12-17 | Discharge: 2014-12-17 | Disposition: A | Discharge status: Home / Self Care | Payer: Medicare Other | Source: Ambulatory Visit | Attending: Internal Medicine | Admitting: Internal Medicine

## 2014-12-17 ENCOUNTER — Other Ambulatory Visit: Payer: Self-pay | Admitting: Medical Oncology

## 2014-12-17 ENCOUNTER — Other Ambulatory Visit (HOSPITAL_BASED_OUTPATIENT_CLINIC_OR_DEPARTMENT_OTHER): Payer: Medicare Other

## 2014-12-17 DIAGNOSIS — D6489 Other specified anemias: Secondary | ICD-10-CM

## 2014-12-17 DIAGNOSIS — C3492 Malignant neoplasm of unspecified part of left bronchus or lung: Secondary | ICD-10-CM | POA: Insufficient documentation

## 2014-12-17 DIAGNOSIS — C3412 Malignant neoplasm of upper lobe, left bronchus or lung: Secondary | ICD-10-CM | POA: Diagnosis not present

## 2014-12-17 DIAGNOSIS — T451X5A Adverse effect of antineoplastic and immunosuppressive drugs, initial encounter: Principal | ICD-10-CM

## 2014-12-17 DIAGNOSIS — D6481 Anemia due to antineoplastic chemotherapy: Secondary | ICD-10-CM

## 2014-12-17 LAB — CBC WITH DIFFERENTIAL/PLATELET
BASO%: 0.5 % (ref 0.0–2.0)
BASOS ABS: 0.1 10*3/uL (ref 0.0–0.1)
EOS%: 0.7 % (ref 0.0–7.0)
Eosinophils Absolute: 0.1 10*3/uL (ref 0.0–0.5)
HEMATOCRIT: 24 % — AB (ref 38.4–49.9)
HEMOGLOBIN: 7.8 g/dL — AB (ref 13.0–17.1)
LYMPH#: 1.8 10*3/uL (ref 0.9–3.3)
LYMPH%: 17 % (ref 14.0–49.0)
MCH: 31.3 pg (ref 27.2–33.4)
MCHC: 32.7 g/dL (ref 32.0–36.0)
MCV: 95.8 fL (ref 79.3–98.0)
MONO#: 0.9 10*3/uL (ref 0.1–0.9)
MONO%: 8.7 % (ref 0.0–14.0)
NEUT#: 7.7 10*3/uL — ABNORMAL HIGH (ref 1.5–6.5)
NEUT%: 73.1 % (ref 39.0–75.0)
Platelets: 76 10*3/uL — ABNORMAL LOW (ref 140–400)
RBC: 2.5 10*6/uL — ABNORMAL LOW (ref 4.20–5.82)
RDW: 19.3 % — AB (ref 11.0–14.6)
WBC: 10.6 10*3/uL — ABNORMAL HIGH (ref 4.0–10.3)

## 2014-12-17 LAB — COMPREHENSIVE METABOLIC PANEL (CC13)
ALBUMIN: 3.3 g/dL — AB (ref 3.5–5.0)
ALK PHOS: 116 U/L (ref 40–150)
ALT: 13 U/L (ref 0–55)
AST: 11 U/L (ref 5–34)
Anion Gap: 7 mEq/L (ref 3–11)
BILIRUBIN TOTAL: 0.33 mg/dL (ref 0.20–1.20)
BUN: 9.5 mg/dL (ref 7.0–26.0)
CO2: 27 mEq/L (ref 22–29)
CREATININE: 1 mg/dL (ref 0.7–1.3)
Calcium: 9.3 mg/dL (ref 8.4–10.4)
Chloride: 108 mEq/L (ref 98–109)
EGFR: 74 mL/min/{1.73_m2} — AB (ref >90)
GLUCOSE: 157 mg/dL — AB (ref 70–140)
Potassium: 4.4 mEq/L (ref 3.5–5.1)
SODIUM: 142 meq/L (ref 136–145)
TOTAL PROTEIN: 6.7 g/dL (ref 6.4–8.3)

## 2014-12-17 LAB — MAGNESIUM (CC13): Magnesium: 2 mg/dl (ref 1.5–2.5)

## 2014-12-17 NOTE — Progress Notes (Signed)
har called for 

## 2014-12-17 NOTE — Progress Notes (Signed)
Quick Note:  Call patient with the result and arrange for 2 units of PRBCs transfusion. ______ 

## 2014-12-18 ENCOUNTER — Ambulatory Visit
Admission: RE | Admit: 2014-12-18 | Discharge: 2014-12-18 | Disposition: A | Discharge status: Home / Self Care | Payer: Medicare Other | Source: Ambulatory Visit | Attending: Radiation Oncology | Admitting: Radiation Oncology

## 2014-12-18 DIAGNOSIS — C3412 Malignant neoplasm of upper lobe, left bronchus or lung: Secondary | ICD-10-CM | POA: Diagnosis not present

## 2014-12-19 ENCOUNTER — Ambulatory Visit
Admission: RE | Admit: 2014-12-19 | Discharge: 2014-12-19 | Disposition: A | Discharge status: Home / Self Care | Payer: Medicare Other | Source: Ambulatory Visit | Attending: Radiation Oncology | Admitting: Radiation Oncology

## 2014-12-19 DIAGNOSIS — C3412 Malignant neoplasm of upper lobe, left bronchus or lung: Secondary | ICD-10-CM | POA: Diagnosis not present

## 2014-12-20 ENCOUNTER — Ambulatory Visit
Admission: RE | Admit: 2014-12-20 | Discharge: 2014-12-20 | Disposition: A | Discharge status: Home / Self Care | Payer: Medicare Other | Source: Ambulatory Visit | Attending: Radiation Oncology | Admitting: Radiation Oncology

## 2014-12-20 DIAGNOSIS — C3412 Malignant neoplasm of upper lobe, left bronchus or lung: Secondary | ICD-10-CM | POA: Diagnosis not present

## 2014-12-21 ENCOUNTER — Ambulatory Visit
Admission: RE | Admit: 2014-12-21 | Discharge: 2014-12-21 | Disposition: A | Discharge status: Home / Self Care | Payer: Medicare Other | Source: Ambulatory Visit | Attending: Radiation Oncology | Admitting: Radiation Oncology

## 2014-12-21 ENCOUNTER — Ambulatory Visit (HOSPITAL_COMMUNITY)
Admission: RE | Admit: 2014-12-21 | Discharge: 2014-12-21 | Disposition: A | Discharge status: Home / Self Care | Payer: Medicare Other | Source: Ambulatory Visit | Attending: Internal Medicine | Admitting: Internal Medicine

## 2014-12-21 ENCOUNTER — Encounter: Payer: Self-pay | Admitting: Radiation Oncology

## 2014-12-21 VITALS — BP 142/59 | HR 79 | Temp 97.8°F | Ht 71.0 in | Wt 200.7 lb

## 2014-12-21 DIAGNOSIS — N289 Disorder of kidney and ureter, unspecified: Secondary | ICD-10-CM | POA: Insufficient documentation

## 2014-12-21 DIAGNOSIS — K4020 Bilateral inguinal hernia, without obstruction or gangrene, not specified as recurrent: Secondary | ICD-10-CM | POA: Insufficient documentation

## 2014-12-21 DIAGNOSIS — C3412 Malignant neoplasm of upper lobe, left bronchus or lung: Secondary | ICD-10-CM | POA: Diagnosis not present

## 2014-12-21 DIAGNOSIS — I700 Atherosclerosis of aorta: Secondary | ICD-10-CM | POA: Insufficient documentation

## 2014-12-21 DIAGNOSIS — Z951 Presence of aortocoronary bypass graft: Secondary | ICD-10-CM | POA: Diagnosis not present

## 2014-12-21 DIAGNOSIS — I517 Cardiomegaly: Secondary | ICD-10-CM | POA: Insufficient documentation

## 2014-12-21 DIAGNOSIS — J849 Interstitial pulmonary disease, unspecified: Secondary | ICD-10-CM | POA: Insufficient documentation

## 2014-12-21 DIAGNOSIS — Z950 Presence of cardiac pacemaker: Secondary | ICD-10-CM | POA: Insufficient documentation

## 2014-12-21 DIAGNOSIS — R590 Localized enlarged lymph nodes: Secondary | ICD-10-CM | POA: Insufficient documentation

## 2014-12-21 DIAGNOSIS — C3492 Malignant neoplasm of unspecified part of left bronchus or lung: Secondary | ICD-10-CM

## 2014-12-21 DIAGNOSIS — J90 Pleural effusion, not elsewhere classified: Secondary | ICD-10-CM | POA: Insufficient documentation

## 2014-12-21 MED ORDER — IOHEXOL 300 MG/ML  SOLN
100.0000 mL | Freq: Once | INTRAMUSCULAR | Status: AC | PRN
  Administered 2014-12-21: 100 mL via INTRAVENOUS

## 2014-12-21 NOTE — Progress Notes (Signed)
Department of Radiation Oncology  Phone:  [phone] Fax:        [phone]  Weekly Treatment Note    Name: Karl [name] Date: 12/21/2014 MRN: [id number] DOB: 11-22-1935   Current dose: 14 Gy  Current fraction: 7   MEDICATIONS: Current Outpatient Prescriptions  Medication Sig Dispense Refill  . carvedilol (COREG) 3.125 MG tablet Take 6.25 mg by mouth 2 (two) times daily with a meal.     . Cyanocobalamin (VITAMIN B-12) 2500 MCG SUBL Place 2,500 mcg under the tongue daily.     Marland Kitchen dofetilide (TIKOSYN) 250 MCG capsule Take 250 mcg by mouth 2 (two) times daily.    Marland Kitchen HUMALOG KWIKPEN 100 UNIT/ML KiwkPen Inject 4 Units into the skin 3 (three) times daily after meals.   6  . nitroGLYCERIN (NITROSTAT) 0.4 MG SL tablet Place 0.4 mg under the tongue every 5 (five) minutes as needed for chest pain.     . Omega-3 Fatty Acids (FISH OIL) 1000 MG CAPS Take 1,000 mg by mouth 2 (two) times daily.     Marland Kitchen PRADAXA 150 MG CAPS capsule TAKE 1 CAPSULE BY MOUTH TWICE DAILY (Patient taking differently: TAKE 150 MG  BY MOUTH TWICE DAILY) 60 capsule 5  . simvastatin (ZOCOR) 40 MG tablet Take 40 mg by mouth daily.     . insulin detemir (LEVEMIR) 100 UNIT/ML injection Inject 28 Units into the skin daily.      No current facility-administered medications for this encounter.   Facility-Administered Medications Ordered in Other Encounters  Medication Dose Route Frequency Provider Last Rate Last Dose  . LORazepam (ATIVAN) injection 0.5 mg  0.5 mg Intravenous PRN Curt Bears, MD         ALLERGIES: Amiodarone and Rosiglitazone-metformin   LABORATORY DATA:  Lab Results  Component Value Date   WBC 10.6* 12/17/2014   HGB 7.8* 12/17/2014   HCT 24.0* 12/17/2014   MCV 95.8 12/17/2014   PLT 76* 12/17/2014   Lab Results  Component Value Date   NA 142 12/17/2014   K 4.4 12/17/2014   CL 108 10/14/2014   CO2 27 12/17/2014   Lab Results  Component Value Date   ALT 13 12/17/2014   AST 11  12/17/2014   ALKPHOS 116 12/17/2014   BILITOT 0.33 12/17/2014     NARRATIVE: Karl [name] was seen today for weekly treatment management. The chart was checked and the patient's films were reviewed.  Weekly rad txs left lung, 7/30 completed. He has shortness of breath with ambulation. He is coughing and feels that it is a little worse and is bothersome at night. He is using cough medicine to relieve his cough and is sleeping with two pillows under his head. His energy level is very low which makes him just want to sleep.  12:38 PM BP 142/59 mmHg  Pulse 79  Temp(Src) 97.8 F (36.6 C) (Oral)  Ht '5\' 11"'$  (1.803 m)  Wt 200 lb 11.2 oz (91.037 kg)  BMI 28.00 kg/m2  SpO2 96%  Wt Readings from Last 3 Encounters:  12/03/14 201 lb (91.173 kg)  11/26/14 200 lb (90.719 kg)  11/12/14 196 lb 8 oz (89.132 kg)    PHYSICAL EXAMINATION: height is '5\' 11"'$  (1.803 m) and weight is 200 lb 11.2 oz (91.037 kg). His oral temperature is 97.8 F (36.6 C). His blood pressure is 142/59 and his pulse is 79. His oxygen saturation is 96%.        ASSESSMENT: The patient is doing satisfactorily  with treatment.  PLAN: We will continue with the patient's radiation treatment as planned.        This document serves as a record of services personally performed by Kyung Rudd, MD. It was created on his behalf by  Lendon Collar, a trained medical scribe. The creation of this record is based on the scribe's personal observations and the provider's statements to them. This document has been checked and approved by the attending provider.

## 2014-12-21 NOTE — Progress Notes (Signed)
Mr. Tuzzolino has received 7 fractions.  Skin has slight hyerpigmentation to chest wall at radiation site.  Has SOB with ambulation at any times he reports o2 sat is 96 % today.   He is coughing and feels that his cough is about the same it is bothersome at night.  Energy level is very low just wants to sleep.

## 2014-12-21 NOTE — Progress Notes (Signed)
  Radiation Oncology         [phone] ________________________________  Name: Karl [name] MRN: [id number]  Date: 11/30/2014  DOB: 1935/07/10  SIMULATION AND TREATMENT PLANNING NOTE  DIAGNOSIS:  Small cell lung cancer  NARRATIVE:  The patient was brought to the [facility] suite.  Identity was confirmed.  All relevant records and images related to the planned course of therapy were reviewed.   Written consent to proceed with treatment was confirmed which was freely given after reviewing the details related to the planned course of therapy had been reviewed with the patient.  Then, the patient was set-up in a stable reproducible  supine position for radiation therapy.  CT images were obtained.  Surface markings were placed.    Medically necessary complex treatment device(s) for immobilization:  Customized vac lock bag.   The CT images were loaded into the planning software.  Then the target and avoidance structures were contoured.  Treatment planning then occurred.  The radiation prescription was entered and confirmed.  I have requested : Intensity Modulated Radiotherapy (IMRT) is medically necessary for this case for the following reason:  Adequate sparing of adjacent critical normal structures including the lungs, spinal cord.  The patient will undergo daily image guidance to ensure accurate localization of the target, and adequate minimize dose to the normal surrounding structures in close proximity to the target.    PLAN:  The patient will receive 60 Gy in 30 fractions.   Special treatment procedure The patient will also receive concurrent chemotherapy during the treatment. The patient may therefore experience increased toxicity or side effects and the patient will be monitored for such problems. This may require extra lab work as necessary. This therefore constitutes a special treatment procedure.  ________________________________   Jodelle Gross, MD, PhD

## 2014-12-23 ENCOUNTER — Ambulatory Visit
Admission: RE | Admit: 2014-12-23 | Discharge: 2014-12-23 | Disposition: A | Discharge status: Home / Self Care | Payer: Medicare Other | Source: Ambulatory Visit | Attending: Radiation Oncology | Admitting: Radiation Oncology

## 2014-12-23 DIAGNOSIS — C3412 Malignant neoplasm of upper lobe, left bronchus or lung: Secondary | ICD-10-CM | POA: Diagnosis not present

## 2014-12-24 ENCOUNTER — Ambulatory Visit (HOSPITAL_BASED_OUTPATIENT_CLINIC_OR_DEPARTMENT_OTHER): Payer: Medicare Other | Admitting: Internal Medicine

## 2014-12-24 ENCOUNTER — Telehealth: Payer: Self-pay | Admitting: Internal Medicine

## 2014-12-24 ENCOUNTER — Encounter: Payer: Self-pay | Admitting: Internal Medicine

## 2014-12-24 ENCOUNTER — Ambulatory Visit (HOSPITAL_BASED_OUTPATIENT_CLINIC_OR_DEPARTMENT_OTHER): Payer: Medicare Other

## 2014-12-24 ENCOUNTER — Other Ambulatory Visit: Payer: Self-pay | Admitting: *Deleted

## 2014-12-24 ENCOUNTER — Other Ambulatory Visit (HOSPITAL_BASED_OUTPATIENT_CLINIC_OR_DEPARTMENT_OTHER): Payer: Medicare Other

## 2014-12-24 ENCOUNTER — Ambulatory Visit
Admission: RE | Admit: 2014-12-24 | Discharge: 2014-12-24 | Disposition: A | Discharge status: Home / Self Care | Payer: Medicare Other | Source: Ambulatory Visit | Attending: Radiation Oncology | Admitting: Radiation Oncology

## 2014-12-24 VITALS — BP 105/55 | HR 79 | Temp 97.6°F | Resp 18 | Ht 71.0 in | Wt 200.1 lb

## 2014-12-24 DIAGNOSIS — D61818 Other pancytopenia: Secondary | ICD-10-CM

## 2014-12-24 DIAGNOSIS — C3412 Malignant neoplasm of upper lobe, left bronchus or lung: Secondary | ICD-10-CM | POA: Diagnosis not present

## 2014-12-24 DIAGNOSIS — C3492 Malignant neoplasm of unspecified part of left bronchus or lung: Secondary | ICD-10-CM | POA: Diagnosis not present

## 2014-12-24 DIAGNOSIS — D6481 Anemia due to antineoplastic chemotherapy: Secondary | ICD-10-CM

## 2014-12-24 DIAGNOSIS — Z5111 Encounter for antineoplastic chemotherapy: Secondary | ICD-10-CM

## 2014-12-24 DIAGNOSIS — T451X5A Adverse effect of antineoplastic and immunosuppressive drugs, initial encounter: Secondary | ICD-10-CM

## 2014-12-24 DIAGNOSIS — D6489 Other specified anemias: Secondary | ICD-10-CM | POA: Diagnosis not present

## 2014-12-24 HISTORY — DX: Adverse effect of antineoplastic and immunosuppressive drugs, initial encounter: D64.81

## 2014-12-24 HISTORY — DX: Anemia due to antineoplastic chemotherapy: T45.1X5A

## 2014-12-24 LAB — CBC WITH DIFFERENTIAL/PLATELET
BASO%: 0.4 % (ref 0.0–2.0)
Basophils Absolute: 0 10*3/uL (ref 0.0–0.1)
EOS ABS: 0.1 10*3/uL (ref 0.0–0.5)
EOS%: 0.8 % (ref 0.0–7.0)
HCT: 25.8 % — ABNORMAL LOW (ref 38.4–49.9)
HGB: 8.4 g/dL — ABNORMAL LOW (ref 13.0–17.1)
LYMPH%: 10.7 % — AB (ref 14.0–49.0)
MCH: 31.2 pg (ref 27.2–33.4)
MCHC: 32.5 g/dL (ref 32.0–36.0)
MCV: 96.1 fL (ref 79.3–98.0)
MONO#: 0.7 10*3/uL (ref 0.1–0.9)
MONO%: 7 % (ref 0.0–14.0)
NEUT#: 7.8 10*3/uL — ABNORMAL HIGH (ref 1.5–6.5)
NEUT%: 81.1 % — AB (ref 39.0–75.0)
PLATELETS: 294 10*3/uL (ref 140–400)
RBC: 2.68 10*6/uL — AB (ref 4.20–5.82)
RDW: 19.3 % — ABNORMAL HIGH (ref 11.0–14.6)
WBC: 9.7 10*3/uL (ref 4.0–10.3)
lymph#: 1 10*3/uL (ref 0.9–3.3)

## 2014-12-24 LAB — COMPREHENSIVE METABOLIC PANEL (CC13)
ALT: 9 U/L (ref 0–55)
ANION GAP: 8 meq/L (ref 3–11)
AST: 13 U/L (ref 5–34)
Albumin: 3.3 g/dL — ABNORMAL LOW (ref 3.5–5.0)
Alkaline Phosphatase: 105 U/L (ref 40–150)
BUN: 10.4 mg/dL (ref 7.0–26.0)
CALCIUM: 9.1 mg/dL (ref 8.4–10.4)
CHLORIDE: 109 meq/L (ref 98–109)
CO2: 23 mEq/L (ref 22–29)
CREATININE: 1.1 mg/dL (ref 0.7–1.3)
EGFR: 68 mL/min/{1.73_m2} — AB (ref >90)
Glucose: 118 mg/dl (ref 70–140)
POTASSIUM: 4.3 meq/L (ref 3.5–5.1)
Sodium: 139 mEq/L (ref 136–145)
Total Bilirubin: 0.38 mg/dL (ref 0.20–1.20)
Total Protein: 6.9 g/dL (ref 6.4–8.3)

## 2014-12-24 LAB — MAGNESIUM (CC13): Magnesium: 2 mg/dl (ref 1.5–2.5)

## 2014-12-24 LAB — ABO/RH: ABO/RH(D): A POS

## 2014-12-24 LAB — PREPARE RBC (CROSSMATCH)

## 2014-12-24 MED ORDER — SODIUM CHLORIDE 0.9 % IV SOLN
533.0000 mg | Freq: Once | INTRAVENOUS | Status: AC
  Administered 2014-12-24: 530 mg via INTRAVENOUS
  Filled 2014-12-24: qty 53

## 2014-12-24 MED ORDER — SODIUM CHLORIDE 0.9 % IV SOLN
Freq: Once | INTRAVENOUS | Status: AC
  Administered 2014-12-24: 13:00:00 via INTRAVENOUS

## 2014-12-24 MED ORDER — SODIUM CHLORIDE 0.9 % IV SOLN
Freq: Once | INTRAVENOUS | Status: AC
  Administered 2014-12-24: 14:00:00 via INTRAVENOUS
  Filled 2014-12-24: qty 5

## 2014-12-24 MED ORDER — SODIUM CHLORIDE 0.9 % IV SOLN
120.0000 mg/m2 | Freq: Once | INTRAVENOUS | Status: AC
  Administered 2014-12-24: 260 mg via INTRAVENOUS
  Filled 2014-12-24: qty 13

## 2014-12-24 NOTE — Progress Notes (Signed)
[facility] Telephone:([phone]   [phone]  OFFICE PROGRESS NOTE  [name], MD [place] [address]  DIAGNOSIS: Limited stage IIIB (T3, N3, M0) small cell lung cancer diagnosed in August 2016.  PRIOR THERAPY: None  CURRENT THERAPY: Systemic chemotherapy was carboplatin for AUC of 5 on day 1 and etoposide 120 MG/M2 on days 1, 2 and 3 with Neulasta support on day 4. Status post 4 cycles. No concurrent with radiotherapy under the care of Dr. Lisbeth Renshaw.  INTERVAL HISTORY: Karl [name] 79 y.o. male returns to the clinic today for follow-up visit accompanied by his wife. The patient tolerated the last cycle of his chemotherapy fairly well with no significant adverse effects except for fatigue. He also started concurrent radiotherapy under the care of Dr. Lisbeth Renshaw. He is complaining of mild sore throat and odynophagia. The patient denied having any significant chest pain, shortness of breath, cough or hemoptysis. He denied having any significant weight loss or night sweats. No current fever or chills, no nausea or vomiting. He had repeat CT scan of the chest performed recently and he is here for evaluation and discussion of his scan results.  MEDICAL HISTORY: Past Medical History  Diagnosis Date  . Coronary artery disease     status post CABG by Dr. Redmond Pulling in 1993 and status post percutaneous transluminal coronary angioplasty and stenting by Dr. Wynonia Lawman in 2002 and 2006  . Carotid artery occlusion     severe left internal carotid artery stenosis, asymptomatic status post carotid endarterectomy  . Hypertensive heart disease without CHF   . Carotid artery disease   . Hyperlipidemia   . ICD (implantable cardiac defibrillator) in place   . CHF (congestive heart failure) ([place])   . Anginal pain ([place])   . Pacemaker   . Type 2 diabetes mellitus with vascular disease ([place]) 12/11/2008  . H/O hiatal hernia     "gone after my bypass"  . Arthritis    "neck, back, knees"  . Ischemic cardiomyopathy   . Atrial fibrillation ([place])   . Rheumatoid arthritis(714.0)   . AICD (automatic cardioverter/defibrillator) present   . 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 Allergy   . Lung cancer ([place] [facility]) 09/11/14    eft upper lobe lung  . Myocardial infarction ([place]) ~ 1992    ALLERGIES:  is allergic to amiodarone and rosiglitazone-metformin.  MEDICATIONS:  Current Outpatient Prescriptions  Medication Sig Dispense Refill  . carvedilol (COREG) 3.125 MG tablet Take 6.25 mg by mouth 2 (two) times daily with a meal.     . Cyanocobalamin (VITAMIN B-12) 2500 MCG SUBL Place 2,500 mcg under the tongue daily.     Marland Kitchen dofetilide (TIKOSYN) 250 MCG capsule Take 250 mcg by mouth 2 (two) times daily.    Marland Kitchen HUMALOG KWIKPEN 100 UNIT/ML KiwkPen Inject 4 Units into the skin 3 (three) times daily after meals.   6  . insulin detemir (LEVEMIR) 100 UNIT/ML injection Inject 28 Units into the skin daily.     . nitroGLYCERIN (NITROSTAT) 0.4 MG SL tablet Place 0.4 mg under the tongue every 5 (five) minutes as needed for chest pain.     . Omega-3 Fatty Acids (FISH OIL) 1000 MG CAPS Take 1,000 mg by mouth 2 (two) times daily.     Marland Kitchen PRADAXA 150 MG CAPS capsule TAKE 1 CAPSULE BY MOUTH TWICE DAILY (Patient taking differently: TAKE 150 MG  BY MOUTH TWICE DAILY) 60 capsule 5  .  simvastatin (ZOCOR) 40 MG tablet Take 40 mg by mouth daily.      No current facility-administered medications for this visit.   Facility-Administered Medications Ordered in Other Visits  Medication Dose Route Frequency Provider Last Rate Last Dose  . LORazepam (ATIVAN) injection 0.5 mg  0.5 mg Intravenous PRN Curt Bears, MD        SURGICAL HISTORY:  Past Surgical History  Procedure Laterality Date  . Carotid endarterectomy  2009    left  . Cardiac defibrillator placement  05/2006    [employer] Jude   . Eye surgery  March 2013    Cataract Left eye  . Eye surgery  06/08/2011    Cataract Right eye  . Pr vein  bypass graft,aorto-fem-pop    . Inguinal hernia repair  1970's    bilaterally  . Hemorrhoid surgery  1970's  . Coronary angioplasty with stent placement      "I've got 2" (08/10/11)  . Cardioversion  08/2010  . Insert / replace / remove pacemaker  05/2006    "got a defibrillator/pacemaker" (08/10/11)  . Coronary artery bypass graft  08/1991    CABG X5  . Cardioversion  08/13/2011    Procedure: CARDIOVERSION;  Surgeon: Jacolyn Reedy, MD;  Location: [facility];  Service: Cardiovascular;  Laterality: N/A;  . Left heart catheterization with coronary/graft angiogram N/A 06/16/2012    Procedure: LEFT HEART CATHETERIZATION WITH Beatrix Fetters;  Surgeon: Jacolyn Reedy, MD;  Location: [facility] C[facility] LAB;  Service: Cardiovascular;  Laterality: N/A;  . Video bronchoscopy Bilateral 09/11/2014    Procedure: VIDEO BRONCHOSCOPY WITH FLUORO;  Surgeon: Juanito Doom, MD;  Location: [facility];  Service: Cardiopulmonary;  Laterality: Bilateral;  . Cataract surgery Bilateral   . Ep implantable device N/A 11/26/2014    Procedure:  ICD Generator Removal /Can only;  Surgeon: Evans Lance, MD;  Location: [facility] CV LAB;  Service: Cardiovascular;  Laterality: N/A;    REVIEW OF SYSTEMS:  Constitutional: positive for fatigue Eyes: negative Ears, nose, mouth, throat, and face: negative Respiratory: positive for dyspnea on exertion Cardiovascular: negative Gastrointestinal: negative Genitourinary:negative Integument/breast: negative Hematologic/lymphatic: negative Musculoskeletal:negative Neurological: negative Behavioral/Psych: negative Endocrine: negative Allergic/Immunologic: negative   PHYSICAL EXAMINATION: General appearance: alert, cooperative, fatigued and no distress Head: Normocephalic, without obvious abnormality, atraumatic Neck: no adenopathy, no JVD, supple, symmetrical, trachea midline and thyroid not enlarged, symmetric, no tenderness/mass/nodules Lymph nodes: Cervical,  supraclavicular, and axillary nodes normal. Resp: clear to auscultation bilaterally Back: symmetric, no curvature. ROM normal. No CVA tenderness. Cardio: regular rate and rhythm, S1, S2 normal, no murmur, click, rub or gallop GI: soft, non-tender; bowel sounds normal; no masses,  no organomegaly Extremities: extremities normal, atraumatic, no cyanosis or edema Neurologic: Alert and oriented X 3, normal strength and tone. Normal symmetric reflexes. Normal coordination and gait  ECOG PERFORMANCE STATUS: 1 - Symptomatic but completely ambulatory  Blood pressure 105/55, pulse 79, temperature 97.6 F (36.4 C), temperature source Oral, resp. rate 18, height '5\' 11"'$  (1.803 m), weight 200 lb 1.6 oz (90.765 kg), SpO2 98 %.  LABORATORY DATA: Lab Results  Component Value Date   WBC 9.7 12/24/2014   HGB 8.4* 12/24/2014   HCT 25.8* 12/24/2014   MCV 96.1 12/24/2014   PLT 294 12/24/2014      Chemistry      Component Value Date/Time   NA 142 12/17/2014 1034   NA 137 10/14/2014 0504   K 4.4 12/17/2014 1034   K 4.2 10/14/2014 0504   CL  108 10/14/2014 0504   CO2 27 12/17/2014 1034   CO2 23 10/14/2014 0504   BUN 9.5 12/17/2014 1034   BUN 8 10/14/2014 0504   CREATININE 1.0 12/17/2014 1034   CREATININE 0.82 10/14/2014 0504      Component Value Date/Time   CALCIUM 9.3 12/17/2014 1034   CALCIUM 8.5* 10/14/2014 0504   ALKPHOS 116 12/17/2014 1034   ALKPHOS 97 10/12/2014 0353   AST 11 12/17/2014 1034   AST 41 10/12/2014 0353   ALT 13 12/17/2014 1034   ALT 61 10/12/2014 0353   BILITOT 0.33 12/17/2014 1034   BILITOT 0.8 10/12/2014 0353       RADIOGRAPHIC STUDIES: Ct Chest W Contrast  12/21/2014  CLINICAL DATA:  Subsequent encounter for small-cell lung cancer. EXAM: CT CHEST, ABDOMEN, AND PELVIS WITH CONTRAST TECHNIQUE: Multidetector CT imaging of the chest, abdomen and pelvis was performed following the standard protocol during bolus administration of intravenous contrast. CONTRAST:  17m  OMNIPAQUE IOHEXOL 300 MG/ML  SOLN COMPARISON:  Chest CT from 11/09/2014.  PET-CT from 09/28/2014. FINDINGS: The power injector rate limited for this study resulting in delayed imaging after IV contrast administration. CT CHEST FINDINGS Mediastinum/Nodes: There is no axillary lymphadenopathy. Scattered mediastinal lymph nodes are again noted. The precarinal lymph node measured previously at 14 mm short axis is stable a 14 mm today. A tiny AP window lymph node measured previously at 7 mm short axis is 6 mm short axis today. Small lymph nodes in the right hilum are stable. Heart size is mildly enlarged. Patient is status post CABG. No pericardial effusion. The esophagus has normal imaging features. Left-sided permanent pacemaker again noted. Lungs/Pleura: Peripheral left upper lobe pulmonary mass shows further decrease in size, measuring 1.8 x 3.6 cm today compared to 2.0 x 4.0 cm previously. The adjacent interstitial and alveolar opacity tracking centrally towards the hilum and extending anteromedially in the left lung is presumably related to a combination of treated disease and radiation change. Scattered chronic diffuse interstitial lung disease with a peripheral predominance is again noted. Small bilateral pleural effusions are new in the interval. Musculoskeletal: Bone windows reveal no worrisome lytic or sclerotic osseous lesions. CT ABDOMEN PELVIS FINDINGS Hepatobiliary: No focal abnormality within the liver parenchyma. There is no evidence for gallstones, gallbladder wall thickening, or pericholecystic fluid. No intrahepatic or extrahepatic biliary dilation. Pancreas: No focal mass lesion. No dilatation of the main duct. No intraparenchymal cyst. No peripancreatic edema. Spleen: No splenomegaly. No focal mass lesion. Adrenals/Urinary Tract: No adrenal nodule or mass. 10 mm exophytic lesion in the interpolar right kidney is unchanged and is likely a cyst. Scarring is evident in the upper pole of the left  kidney. No evidence for hydroureter. The urinary bladder appears normal for the degree of distention. Stomach/Bowel: Stomach is nondistended. No gastric wall thickening. No evidence of outlet obstruction. Duodenum is normally positioned as is the ligament of Treitz. No small bowel wall thickening. No small bowel dilatation. The terminal ileum is normal. The appendix is normal. Diverticuli are seen scattered along the entire length of the colon without CT findings of diverticulitis. Vascular/Lymphatic: There is abdominal aortic atherosclerosis without aneurysm. There is no gastrohepatic or hepatoduodenal ligament lymphadenopathy. No intraperitoneal or retroperitoneal lymphadenopy. No pelvic sidewall lymphadenopathy. Reproductive: The prostate gland and seminal vesicles have normal imaging features. Other: No intraperitoneal free fluid. Musculoskeletal: Bilateral inguinal hernias contain only fat Bone windows reveal no worrisome lytic or sclerotic osseous lesions. IMPRESSION: 1. Interval decrease in size of the dominant peripheral left  upper lobe pulmonary mass with persistent interstitial and airspace disease medially in the left lung. 2. Interval development of small bilateral pleural effusions. 3. Stable appearance of scattered borderline enlarged mediastinal lymph nodes. 4. No evidence for metastatic disease in the abdomen or pelvis. Imaging findings of potential clinical significance: Abdominal aortic atherosclerosis. Electronically Signed   By: Misty Stanley M.D.   On: 12/21/2014 13:45   Ct Abdomen Pelvis W Contrast  12/21/2014  CLINICAL DATA:  Subsequent encounter for small-cell lung cancer. EXAM: CT CHEST, ABDOMEN, AND PELVIS WITH CONTRAST TECHNIQUE: Multidetector CT imaging of the chest, abdomen and pelvis was performed following the standard protocol during bolus administration of intravenous contrast. CONTRAST:  165m OMNIPAQUE IOHEXOL 300 MG/ML  SOLN COMPARISON:  Chest CT from 11/09/2014.  PET-CT from  09/28/2014. FINDINGS: The power injector rate limited for this study resulting in delayed imaging after IV contrast administration. CT CHEST FINDINGS Mediastinum/Nodes: There is no axillary lymphadenopathy. Scattered mediastinal lymph nodes are again noted. The precarinal lymph node measured previously at 14 mm short axis is stable a 14 mm today. A tiny AP window lymph node measured previously at 7 mm short axis is 6 mm short axis today. Small lymph nodes in the right hilum are stable. Heart size is mildly enlarged. Patient is status post CABG. No pericardial effusion. The esophagus has normal imaging features. Left-sided permanent pacemaker again noted. Lungs/Pleura: Peripheral left upper lobe pulmonary mass shows further decrease in size, measuring 1.8 x 3.6 cm today compared to 2.0 x 4.0 cm previously. The adjacent interstitial and alveolar opacity tracking centrally towards the hilum and extending anteromedially in the left lung is presumably related to a combination of treated disease and radiation change. Scattered chronic diffuse interstitial lung disease with a peripheral predominance is again noted. Small bilateral pleural effusions are new in the interval. Musculoskeletal: Bone windows reveal no worrisome lytic or sclerotic osseous lesions. CT ABDOMEN PELVIS FINDINGS Hepatobiliary: No focal abnormality within the liver parenchyma. There is no evidence for gallstones, gallbladder wall thickening, or pericholecystic fluid. No intrahepatic or extrahepatic biliary dilation. Pancreas: No focal mass lesion. No dilatation of the main duct. No intraparenchymal cyst. No peripancreatic edema. Spleen: No splenomegaly. No focal mass lesion. Adrenals/Urinary Tract: No adrenal nodule or mass. 10 mm exophytic lesion in the interpolar right kidney is unchanged and is likely a cyst. Scarring is evident in the upper pole of the left kidney. No evidence for hydroureter. The urinary bladder appears normal for the degree of  distention. Stomach/Bowel: Stomach is nondistended. No gastric wall thickening. No evidence of outlet obstruction. Duodenum is normally positioned as is the ligament of Treitz. No small bowel wall thickening. No small bowel dilatation. The terminal ileum is normal. The appendix is normal. Diverticuli are seen scattered along the entire length of the colon without CT findings of diverticulitis. Vascular/Lymphatic: There is abdominal aortic atherosclerosis without aneurysm. There is no gastrohepatic or hepatoduodenal ligament lymphadenopathy. No intraperitoneal or retroperitoneal lymphadenopy. No pelvic sidewall lymphadenopathy. Reproductive: The prostate gland and seminal vesicles have normal imaging features. Other: No intraperitoneal free fluid. Musculoskeletal: Bilateral inguinal hernias contain only fat Bone windows reveal no worrisome lytic or sclerotic osseous lesions. IMPRESSION: 1. Interval decrease in size of the dominant peripheral left upper lobe pulmonary mass with persistent interstitial and airspace disease medially in the left lung. 2. Interval development of small bilateral pleural effusions. 3. Stable appearance of scattered borderline enlarged mediastinal lymph nodes. 4. No evidence for metastatic disease in the abdomen or pelvis. Imaging  findings of potential clinical significance: Abdominal aortic atherosclerosis. Electronically Signed   By: Misty Stanley M.D.   On: 12/21/2014 13:45    ASSESSMENT AND PLAN: This is a very pleasant 79 years old white male with limited stage small cell lung cancer currently undergoing systemic chemotherapy with carboplatin and etoposide status post 4 cycles. This is now concurrent with radiotherapy. He tolerated the last cycle of his treatment fairly well except for fatigue. The recent CT scan of the chest showed further improvement of his disease. I discussed the scan results with the patient and his wife. I recommended for the patient to proceed with cycle  #5 as a scheduled. He will come back for follow-up visit in 3 weeks for reevaluation before starting cycle #6. For the chemotherapy-induced anemia, we will arrange for the patient to have 2 units of PRBCs transfusion this week. The patient was advised to call immediately if he has any concerning symptoms in the interval. The patient voices understanding of current disease status and treatment options and is in agreement with the current care plan.  All questions were answered. The patient knows to call the clinic with any problems, questions or concerns. We can certainly see the patient much sooner if necessary.  Disclaimer: This note was dictated with voice recognition software. Similar sounding words can inadvertently be transcribed and may not be corrected upon review.

## 2014-12-24 NOTE — Telephone Encounter (Signed)
Gave and printed appt sched and avs fo rpt for NOV and DEc

## 2014-12-24 NOTE — Patient Instructions (Signed)
[facility] Discharge Instructions for Patients Receiving Chemotherapy  Today you received the following chemotherapy agents Etoposide and Carboplatin.  To help prevent nausea and vomiting after your treatment, we encourage you to take your nausea medication as prescribed.   If you develop nausea and vomiting that is not controlled by your nausea medication, call the clinic.   BELOW ARE SYMPTOMS THAT SHOULD BE REPORTED IMMEDIATELY:  *FEVER GREATER THAN 100.5 F  *CHILLS WITH OR WITHOUT FEVER  NAUSEA AND VOMITING THAT IS NOT CONTROLLED WITH YOUR NAUSEA MEDICATION  *UNUSUAL SHORTNESS OF BREATH  *UNUSUAL BRUISING OR BLEEDING  TENDERNESS IN MOUTH AND THROAT WITH OR WITHOUT PRESENCE OF ULCERS  *URINARY PROBLEMS  *BOWEL PROBLEMS  UNUSUAL RASH Items with * indicate a potential emergency and should be followed up as soon as possible.  Feel free to call the clinic you have any questions or concerns. The clinic phone number is [phone].  Please show the CHEMO ALERT CARD at check-in to the Emergency Department and triage nurse.   

## 2014-12-25 ENCOUNTER — Ambulatory Visit (HOSPITAL_BASED_OUTPATIENT_CLINIC_OR_DEPARTMENT_OTHER): Payer: Medicare Other

## 2014-12-25 ENCOUNTER — Ambulatory Visit
Admission: RE | Admit: 2014-12-25 | Discharge: 2014-12-25 | Disposition: A | Discharge status: Home / Self Care | Payer: Medicare Other | Source: Ambulatory Visit | Attending: Radiation Oncology | Admitting: Radiation Oncology

## 2014-12-25 ENCOUNTER — Encounter: Payer: Self-pay | Admitting: Radiation Oncology

## 2014-12-25 ENCOUNTER — Inpatient Hospital Stay (HOSPITAL_COMMUNITY): Admission: RE | Admit: 2014-12-25 | Payer: Medicare Other | Source: Ambulatory Visit

## 2014-12-25 VITALS — BP 129/54 | HR 75 | Temp 97.0°F | Resp 18

## 2014-12-25 DIAGNOSIS — C3492 Malignant neoplasm of unspecified part of left bronchus or lung: Secondary | ICD-10-CM

## 2014-12-25 DIAGNOSIS — C3412 Malignant neoplasm of upper lobe, left bronchus or lung: Secondary | ICD-10-CM

## 2014-12-25 DIAGNOSIS — Z5111 Encounter for antineoplastic chemotherapy: Secondary | ICD-10-CM | POA: Diagnosis not present

## 2014-12-25 DIAGNOSIS — D6489 Other specified anemias: Secondary | ICD-10-CM

## 2014-12-25 MED ORDER — LORAZEPAM 2 MG/ML IJ SOLN
0.5000 mg | INTRAMUSCULAR | Status: AC | PRN
  Administered 2014-12-25: 0.5 mg via INTRAVENOUS

## 2014-12-25 MED ORDER — ACETAMINOPHEN 325 MG PO TABS
ORAL_TABLET | ORAL | Status: AC
  Filled 2014-12-25: qty 2

## 2014-12-25 MED ORDER — SODIUM CHLORIDE 0.9 % IV SOLN
120.0000 mg/m2 | Freq: Once | INTRAVENOUS | Status: AC
  Administered 2014-12-25: 260 mg via INTRAVENOUS
  Filled 2014-12-25: qty 13

## 2014-12-25 MED ORDER — DIPHENHYDRAMINE HCL 25 MG PO CAPS
ORAL_CAPSULE | ORAL | Status: AC
  Filled 2014-12-25: qty 1

## 2014-12-25 MED ORDER — ACETAMINOPHEN 325 MG PO TABS
650.0000 mg | ORAL_TABLET | Freq: Once | ORAL | Status: AC
  Administered 2014-12-25: 650 mg via ORAL

## 2014-12-25 MED ORDER — SODIUM CHLORIDE 0.9 % IV SOLN
Freq: Once | INTRAVENOUS | Status: AC
  Administered 2014-12-25: 11:00:00 via INTRAVENOUS
  Filled 2014-12-25: qty 1

## 2014-12-25 MED ORDER — SODIUM CHLORIDE 0.9 % IJ SOLN
10.0000 mL | INTRAMUSCULAR | Status: AC | PRN
  Filled 2014-12-25: qty 10

## 2014-12-25 MED ORDER — HEPARIN SOD (PORK) LOCK FLUSH 100 UNIT/ML IV SOLN
500.0000 [IU] | Freq: Once | INTRAVENOUS | Status: AC | PRN
  Filled 2014-12-25: qty 5

## 2014-12-25 MED ORDER — SODIUM CHLORIDE 0.9 % IV SOLN
250.0000 mL | Freq: Once | INTRAVENOUS | Status: AC
  Administered 2014-12-25: 250 mL via INTRAVENOUS

## 2014-12-25 MED ORDER — DIPHENHYDRAMINE HCL 25 MG PO CAPS
25.0000 mg | ORAL_CAPSULE | Freq: Once | ORAL | Status: AC
Start: 2014-12-25 — End: 2014-12-25
  Administered 2014-12-25: 25 mg via ORAL

## 2014-12-25 MED ORDER — LORAZEPAM 2 MG/ML IJ SOLN
INTRAMUSCULAR | Status: AC
  Filled 2014-12-25: qty 1

## 2014-12-25 MED ORDER — SODIUM CHLORIDE 0.9 % IV SOLN
Freq: Once | INTRAVENOUS | Status: AC
  Administered 2014-12-25: 10:00:00 via INTRAVENOUS

## 2014-12-25 NOTE — Patient Instructions (Signed)
[facility] Discharge Instructions for Patients Receiving Chemotherapy  Today you received the following chemotherapy agents: Etoposide   To help prevent nausea and vomiting after your treatment, we encourage you to take your nausea medication as directed.    If you develop nausea and vomiting that is not controlled by your nausea medication, call the clinic.   BELOW ARE SYMPTOMS THAT SHOULD BE REPORTED IMMEDIATELY:  *FEVER GREATER THAN 100.5 F  *CHILLS WITH OR WITHOUT FEVER  NAUSEA AND VOMITING THAT IS NOT CONTROLLED WITH YOUR NAUSEA MEDICATION  *UNUSUAL SHORTNESS OF BREATH  *UNUSUAL BRUISING OR BLEEDING  TENDERNESS IN MOUTH AND THROAT WITH OR WITHOUT PRESENCE OF ULCERS  *URINARY PROBLEMS  *BOWEL PROBLEMS  UNUSUAL RASH Items with * indicate a potential emergency and should be followed up as soon as possible.  Feel free to call the clinic you have any questions or concerns. The clinic phone number is [phone].  Please show the [place] at check-in to the Emergency Department and triage nurse.   Blood Transfusion  A blood transfusion is a procedure in which you receive donated blood through an IV tube. You may need a blood transfusion because of illness, surgery, or injury. The blood may come from a donor, or it may be your own blood that you donated previously. The blood given in a transfusion is made up of different types of cells. You may receive:  Red blood cells. These carry oxygen and replace lost blood.  Platelets. These control bleeding.  Plasma. Thishelps blood to clot. If you have hemophilia or another clotting disorder, you may also receive other types of blood products. LET [facility] CARE PROVIDER KNOW ABOUT:  Any allergies you have.  All medicines you are taking, including vitamins, herbs, eye drops, creams, and over-the-counter medicines.  Previous problems you or members of your family have had with the use of  anesthetics.  Any blood disorders you have.  Previous surgeries you have had.  Any medical conditions you may have.  Any previous reactions you have had during a blood transfusion.  RISKS AND COMPLICATIONS Generally, this is a safe procedure. However, problems may occur, including:  Having an allergic reaction to something in the donated blood.  Fever. This may be a reaction to the white blood cells in the transfused blood.  Iron overload. This can happen from having many transfusions.  Transfusion-related acute lung injury (TRALI). This is a rare reaction that causes lung damage. The cause is not known.TRALI can occur within hours of a transfusion or several days later.  Sudden (acute) or delayed hemolytic reactions. This happens if your blood does not match the cells in your transfusion. Your body's defense system (immune system) may try to attack the new cells. This complication is rare.  Infection. This is rare. BEFORE THE PROCEDURE  You may have a blood test to determine your blood type. This is necessary to know what kind of blood your body will accept.  If you are going to have a planned surgery, you may donate your own blood. This may be done in case you need to have a transfusion.  If you have had an allergic reaction to a transfusion in the past, you may be given medicine to help prevent a reaction. Take this medicine only as directed by your health care provider.  You will have your temperature, blood pressure, and pulse monitored before the transfusion. PROCEDURE   An IV will be started in your hand or  arm.  The bag of donated blood will be attached to your IV tube and given into your vein.  Your temperature, blood pressure, and pulse will be monitored regularly during the transfusion. This monitoring is done to detect early signs of a transfusion reaction.  If you have any signs or symptoms of a reaction, your transfusion will be stopped and you may be given  medicine.  When the transfusion is over, your IV will be removed.  Pressure may be applied to the IV site for a few minutes.  A bandage (dressing) will be applied. The procedure may vary among health care providers and hospitals. AFTER THE PROCEDURE  Your blood pressure, temperature, and pulse will be monitored regularly.   This information is not intended to replace advice given to you by your health care provider. Make sure you discuss any questions you have with your health care provider.   Document Released: 01/17/2000 Document Revised: 02/09/2014 Document Reviewed: 11/29/2013 Elsevier Interactive Patient Education [employer].

## 2014-12-25 NOTE — Progress Notes (Unsigned)
2 units of blood given per MD order.

## 2014-12-26 ENCOUNTER — Ambulatory Visit (HOSPITAL_BASED_OUTPATIENT_CLINIC_OR_DEPARTMENT_OTHER): Payer: Medicare Other

## 2014-12-26 ENCOUNTER — Ambulatory Visit
Admission: RE | Admit: 2014-12-26 | Discharge: 2014-12-26 | Disposition: A | Discharge status: Home / Self Care | Payer: Medicare Other | Source: Ambulatory Visit | Attending: Radiation Oncology | Admitting: Radiation Oncology

## 2014-12-26 ENCOUNTER — Encounter: Payer: Self-pay | Admitting: Radiation Oncology

## 2014-12-26 VITALS — BP 155/74 | HR 76 | Resp 18

## 2014-12-26 VITALS — BP 149/66 | HR 79 | Temp 97.8°F | Resp 20 | Wt 206.0 lb

## 2014-12-26 DIAGNOSIS — C3492 Malignant neoplasm of unspecified part of left bronchus or lung: Secondary | ICD-10-CM

## 2014-12-26 DIAGNOSIS — C3412 Malignant neoplasm of upper lobe, left bronchus or lung: Secondary | ICD-10-CM | POA: Diagnosis not present

## 2014-12-26 DIAGNOSIS — Z5111 Encounter for antineoplastic chemotherapy: Secondary | ICD-10-CM

## 2014-12-26 DIAGNOSIS — D6489 Other specified anemias: Secondary | ICD-10-CM | POA: Diagnosis not present

## 2014-12-26 LAB — TYPE AND SCREEN
ABO/RH(D): A POS
Antibody Screen: NEGATIVE
UNIT DIVISION: 0
Unit division: 0

## 2014-12-26 MED ORDER — SODIUM CHLORIDE 0.9 % IV SOLN
120.0000 mg/m2 | Freq: Once | INTRAVENOUS | Status: AC
  Administered 2014-12-26: 260 mg via INTRAVENOUS
  Filled 2014-12-26: qty 13

## 2014-12-26 MED ORDER — SODIUM CHLORIDE 0.9 % IV SOLN
Freq: Once | INTRAVENOUS | Status: AC
  Administered 2014-12-26: 11:00:00 via INTRAVENOUS

## 2014-12-26 MED ORDER — SUCRALFATE 1 G PO TABS: 1.0000 g | ORAL_TABLET | Freq: Four times a day (QID) | ORAL | Status: DC

## 2014-12-26 MED ORDER — LORAZEPAM 2 MG/ML IJ SOLN
0.5000 mg | INTRAMUSCULAR | Status: AC | PRN
  Administered 2014-12-26: 0.5 mg via INTRAVENOUS

## 2014-12-26 MED ORDER — LORAZEPAM 2 MG/ML IJ SOLN
INTRAMUSCULAR | Status: AC
  Filled 2014-12-26: qty 1

## 2014-12-26 MED ORDER — DEXAMETHASONE SODIUM PHOSPHATE 100 MG/10ML IJ SOLN
Freq: Once | INTRAMUSCULAR | Status: AC
  Administered 2014-12-26: 11:00:00 via INTRAVENOUS
  Filled 2014-12-26: qty 1

## 2014-12-26 NOTE — Progress Notes (Signed)
Department of Radiation Oncology  Phone:  [phone] Fax:        [phone]  Weekly Treatment Note    Name: Karl [name] Date: 12/26/2014 MRN: [id number] DOB: [birth year]   Current dose: 22 Gy  Current fraction: 11   MEDICATIONS: Current Outpatient Prescriptions  Medication Sig Dispense Refill  . hyaluronate sodium (RADIAPLEXRX) GEL Apply 1 application topically daily.    . carvedilol (COREG) 3.125 MG tablet Take 6.25 mg by mouth 2 (two) times daily with a meal.     . Cyanocobalamin (VITAMIN B-12) 2500 MCG SUBL Place 2,500 mcg under the tongue daily.     Marland Kitchen dofetilide (TIKOSYN) 250 MCG capsule Take 250 mcg by mouth 2 (two) times daily.    Marland Kitchen HUMALOG KWIKPEN 100 UNIT/ML KiwkPen Inject 4 Units into the skin 3 (three) times daily after meals.   6  . insulin detemir (LEVEMIR) 100 UNIT/ML injection Inject 28 Units into the skin daily.     . nitroGLYCERIN (NITROSTAT) 0.4 MG SL tablet Place 0.4 mg under the tongue every 5 (five) minutes as needed for chest pain.     . Omega-3 Fatty Acids (FISH OIL) 1000 MG CAPS Take 1,000 mg by mouth 2 (two) times daily.     Marland Kitchen PRADAXA 150 MG CAPS capsule TAKE 1 CAPSULE BY MOUTH TWICE DAILY (Patient taking differently: TAKE 150 MG  BY MOUTH TWICE DAILY) 60 capsule 5  . simvastatin (ZOCOR) 40 MG tablet Take 40 mg by mouth daily.      No current facility-administered medications for this encounter.   Facility-Administered Medications Ordered in Other Encounters  Medication Dose Route Frequency Provider Last Rate Last Dose  . 0.9 %  sodium chloride infusion   Intravenous Once Curt Bears, MD      . dexamethasone (DECADRON) 10 mg in sodium chloride 0.9 % 50 mL IVPB   Intravenous Once Curt Bears, MD      . etoposide (VEPESID) 260 mg in sodium chloride 0.9 % 650 mL chemo infusion  120 mg/m2 (Treatment Plan Actual) Intravenous Once Curt Bears, MD      . heparin lock flush 100 unit/mL  500 Units Intracatheter Once PRN Curt Bears, MD       . LORazepam (ATIVAN) injection 0.5 mg  0.5 mg Intravenous PRN Curt Bears, MD      . LORazepam (ATIVAN) injection 0.5 mg  0.5 mg Intravenous PRN Curt Bears, MD      . sodium chloride 0.9 % injection 10 mL  10 mL Intracatheter PRN Curt Bears, MD         ALLERGIES: Amiodarone and Rosiglitazone-metformin   LABORATORY DATA:  Lab Results  Component Value Date   WBC 9.7 12/24/2014   HGB 8.4* 12/24/2014   HCT 25.8* 12/24/2014   MCV 96.1 12/24/2014   PLT 294 12/24/2014   Lab Results  Component Value Date   NA 139 12/24/2014   K 4.3 12/24/2014   CL 108 10/14/2014   CO2 23 12/24/2014   Lab Results  Component Value Date   ALT 9 12/24/2014   AST 13 12/24/2014   ALKPHOS 105 12/24/2014   BILITOT 0.38 12/24/2014     NARRATIVE: Karl [name] was seen today for weekly treatment management. The chart was checked and the patient's films were reviewed.  Weekly  Rad tx left lung  11/30 txs completd, coughs up brownish phelgm mopstly every mornings, no nausea, 1 time last week fish caught in his throat    Appetite good, energy  level better after 2 units PRBC'S ,  10:57 AM BP 149/66 mmHg  Pulse 79  Temp(Src) 97.8 F (36.6 C) (Oral)  Resp 20  Wt 206 lb (93.441 kg)  SpO2 99%  Wt Readings from Last 3 Encounters:  12/26/14 206 lb (93.441 kg)  12/24/14 200 lb 1.6 oz (90.765 kg)  12/21/14 200 lb 11.2 oz (91.037 kg)    PHYSICAL EXAMINATION: weight is 206 lb (93.441 kg). His oral temperature is 97.8 F (36.6 C). His blood pressure is 149/66 and his pulse is 79. His respiration is 20 and oxygen saturation is 99%.        ASSESSMENT: The patient is doing satisfactorily with treatment.  PLAN: We will continue with the patient's radiation treatment as planned. I have given the patient a prescription for Carafate which I anticipate he would and at that from within the next one to 2 weeks. No significant esophagitis currently.

## 2014-12-26 NOTE — Progress Notes (Signed)
Weekly  Rad tx left lung  11/30 txs completd, coughs up brownish phelgm mopstly every mornings, no nausea, 1 time last week fish caught in his throat    Appetite good, energy level better after 2 units PRBC'S ,  10:19 AM BP 149/66 mmHg  Pulse 79  Temp(Src) 97.8 F (36.6 C) (Oral)  Resp 20  Wt 206 lb (93.441 kg)  SpO2 99%  Wt Readings from Last 3 Encounters:  12/26/14 206 lb (93.441 kg)  12/24/14 200 lb 1.6 oz (90.765 kg)  12/21/14 200 lb 11.2 oz (91.037 kg)

## 2014-12-26 NOTE — Patient Instructions (Signed)
[facility] Discharge Instructions for Patients Receiving Chemotherapy  Today you received the following chemotherapy agents: Etoposide   To help prevent nausea and vomiting after your treatment, we encourage you to take your nausea medication as directed.    If you develop nausea and vomiting that is not controlled by your nausea medication, call the clinic.   BELOW ARE SYMPTOMS THAT SHOULD BE REPORTED IMMEDIATELY:  *FEVER GREATER THAN 100.5 F  *CHILLS WITH OR WITHOUT FEVER  NAUSEA AND VOMITING THAT IS NOT CONTROLLED WITH YOUR NAUSEA MEDICATION  *UNUSUAL SHORTNESS OF BREATH  *UNUSUAL BRUISING OR BLEEDING  TENDERNESS IN MOUTH AND THROAT WITH OR WITHOUT PRESENCE OF ULCERS  *URINARY PROBLEMS  *BOWEL PROBLEMS  UNUSUAL RASH Items with * indicate a potential emergency and should be followed up as soon as possible.  Feel free to call the clinic you have any questions or concerns. The clinic phone number is [phone].  Please show the [place] at check-in to the Emergency Department and triage nurse.   Blood Transfusion  A blood transfusion is a procedure in which you receive donated blood through an IV tube. You may need a blood transfusion because of illness, surgery, or injury. The blood may come from a donor, or it may be your own blood that you donated previously. The blood given in a transfusion is made up of different types of cells. You may receive:  Red blood cells. These carry oxygen and replace lost blood.  Platelets. These control bleeding.  Plasma. Thishelps blood to clot. If you have hemophilia or another clotting disorder, you may also receive other types of blood products. LET [facility] CARE PROVIDER KNOW ABOUT:  Any allergies you have.  All medicines you are taking, including vitamins, herbs, eye drops, creams, and over-the-counter medicines.  Previous problems you or members of your family have had with the use of  anesthetics.  Any blood disorders you have.  Previous surgeries you have had.  Any medical conditions you may have.  Any previous reactions you have had during a blood transfusion.  RISKS AND COMPLICATIONS Generally, this is a safe procedure. However, problems may occur, including:  Having an allergic reaction to something in the donated blood.  Fever. This may be a reaction to the white blood cells in the transfused blood.  Iron overload. This can happen from having many transfusions.  Transfusion-related acute lung injury (TRALI). This is a rare reaction that causes lung damage. The cause is not known.TRALI can occur within hours of a transfusion or several days later.  Sudden (acute) or delayed hemolytic reactions. This happens if your blood does not match the cells in your transfusion. Your body's defense system (immune system) may try to attack the new cells. This complication is rare.  Infection. This is rare. BEFORE THE PROCEDURE  You may have a blood test to determine your blood type. This is necessary to know what kind of blood your body will accept.  If you are going to have a planned surgery, you may donate your own blood. This may be done in case you need to have a transfusion.  If you have had an allergic reaction to a transfusion in the past, you may be given medicine to help prevent a reaction. Take this medicine only as directed by your health care provider.  You will have your temperature, blood pressure, and pulse monitored before the transfusion. PROCEDURE   An IV will be started in your hand or  arm.  The bag of donated blood will be attached to your IV tube and given into your vein.  Your temperature, blood pressure, and pulse will be monitored regularly during the transfusion. This monitoring is done to detect early signs of a transfusion reaction.  If you have any signs or symptoms of a reaction, your transfusion will be stopped and you may be given  medicine.  When the transfusion is over, your IV will be removed.  Pressure may be applied to the IV site for a few minutes.  A bandage (dressing) will be applied. The procedure may vary among health care providers and hospitals. AFTER THE PROCEDURE  Your blood pressure, temperature, and pulse will be monitored regularly.   This information is not intended to replace advice given to you by your health care provider. Make sure you discuss any questions you have with your health care provider.   Document Released: 01/17/2000 Document Revised: 02/09/2014 Document Reviewed: 11/29/2013 Elsevier Interactive Patient Education [employer].

## 2014-12-28 ENCOUNTER — Ambulatory Visit (HOSPITAL_BASED_OUTPATIENT_CLINIC_OR_DEPARTMENT_OTHER): Payer: Medicare Other

## 2014-12-28 VITALS — BP 120/61 | HR 68 | Temp 98.2°F

## 2014-12-28 DIAGNOSIS — C3492 Malignant neoplasm of unspecified part of left bronchus or lung: Secondary | ICD-10-CM

## 2014-12-28 DIAGNOSIS — C3412 Malignant neoplasm of upper lobe, left bronchus or lung: Secondary | ICD-10-CM

## 2014-12-28 DIAGNOSIS — D6481 Anemia due to antineoplastic chemotherapy: Secondary | ICD-10-CM

## 2014-12-28 MED ORDER — PEGFILGRASTIM INJECTION 6 MG/0.6ML ~~LOC~~
6.0000 mg | PREFILLED_SYRINGE | Freq: Once | SUBCUTANEOUS | Status: AC
  Administered 2014-12-28: 6 mg via SUBCUTANEOUS
  Filled 2014-12-28: qty 0.6

## 2014-12-31 ENCOUNTER — Other Ambulatory Visit (HOSPITAL_BASED_OUTPATIENT_CLINIC_OR_DEPARTMENT_OTHER): Payer: Medicare Other

## 2014-12-31 ENCOUNTER — Ambulatory Visit
Admission: RE | Admit: 2014-12-31 | Discharge: 2014-12-31 | Disposition: A | Discharge status: Home / Self Care | Payer: Medicare Other | Source: Ambulatory Visit | Attending: Radiation Oncology | Admitting: Radiation Oncology

## 2014-12-31 DIAGNOSIS — D6481 Anemia due to antineoplastic chemotherapy: Secondary | ICD-10-CM

## 2014-12-31 DIAGNOSIS — C3412 Malignant neoplasm of upper lobe, left bronchus or lung: Secondary | ICD-10-CM | POA: Diagnosis not present

## 2014-12-31 DIAGNOSIS — C3492 Malignant neoplasm of unspecified part of left bronchus or lung: Secondary | ICD-10-CM

## 2014-12-31 LAB — CBC WITH DIFFERENTIAL/PLATELET
BASO%: 0.2 % (ref 0.0–2.0)
Basophils Absolute: 0 10*3/uL (ref 0.0–0.1)
EOS%: 0.3 % (ref 0.0–7.0)
Eosinophils Absolute: 0 10*3/uL (ref 0.0–0.5)
HCT: 30.5 % — ABNORMAL LOW (ref 38.4–49.9)
HEMOGLOBIN: 10 g/dL — AB (ref 13.0–17.1)
LYMPH%: 4.9 % — ABNORMAL LOW (ref 14.0–49.0)
MCH: 31.2 pg (ref 27.2–33.4)
MCHC: 32.8 g/dL (ref 32.0–36.0)
MCV: 95 fL (ref 79.3–98.0)
MONO#: 0.1 10*3/uL (ref 0.1–0.9)
MONO%: 0.7 % (ref 0.0–14.0)
NEUT%: 93.9 % — ABNORMAL HIGH (ref 39.0–75.0)
NEUTROS ABS: 10.2 10*3/uL — AB (ref 1.5–6.5)
Platelets: 144 10*3/uL (ref 140–400)
RBC: 3.21 10*6/uL — ABNORMAL LOW (ref 4.20–5.82)
RDW: 17.1 % — AB (ref 11.0–14.6)
WBC: 10.8 10*3/uL — AB (ref 4.0–10.3)
lymph#: 0.5 10*3/uL — ABNORMAL LOW (ref 0.9–3.3)

## 2014-12-31 LAB — COMPREHENSIVE METABOLIC PANEL (CC13)
ALBUMIN: 3.3 g/dL — AB (ref 3.5–5.0)
ALK PHOS: 106 U/L (ref 40–150)
ALT: 13 U/L (ref 0–55)
AST: 13 U/L (ref 5–34)
Anion Gap: 7 mEq/L (ref 3–11)
BUN: 14.5 mg/dL (ref 7.0–26.0)
CO2: 25 meq/L (ref 22–29)
Calcium: 9.2 mg/dL (ref 8.4–10.4)
Chloride: 105 mEq/L (ref 98–109)
Creatinine: 0.9 mg/dL (ref 0.7–1.3)
EGFR: 83 mL/min/{1.73_m2} — AB (ref >90)
GLUCOSE: 266 mg/dL — AB (ref 70–140)
POTASSIUM: 4.9 meq/L (ref 3.5–5.1)
SODIUM: 137 meq/L (ref 136–145)
TOTAL PROTEIN: 6.9 g/dL (ref 6.4–8.3)
Total Bilirubin: 0.81 mg/dL (ref 0.20–1.20)

## 2014-12-31 LAB — MAGNESIUM (CC13): MAGNESIUM: 1.7 mg/dL (ref 1.5–2.5)

## 2015-01-01 ENCOUNTER — Ambulatory Visit
Admission: RE | Admit: 2015-01-01 | Discharge: 2015-01-01 | Disposition: A | Discharge status: Home / Self Care | Payer: Medicare Other | Source: Ambulatory Visit | Attending: Radiation Oncology | Admitting: Radiation Oncology

## 2015-01-01 DIAGNOSIS — C3412 Malignant neoplasm of upper lobe, left bronchus or lung: Secondary | ICD-10-CM | POA: Diagnosis not present

## 2015-01-02 ENCOUNTER — Ambulatory Visit
Admission: RE | Admit: 2015-01-02 | Discharge: 2015-01-02 | Disposition: A | Discharge status: Home / Self Care | Payer: Medicare Other | Source: Ambulatory Visit | Attending: Radiation Oncology | Admitting: Radiation Oncology

## 2015-01-02 DIAGNOSIS — C3412 Malignant neoplasm of upper lobe, left bronchus or lung: Secondary | ICD-10-CM | POA: Diagnosis not present

## 2015-01-03 ENCOUNTER — Ambulatory Visit
Admission: RE | Admit: 2015-01-03 | Discharge: 2015-01-03 | Disposition: A | Discharge status: Home / Self Care | Payer: Medicare Other | Source: Ambulatory Visit | Attending: Radiation Oncology | Admitting: Radiation Oncology

## 2015-01-03 ENCOUNTER — Ambulatory Visit: Payer: Medicare Other

## 2015-01-04 ENCOUNTER — Encounter: Payer: Self-pay | Admitting: *Deleted

## 2015-01-04 ENCOUNTER — Ambulatory Visit
Admission: RE | Admit: 2015-01-04 | Discharge: 2015-01-04 | Disposition: A | Discharge status: Home / Self Care | Payer: Medicare Other | Source: Ambulatory Visit | Attending: Radiation Oncology | Admitting: Radiation Oncology

## 2015-01-04 DIAGNOSIS — C3412 Malignant neoplasm of upper lobe, left bronchus or lung: Secondary | ICD-10-CM | POA: Diagnosis not present

## 2015-01-04 MED ORDER — HYDROCOD POLST-CPM POLST ER 10-8 MG/5ML PO SUER: 5.0000 mL | Freq: Two times a day (BID) | ORAL | Status: DC | PRN

## 2015-01-04 NOTE — Progress Notes (Addendum)
Mr. Hehn missed treatment on yesterday and arrived today with report of diarrhea on yesterday with first stool black in color with subsequent watery brown stools afterwards. He has not experienced any diarrhea today.  Orthostatic pressures noted.  He states he feels weak today and is traveling by W/C and is accompanied by his spouse.  Instructed to hydrate today as ordered by Dr. Lisbeth Renshaw.  Suggested white grape juice, water, and Gatorade, and also advised to forego any caffeine consumption at this time and he and spouse stated understanding.  He also has a cough and is requested and obtained a script for Tussionex per Dr. Lisbeth Renshaw.  Advised to call this weekend ot either medical oncology or radiation oncology if his status declines and both stated understanding of these instructions. VSS documented sitting and standing.

## 2015-01-07 ENCOUNTER — Ambulatory Visit
Admission: RE | Admit: 2015-01-07 | Discharge: 2015-01-07 | Disposition: A | Discharge status: Home / Self Care | Payer: Medicare Other | Source: Ambulatory Visit | Attending: Radiation Oncology | Admitting: Radiation Oncology

## 2015-01-07 ENCOUNTER — Other Ambulatory Visit (HOSPITAL_BASED_OUTPATIENT_CLINIC_OR_DEPARTMENT_OTHER): Payer: Medicare Other

## 2015-01-07 DIAGNOSIS — C3492 Malignant neoplasm of unspecified part of left bronchus or lung: Secondary | ICD-10-CM

## 2015-01-07 DIAGNOSIS — C3412 Malignant neoplasm of upper lobe, left bronchus or lung: Secondary | ICD-10-CM | POA: Diagnosis not present

## 2015-01-07 LAB — CBC WITH DIFFERENTIAL/PLATELET
BASO%: 0.1 % (ref 0.0–2.0)
BASOS ABS: 0 10*3/uL (ref 0.0–0.1)
EOS ABS: 0.1 10*3/uL (ref 0.0–0.5)
EOS%: 1.3 % (ref 0.0–7.0)
HCT: 24.2 % — ABNORMAL LOW (ref 38.4–49.9)
HEMOGLOBIN: 7.8 g/dL — AB (ref 13.0–17.1)
LYMPH#: 0.4 10*3/uL — AB (ref 0.9–3.3)
LYMPH%: 5.8 % — ABNORMAL LOW (ref 14.0–49.0)
MCH: 30.6 pg (ref 27.2–33.4)
MCHC: 32.2 g/dL (ref 32.0–36.0)
MCV: 94.9 fL (ref 79.3–98.0)
MONO#: 0.7 10*3/uL (ref 0.1–0.9)
MONO%: 9.1 % (ref 0.0–14.0)
NEUT%: 83.7 % — ABNORMAL HIGH (ref 39.0–75.0)
NEUTROS ABS: 6.3 10*3/uL (ref 1.5–6.5)
NRBC: 0 % (ref 0–0)
PLATELETS: 22 10*3/uL — AB (ref 140–400)
RBC: 2.55 10*6/uL — AB (ref 4.20–5.82)
RDW: 16.5 % — AB (ref 11.0–14.6)
WBC: 7.6 10*3/uL (ref 4.0–10.3)

## 2015-01-07 LAB — COMPREHENSIVE METABOLIC PANEL
ALBUMIN: 3 g/dL — AB (ref 3.5–5.0)
ALT: 17 U/L (ref 0–55)
ANION GAP: 6 meq/L (ref 3–11)
AST: 13 U/L (ref 5–34)
Alkaline Phosphatase: 111 U/L (ref 40–150)
BUN: 9.4 mg/dL (ref 7.0–26.0)
CHLORIDE: 106 meq/L (ref 98–109)
CO2: 25 meq/L (ref 22–29)
Calcium: 8.9 mg/dL (ref 8.4–10.4)
Creatinine: 0.9 mg/dL (ref 0.7–1.3)
EGFR: 81 mL/min/{1.73_m2} — AB (ref >90)
Glucose: 152 mg/dl — ABNORMAL HIGH (ref 70–140)
POTASSIUM: 4 meq/L (ref 3.5–5.1)
Sodium: 137 mEq/L (ref 136–145)
TOTAL PROTEIN: 6.4 g/dL (ref 6.4–8.3)
Total Bilirubin: 0.48 mg/dL (ref 0.20–1.20)

## 2015-01-07 LAB — MAGNESIUM: Magnesium: 1.6 mg/dl (ref 1.5–2.5)

## 2015-01-07 NOTE — Progress Notes (Signed)
Quick Note:  Call patient with the result and arrange for 2 units of PRBCs transfusion. ______ 

## 2015-01-08 ENCOUNTER — Ambulatory Visit
Admission: RE | Admit: 2015-01-08 | Discharge: 2015-01-08 | Disposition: A | Discharge status: Home / Self Care | Payer: Medicare Other | Source: Ambulatory Visit | Attending: Radiation Oncology | Admitting: Radiation Oncology

## 2015-01-08 ENCOUNTER — Other Ambulatory Visit: Payer: Self-pay | Admitting: Medical Oncology

## 2015-01-08 ENCOUNTER — Telehealth: Payer: Self-pay | Admitting: Internal Medicine

## 2015-01-08 ENCOUNTER — Telehealth: Payer: Self-pay | Admitting: Medical Oncology

## 2015-01-08 DIAGNOSIS — D649 Anemia, unspecified: Secondary | ICD-10-CM

## 2015-01-08 DIAGNOSIS — D6481 Anemia due to antineoplastic chemotherapy: Secondary | ICD-10-CM

## 2015-01-08 DIAGNOSIS — C3412 Malignant neoplasm of upper lobe, left bronchus or lung: Secondary | ICD-10-CM | POA: Diagnosis not present

## 2015-01-08 DIAGNOSIS — T451X5A Adverse effect of antineoplastic and immunosuppressive drugs, initial encounter: Principal | ICD-10-CM

## 2015-01-08 NOTE — Telephone Encounter (Signed)
s.w. pt and advised on thursdays appt.Marland KitchenMarland KitchenMarland KitchenMarland Kitchenpt ok and aware

## 2015-01-08 NOTE — Telephone Encounter (Signed)
Pt aware of need for blood transfusion.

## 2015-01-09 ENCOUNTER — Ambulatory Visit
Admission: RE | Admit: 2015-01-09 | Discharge: 2015-01-09 | Disposition: A | Discharge status: Home / Self Care | Payer: Medicare Other | Source: Ambulatory Visit | Attending: Radiation Oncology | Admitting: Radiation Oncology

## 2015-01-09 ENCOUNTER — Encounter (HOSPITAL_COMMUNITY): Payer: Self-pay | Admitting: Internal Medicine

## 2015-01-09 ENCOUNTER — Encounter: Payer: Self-pay | Admitting: Radiation Oncology

## 2015-01-09 ENCOUNTER — Emergency Department (HOSPITAL_COMMUNITY): Payer: Medicare Other

## 2015-01-09 ENCOUNTER — Inpatient Hospital Stay (HOSPITAL_COMMUNITY)
Admission: EM | Admit: 2015-01-09 | Discharge: 2015-01-11 | DRG: 871 | Disposition: A | Discharge status: Home / Self Care | Payer: Medicare Other | Attending: Internal Medicine | Admitting: Internal Medicine

## 2015-01-09 VITALS — BP 133/51 | HR 94 | Temp 100.4°F | Resp 20

## 2015-01-09 DIAGNOSIS — E440 Moderate protein-calorie malnutrition: Secondary | ICD-10-CM | POA: Diagnosis present

## 2015-01-09 DIAGNOSIS — D6959 Other secondary thrombocytopenia: Secondary | ICD-10-CM | POA: Diagnosis present

## 2015-01-09 DIAGNOSIS — Z6827 Body mass index (BMI) 27.0-27.9, adult: Secondary | ICD-10-CM | POA: Diagnosis not present

## 2015-01-09 DIAGNOSIS — Z823 Family history of stroke: Secondary | ICD-10-CM

## 2015-01-09 DIAGNOSIS — Z7901 Long term (current) use of anticoagulants: Secondary | ICD-10-CM | POA: Diagnosis not present

## 2015-01-09 DIAGNOSIS — Z95810 Presence of automatic (implantable) cardiac defibrillator: Secondary | ICD-10-CM | POA: Diagnosis present

## 2015-01-09 DIAGNOSIS — Z833 Family history of diabetes mellitus: Secondary | ICD-10-CM | POA: Diagnosis not present

## 2015-01-09 DIAGNOSIS — C3412 Malignant neoplasm of upper lobe, left bronchus or lung: Secondary | ICD-10-CM

## 2015-01-09 DIAGNOSIS — I481 Persistent atrial fibrillation: Secondary | ICD-10-CM | POA: Diagnosis not present

## 2015-01-09 DIAGNOSIS — J9601 Acute respiratory failure with hypoxia: Secondary | ICD-10-CM | POA: Diagnosis present

## 2015-01-09 DIAGNOSIS — E1365 Other specified diabetes mellitus with hyperglycemia: Secondary | ICD-10-CM

## 2015-01-09 DIAGNOSIS — I5043 Acute on chronic combined systolic (congestive) and diastolic (congestive) heart failure: Secondary | ICD-10-CM | POA: Diagnosis present

## 2015-01-09 DIAGNOSIS — E119 Type 2 diabetes mellitus without complications: Secondary | ICD-10-CM | POA: Diagnosis not present

## 2015-01-09 DIAGNOSIS — I255 Ischemic cardiomyopathy: Secondary | ICD-10-CM | POA: Diagnosis present

## 2015-01-09 DIAGNOSIS — E785 Hyperlipidemia, unspecified: Secondary | ICD-10-CM | POA: Diagnosis present

## 2015-01-09 DIAGNOSIS — I4891 Unspecified atrial fibrillation: Secondary | ICD-10-CM | POA: Diagnosis present

## 2015-01-09 DIAGNOSIS — IMO0002 Reserved for concepts with insufficient information to code with codable children: Secondary | ICD-10-CM

## 2015-01-09 DIAGNOSIS — Z794 Long term (current) use of insulin: Secondary | ICD-10-CM

## 2015-01-09 DIAGNOSIS — I119 Hypertensive heart disease without heart failure: Secondary | ICD-10-CM | POA: Diagnosis not present

## 2015-01-09 DIAGNOSIS — M1990 Unspecified osteoarthritis, unspecified site: Secondary | ICD-10-CM | POA: Diagnosis present

## 2015-01-09 DIAGNOSIS — I2510 Atherosclerotic heart disease of native coronary artery without angina pectoris: Secondary | ICD-10-CM | POA: Diagnosis present

## 2015-01-09 DIAGNOSIS — C3490 Malignant neoplasm of unspecified part of unspecified bronchus or lung: Secondary | ICD-10-CM

## 2015-01-09 DIAGNOSIS — D649 Anemia, unspecified: Secondary | ICD-10-CM

## 2015-01-09 DIAGNOSIS — J181 Lobar pneumonia, unspecified organism: Secondary | ICD-10-CM | POA: Diagnosis present

## 2015-01-09 DIAGNOSIS — I110 Hypertensive heart disease with heart failure: Secondary | ICD-10-CM | POA: Diagnosis present

## 2015-01-09 DIAGNOSIS — Z510 Encounter for antineoplastic radiation therapy: Secondary | ICD-10-CM | POA: Diagnosis present

## 2015-01-09 DIAGNOSIS — Z951 Presence of aortocoronary bypass graft: Secondary | ICD-10-CM | POA: Diagnosis not present

## 2015-01-09 DIAGNOSIS — C3402 Malignant neoplasm of left main bronchus: Secondary | ICD-10-CM | POA: Diagnosis present

## 2015-01-09 DIAGNOSIS — E1169 Type 2 diabetes mellitus with other specified complication: Secondary | ICD-10-CM | POA: Diagnosis present

## 2015-01-09 DIAGNOSIS — D899 Disorder involving the immune mechanism, unspecified: Secondary | ICD-10-CM | POA: Diagnosis present

## 2015-01-09 DIAGNOSIS — Z79899 Other long term (current) drug therapy: Secondary | ICD-10-CM | POA: Diagnosis not present

## 2015-01-09 DIAGNOSIS — E1159 Type 2 diabetes mellitus with other circulatory complications: Secondary | ICD-10-CM

## 2015-01-09 DIAGNOSIS — Z85828 Personal history of other malignant neoplasm of skin: Secondary | ICD-10-CM | POA: Diagnosis not present

## 2015-01-09 DIAGNOSIS — I5022 Chronic systolic (congestive) heart failure: Secondary | ICD-10-CM

## 2015-01-09 DIAGNOSIS — T451X5A Adverse effect of antineoplastic and immunosuppressive drugs, initial encounter: Secondary | ICD-10-CM | POA: Diagnosis present

## 2015-01-09 DIAGNOSIS — Z955 Presence of coronary angioplasty implant and graft: Secondary | ICD-10-CM | POA: Diagnosis not present

## 2015-01-09 DIAGNOSIS — C3492 Malignant neoplasm of unspecified part of left bronchus or lung: Secondary | ICD-10-CM | POA: Diagnosis present

## 2015-01-09 DIAGNOSIS — R0902 Hypoxemia: Secondary | ICD-10-CM

## 2015-01-09 DIAGNOSIS — A419 Sepsis, unspecified organism: Secondary | ICD-10-CM | POA: Diagnosis present

## 2015-01-09 DIAGNOSIS — I480 Paroxysmal atrial fibrillation: Secondary | ICD-10-CM | POA: Diagnosis not present

## 2015-01-09 DIAGNOSIS — J154 Pneumonia due to other streptococci: Secondary | ICD-10-CM | POA: Diagnosis present

## 2015-01-09 DIAGNOSIS — M069 Rheumatoid arthritis, unspecified: Secondary | ICD-10-CM | POA: Diagnosis present

## 2015-01-09 DIAGNOSIS — E1359 Other specified diabetes mellitus with other circulatory complications: Secondary | ICD-10-CM

## 2015-01-09 DIAGNOSIS — Z8249 Family history of ischemic heart disease and other diseases of the circulatory system: Secondary | ICD-10-CM

## 2015-01-09 DIAGNOSIS — J189 Pneumonia, unspecified organism: Secondary | ICD-10-CM

## 2015-01-09 DIAGNOSIS — Z888 Allergy status to other drugs, medicaments and biological substances status: Secondary | ICD-10-CM | POA: Diagnosis not present

## 2015-01-09 DIAGNOSIS — D6481 Anemia due to antineoplastic chemotherapy: Secondary | ICD-10-CM

## 2015-01-09 DIAGNOSIS — E1165 Type 2 diabetes mellitus with hyperglycemia: Secondary | ICD-10-CM | POA: Diagnosis present

## 2015-01-09 DIAGNOSIS — R0600 Dyspnea, unspecified: Secondary | ICD-10-CM

## 2015-01-09 DIAGNOSIS — R509 Fever, unspecified: Secondary | ICD-10-CM | POA: Diagnosis present

## 2015-01-09 DIAGNOSIS — D696 Thrombocytopenia, unspecified: Secondary | ICD-10-CM | POA: Diagnosis present

## 2015-01-09 DIAGNOSIS — Z950 Presence of cardiac pacemaker: Secondary | ICD-10-CM | POA: Diagnosis not present

## 2015-01-09 HISTORY — DX: Fever presenting with conditions classified elsewhere: R50.81

## 2015-01-09 HISTORY — DX: Neutropenia, unspecified: D70.9

## 2015-01-09 HISTORY — DX: Shortness of breath: R06.02

## 2015-01-09 LAB — URINE MICROSCOPIC-ADD ON

## 2015-01-09 LAB — BASIC METABOLIC PANEL
ANION GAP: 11 (ref 5–15)
BUN: 14 mg/dL (ref 6–20)
CO2: 25 mmol/L (ref 22–32)
Calcium: 8.6 mg/dL — ABNORMAL LOW (ref 8.9–10.3)
Chloride: 101 mmol/L (ref 101–111)
Creatinine, Ser: 1.04 mg/dL (ref 0.61–1.24)
GLUCOSE: 111 mg/dL — AB (ref 65–99)
POTASSIUM: 3.9 mmol/L (ref 3.5–5.1)
Sodium: 137 mmol/L (ref 135–145)

## 2015-01-09 LAB — LACTIC ACID, PLASMA
LACTIC ACID, VENOUS: 1.5 mmol/L (ref 0.5–2.0)
Lactic Acid, Venous: 1.5 mmol/L (ref 0.5–2.0)

## 2015-01-09 LAB — CBC WITH DIFFERENTIAL/PLATELET
BASOS PCT: 0 %
Basophils Absolute: 0 10*3/uL (ref 0.0–0.1)
Eosinophils Absolute: 0 10*3/uL (ref 0.0–0.7)
Eosinophils Relative: 0 %
HEMATOCRIT: 24.6 % — AB (ref 39.0–52.0)
HEMOGLOBIN: 8 g/dL — AB (ref 13.0–17.0)
LYMPHS PCT: 5 %
Lymphs Abs: 0.4 10*3/uL — ABNORMAL LOW (ref 0.7–4.0)
MCH: 31.4 pg (ref 26.0–34.0)
MCHC: 32.5 g/dL (ref 30.0–36.0)
MCV: 96.5 fL (ref 78.0–100.0)
MONOS PCT: 6 %
Monocytes Absolute: 0.5 10*3/uL (ref 0.1–1.0)
NEUTROS ABS: 8 10*3/uL — AB (ref 1.7–7.7)
NEUTROS PCT: 89 %
Platelets: 39 10*3/uL — ABNORMAL LOW (ref 150–400)
RBC: 2.55 MIL/uL — ABNORMAL LOW (ref 4.22–5.81)
RDW: 16.7 % — ABNORMAL HIGH (ref 11.5–15.5)
WBC: 8.9 10*3/uL (ref 4.0–10.5)

## 2015-01-09 LAB — URINALYSIS, ROUTINE W REFLEX MICROSCOPIC
Glucose, UA: NEGATIVE mg/dL
Hgb urine dipstick: NEGATIVE
Ketones, ur: NEGATIVE mg/dL
Leukocytes, UA: NEGATIVE
NITRITE: NEGATIVE
PH: 5.5 (ref 5.0–8.0)
Protein, ur: 30 mg/dL — AB
SPECIFIC GRAVITY, URINE: 1.024 (ref 1.005–1.030)

## 2015-01-09 LAB — GLUCOSE, CAPILLARY: Glucose-Capillary: 140 mg/dL — ABNORMAL HIGH (ref 65–99)

## 2015-01-09 LAB — BRAIN NATRIURETIC PEPTIDE: B NATRIURETIC PEPTIDE 5: 619.2 pg/mL — AB (ref 0.0–100.0)

## 2015-01-09 LAB — CBG MONITORING, ED: Glucose-Capillary: 101 mg/dL — ABNORMAL HIGH (ref 65–99)

## 2015-01-09 LAB — POC OCCULT BLOOD, ED: FECAL OCCULT BLD: NEGATIVE

## 2015-01-09 LAB — I-STAT CG4 LACTIC ACID, ED: LACTIC ACID, VENOUS: 0.8 mmol/L (ref 0.5–2.0)

## 2015-01-09 LAB — PROCALCITONIN: Procalcitonin: 1.94 ng/mL

## 2015-01-09 MED ORDER — HYDROCOD POLST-CPM POLST ER 10-8 MG/5ML PO SUER
5.0000 mL | Freq: Two times a day (BID) | ORAL | Status: DC | PRN
  Administered 2015-01-10: 5 mL via ORAL
  Filled 2015-01-09: qty 5

## 2015-01-09 MED ORDER — DABIGATRAN ETEXILATE MESYLATE 150 MG PO CAPS
150.0000 mg | ORAL_CAPSULE | Freq: Two times a day (BID) | ORAL | Status: DC
  Filled 2015-01-09: qty 1

## 2015-01-09 MED ORDER — LEVALBUTEROL HCL 0.63 MG/3ML IN NEBU: 0.6300 mg | INHALATION_SOLUTION | Freq: Four times a day (QID) | RESPIRATORY_TRACT | Status: DC | PRN

## 2015-01-09 MED ORDER — IPRATROPIUM BROMIDE 0.02 % IN SOLN: 0.5000 mg | Freq: Four times a day (QID) | RESPIRATORY_TRACT | Status: DC | PRN

## 2015-01-09 MED ORDER — ACETAMINOPHEN 500 MG PO TABS: 500.0000 mg | ORAL_TABLET | Freq: Four times a day (QID) | ORAL | Status: DC | PRN

## 2015-01-09 MED ORDER — SIMVASTATIN 40 MG PO TABS
40.0000 mg | ORAL_TABLET | Freq: Every day | ORAL | Status: DC
  Administered 2015-01-10 – 2015-01-11 (×2): 40 mg via ORAL
  Filled 2015-01-09 (×2): qty 1

## 2015-01-09 MED ORDER — VITAMIN B-12 1000 MCG PO TABS
2500.0000 ug | ORAL_TABLET | Freq: Every day | ORAL | Status: DC
  Administered 2015-01-10 – 2015-01-11 (×2): 2500 ug via ORAL
  Filled 2015-01-09 (×2): qty 2.5

## 2015-01-09 MED ORDER — FUROSEMIDE 10 MG/ML IJ SOLN
20.0000 mg | Freq: Once | INTRAMUSCULAR | Status: AC
  Administered 2015-01-09: 20 mg via INTRAVENOUS
  Filled 2015-01-09: qty 2

## 2015-01-09 MED ORDER — IPRATROPIUM-ALBUTEROL 0.5-2.5 (3) MG/3ML IN SOLN: 3.0000 mL | RESPIRATORY_TRACT | Status: DC | PRN

## 2015-01-09 MED ORDER — SODIUM CHLORIDE 0.9 % IV BOLUS (SEPSIS)
500.0000 mL | Freq: Once | INTRAVENOUS | Status: AC
  Administered 2015-01-09: 500 mL via INTRAVENOUS

## 2015-01-09 MED ORDER — SODIUM CHLORIDE 0.9 % IV SOLN
INTRAVENOUS | Status: DC
  Administered 2015-01-09: 17:00:00 via INTRAVENOUS

## 2015-01-09 MED ORDER — SODIUM CHLORIDE 0.9 % IV SOLN: INTRAVENOUS | Status: DC

## 2015-01-09 MED ORDER — OMEGA-3-ACID ETHYL ESTERS 1 G PO CAPS
1.0000 g | ORAL_CAPSULE | Freq: Every day | ORAL | Status: DC
  Administered 2015-01-10 – 2015-01-11 (×2): 1 g via ORAL
  Filled 2015-01-09 (×2): qty 1

## 2015-01-09 MED ORDER — DOFETILIDE 250 MCG PO CAPS
250.0000 ug | ORAL_CAPSULE | Freq: Two times a day (BID) | ORAL | Status: DC
  Administered 2015-01-09 – 2015-01-11 (×4): 250 ug via ORAL
  Filled 2015-01-09 (×5): qty 1

## 2015-01-09 MED ORDER — INSULIN ASPART 100 UNIT/ML ~~LOC~~ SOLN
0.0000 [IU] | Freq: Three times a day (TID) | SUBCUTANEOUS | Status: DC
Start: 2015-01-10 — End: 2015-01-11
  Administered 2015-01-10 – 2015-01-11 (×2): 2 [IU] via SUBCUTANEOUS

## 2015-01-09 MED ORDER — INSULIN DETEMIR 100 UNIT/ML ~~LOC~~ SOLN
17.0000 [IU] | Freq: Every day | SUBCUTANEOUS | Status: DC
  Filled 2015-01-09 (×3): qty 0.17

## 2015-01-09 MED ORDER — CARVEDILOL 6.25 MG PO TABS
6.2500 mg | ORAL_TABLET | Freq: Two times a day (BID) | ORAL | Status: DC
  Administered 2015-01-10 – 2015-01-11 (×4): 6.25 mg via ORAL
  Filled 2015-01-09 (×3): qty 1

## 2015-01-09 MED ORDER — LEVOFLOXACIN IN D5W 500 MG/100ML IV SOLN
500.0000 mg | Freq: Once | INTRAVENOUS | Status: AC
  Administered 2015-01-09: 500 mg via INTRAVENOUS
  Filled 2015-01-09: qty 100

## 2015-01-09 MED ORDER — INSULIN ASPART 100 UNIT/ML ~~LOC~~ SOLN
4.0000 [IU] | Freq: Three times a day (TID) | SUBCUTANEOUS | Status: DC
  Administered 2015-01-10: 4 [IU] via SUBCUTANEOUS
  Filled 2015-01-09 (×3): qty 0.04

## 2015-01-09 MED ORDER — LEVOFLOXACIN IN D5W 750 MG/150ML IV SOLN
750.0000 mg | INTRAVENOUS | Status: DC
  Administered 2015-01-10: 750 mg via INTRAVENOUS
  Filled 2015-01-09 (×2): qty 150

## 2015-01-09 NOTE — Progress Notes (Signed)
Patient presents to the clinic today following radiation treatment requesting to be evaluated by a physician. Patient reports fatigue, weakness, lack of energy and increased frequency of cough. Reports cough is mostly productive with grayish sputum. Denies hemoptysis. Denies pain or difficulty swallowing. Low grade fever noted. Grandson reports the patient's temperature reached 101 years. Patient reports for the last several mornings when he woke up he "was blind for 30 minutes or so." States, he has no appetite. Scheduled to received a blood transfusion tomorrow order by Dr. Julien Nordmann. Reports he last received chemotherapy two weeks ago and his next does is scheduled for next week. Reports shortness of breath. Noted patient using accessory muscle to breath. Reports hand tremors are worse. Has an external defibrillator. [name], MD is his PCP and Tollie Eth, MD is his cardiac doctor.  BP 133/51 mmHg  Pulse 94  Temp(Src) 100.4 F (38 C) (Oral)  Resp 20  SpO2 92% Wt Readings from Last 3 Encounters:  12/26/14 206 lb (93.441 kg)  12/24/14 200 lb 1.6 oz (90.765 kg)  12/21/14 200 lb 11.2 oz (91.037 kg)

## 2015-01-09 NOTE — ED Notes (Signed)
Pt, sent by [facility], c/o SOB and fever (100.4).  O2 sat 92% on 2L [place].  Hx of small cell lung CA.  Pt received radiation today.  Pt has an external defibrillator.

## 2015-01-09 NOTE — Progress Notes (Signed)
Advised by Dr. Thea Silversmith to take patient to emergency department. Phoned Alana, RN (charge). She assigned bed 3. Wheeled patient to exam 3 in the emergency room. Assisted patient into bed. Provided report to Garza[place] II, Therapist, sports.

## 2015-01-09 NOTE — Progress Notes (Signed)
Weekly Management Note Current Dose: 36 Gy  Projected Dose: 60 Gy   Narrative: He presents today for complaints of a productive cough, fatigue, weakness, and a fever up to 101F. Also reports symptoms of being "blind" for up to "30 minutes" in the morning. He was found to be anemic and has blood transfusions scheduled tomorrow. He is accompanied by his grandson.  Physical Findings: Pale gentleman in mild respiratory distress with external defibrillator in place. In a wheelchair.  Vitals:  Filed Vitals:   01/09/15 1131  BP: 133/51  Pulse: 94  Temp: 100.4 F (38 C)  Resp: 20   Weight:  Wt Readings from Last 3 Encounters:  12/26/14 206 lb (93.441 kg)  12/24/14 200 lb 1.6 oz (90.765 kg)  12/21/14 200 lb 11.2 oz (91.037 kg)   Lab Results  Component Value Date   WBC 7.6 01/07/2015   HGB 7.8* 01/07/2015   HCT 24.2* 01/07/2015   MCV 94.9 01/07/2015   PLT 22* 01/07/2015   Lab Results  Component Value Date   CREATININE 0.9 01/07/2015   BUN 9.4 01/07/2015   NA 137 01/07/2015   K 4.0 01/07/2015   CL 108 10/14/2014   CO2 25 01/07/2015    Impression: Small-cell lung cancer patient with anemia and cough as well as a fever to 101F.  Plan: Contact hospitalist in regard to hospitalization. We will also contact symptom management or infusion suite to fit him in for a blood transfusion. I spoke with Dr. Julien Nordmann who agreed with these plans.  This document serves as a record of services personally performed by Thea Silversmith, MD. It was created on her behalf by Darcus Austin, a trained medical scribe. The creation of this record is based on the scribe's personal observations and the provider's statements to them. This document has been checked and approved by the attending provider.

## 2015-01-09 NOTE — ED Provider Notes (Signed)
CSN: [id number]     Arrival date & time 01/09/15  1235 History   First MD Initiated Contact with Patient 01/09/15 1400     Chief Complaint  Patient presents with  . Shortness of Breath     (Consider location/radiation/quality/duration/timing/severity/associated sxs/prior Treatment) HPI   Karl [name] is a 79 y.o. male was at the cancer center, this morning, when it was noted that he was hypoxic. He was transferred here for evaluation. Probably his oxygen saturations on room air were around 90%, and they improved to 98% on nasal cannula oxygen. He is receiving chemotherapy and daily radiation, for lung cancer. This morning after his radiation session, he went to the oncology clinic to be evaluated for cough and fever. He reports onset of these problems for several days ago. He's also had periods of "blindness in the early morning for about 30 minutes, which resolved spontaneously for the last several days. Today at the clinic he was diagnosed with worsening anemia, and recommended to be transfused. He was sent here to see the hospitalist, for admission. Patient describes intermittent periods of cough, shortness of breath and fever for several weeks. He had his defibrillator removed about one month ago and has been wearing a life vest, since that time. He has had decreased appetite and decreased oral fluid intake, for several weeks, but no vomiting or diarrhea. He is taking his usual medications, without relief. He denies blood in stool, hematemesis, hemoptysis, hematuria. There are no other known modifying factors.  Past Medical History  Diagnosis Date  . Coronary artery disease     status post CABG by Dr. Redmond Pulling in 1993 and status post percutaneous transluminal coronary angioplasty and stenting by Dr. Wynonia Lawman in 2002 and 2006  . Carotid artery occlusion     severe left internal carotid artery stenosis, asymptomatic status post carotid endarterectomy  . Hypertensive heart disease without CHF    . Carotid artery disease   . Hyperlipidemia   . ICD (implantable cardiac defibrillator) in place   . CHF (congestive heart failure) ([place])   . Anginal pain ([place])   . Pacemaker   . Type 2 diabetes mellitus with vascular disease ([place]) 12/11/2008  . H/O hiatal hernia     "gone after my bypass"  . Arthritis     "neck, back, knees"  . Ischemic cardiomyopathy   . Atrial fibrillation ([place])   . Rheumatoid arthritis(714.0)   . AICD (automatic cardioverter/defibrillator) present   . 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 Allergy   . Lung cancer ([place]) 09/11/14    eft upper lobe lung  . Myocardial infarction ([place]) ~ 1992  . Antineoplastic chemotherapy induced anemia 12/24/2014   Past Surgical History  Procedure Laterality Date  . Carotid endarterectomy  2009    left  . Cardiac defibrillator placement  05/2006    St Jude   . Eye surgery  March 2013    Cataract Left eye  . Eye surgery  06/08/2011    Cataract Right eye  . Pr vein bypass graft,aorto-fem-pop    . Inguinal hernia repair  1970's    bilaterally  . Hemorrhoid surgery  1970's  . Coronary angioplasty with stent placement      "I've got 2" (08/10/11)  . Cardioversion  08/2010  . Insert / replace / remove pacemaker  05/2006    "got a defibrillator/pacemaker" (08/10/11)  . Coronary artery bypass graft  08/1991    CABG X5  . Cardioversion  08/13/2011  Procedure: CARDIOVERSION;  Surgeon: Jacolyn Reedy, MD;  Location: [facility];  Service: Cardiovascular;  Laterality: N/A;  . Left heart catheterization with coronary/graft angiogram N/A 06/16/2012    Procedure: LEFT HEART CATHETERIZATION WITH Beatrix Fetters;  Surgeon: Jacolyn Reedy, MD;  Location: [facility] CATH LAB;  Service: Cardiovascular;  Laterality: N/A;  . Video bronchoscopy Bilateral 09/11/2014    Procedure: VIDEO BRONCHOSCOPY WITH FLUORO;  Surgeon: Juanito Doom, MD;  Location: [facility];  Service: Cardiopulmonary;  Laterality: Bilateral;  . Cataract surgery Bilateral   . Ep  implantable device N/A 11/26/2014    Procedure:  ICD Generator Removal /Can only;  Surgeon: Evans Lance, MD;  Location: [facility] CV LAB;  Service: Cardiovascular;  Laterality: N/A;   LVEF of 35% documented via echocardiogram on 08/05/2011, CHADS Score: 3, CHA2DS2-VASC Score: 4 ____________________________     Family History  Problem Relation Age of Onset  . Diabetes Father   . Heart disease Father     Heart Disease before age 68  . Heart attack Father   . Stroke Mother    Social History  Substance Use Topics  . Smoking status: Former Smoker -- 1.00 packs/day for 26 years    Types: Cigarettes    Quit date: 10/20/1980  . Smokeless tobacco: Former Systems developer    Types: Chew    Quit date: 04/02/2013  . Alcohol Use: 1.2 oz/week    2 Cans of beer per week     Comment: per week stopped alcohol 2 years ago 1-2 beers occasuionally    Review of Systems  All other systems reviewed and are negative.     Allergies  Amiodarone and Rosiglitazone-metformin  Home Medications   Prior to Admission medications   Medication Sig Start Date End Date Taking? Authorizing Provider  acetaminophen (TYLENOL) 500 MG tablet Take 500 mg by mouth every 6 (six) hours as needed for mild pain, moderate pain or headache.   Yes Historical Provider, MD  carvedilol (COREG) 3.125 MG tablet Take 6.25 mg by mouth 2 (two) times daily with a meal.    Yes Historical Provider, MD  chlorpheniramine-HYDROcodone (TUSSIONEX) 10-8 MG/5ML SUER Take 5 mLs by mouth every 12 (twelve) hours as needed for cough. 01/04/15  Yes Kyung Rudd, MD  Cyanocobalamin (VITAMIN B-12) 2500 MCG SUBL Place 2,500 mcg under the tongue daily.    Yes Historical Provider, MD  dofetilide (TIKOSYN) 250 MCG capsule Take 250 mcg by mouth 2 (two) times daily.   Yes Historical Provider, MD  HUMALOG KWIKPEN 100 UNIT/ML KiwkPen Inject 4 Units into the skin 3 (three) times daily after meals.  09/04/14  Yes Historical Provider, MD  insulin detemir  (LEVEMIR) 100 UNIT/ML injection Inject 34 Units into the skin daily.    Yes Historical Provider, MD  nitroGLYCERIN (NITROSTAT) 0.4 MG SL tablet Place 0.4 mg under the tongue every 5 (five) minutes as needed for chest pain.    Yes Historical Provider, MD  Omega-3 Fatty Acids (FISH OIL) 1000 MG CAPS Take 1,000 mg by mouth 2 (two) times daily.    Yes Historical Provider, MD  PRADAXA 150 MG CAPS capsule TAKE 1 CAPSULE BY MOUTH TWICE DAILY Patient taking differently: TAKE 150 MG  BY MOUTH TWICE DAILY 03/06/13  Yes Evans Lance, MD  simvastatin (ZOCOR) 40 MG tablet Take 40 mg by mouth daily.    Yes Historical Provider, MD  sucralfate (CARAFATE) 1 G tablet Take 1 tablet (1 g total) by mouth 4 (four) times daily. Patient not  taking: Reported on 01/09/2015 12/26/14   Kyung Rudd, MD   BP 131/60 mmHg  Pulse 85  Temp(Src) 98.7 F (37.1 C) (Oral)  Resp 26  SpO2 97% Physical Exam  Constitutional: He is oriented to person, place, and time. He appears well-developed and well-nourished.  HENT:  Head: Normocephalic and atraumatic.  Right Ear: External ear normal.  Left Ear: External ear normal.  Oral mucous membranes are dry  Eyes: Conjunctivae and EOM are normal. Pupils are equal, round, and reactive to light.  Neck: Normal range of motion and phonation normal. Neck supple.  Cardiovascular: Normal rate, regular rhythm and normal heart sounds.   Pulmonary/Chest: Effort normal. No respiratory distress. He has no wheezes. He exhibits no bony tenderness.  Scattered rhonchi, fair air movement, no wheezes.  Abdominal: Soft. There is no tenderness.  Genitourinary:  Normal penis. Small amount of brown stool in the rectal vault. No rectal impaction.  Musculoskeletal: Normal range of motion. He exhibits no tenderness.  Neurological: He is alert and oriented to person, place, and time. No cranial nerve deficit or sensory deficit. He exhibits normal muscle tone. Coordination normal.  Skin: Skin is warm, dry and  intact.  Psychiatric: He has a normal mood and affect. His behavior is normal. Judgment and thought content normal.  Nursing note and vitals reviewed.   ED Course  Procedures (including critical care time)  Medications  0.9 %  sodium chloride infusion (not administered)  sodium chloride 0.9 % bolus 500 mL (500 mLs Intravenous New Bag/Given 01/09/15 1434)    Patient Vitals for the past 24 hrs:  BP Temp Temp src Pulse Resp SpO2  01/09/15 1330 131/60 mmHg - - 85 26 97 %  01/09/15 1235 - 98.7 F (37.1 C) Oral 97 26 91 %    3:49 PM Reevaluation with update and discussion. After initial assessment and treatment, an updated evaluation reveals no change in clinical status. He remains comfortable. He is eating lunch. Findings discussed with the patient and family members, all questions were answered. Cap [place] L    4:34 PM-Consult complete with Hospitalist. Patient case explained and discussed. She agrees to admit patient for further evaluation and treatment. Call ended at 16:55  Labs Review Labs Reviewed  BASIC METABOLIC PANEL - Abnormal; Notable for the following:    Glucose, Bld 111 (*)    Calcium 8.6 (*)    All other components within normal limits  CBC WITH DIFFERENTIAL/PLATELET - Abnormal; Notable for the following:    RBC 2.55 (*)    Hemoglobin 8.0 (*)    HCT 24.6 (*)    RDW 16.7 (*)    Platelets 39 (*)    Neutro Abs 8.0 (*)    Lymphs Abs 0.4 (*)    All other components within normal limits  CBG MONITORING, ED - Abnormal; Notable for the following:    Glucose-Capillary 101 (*)    All other components within normal limits  URINE CULTURE  CULTURE, BLOOD (ROUTINE X 2)  CULTURE, BLOOD (ROUTINE X 2)  URINALYSIS, ROUTINE W REFLEX MICROSCOPIC (NOT AT [facility])  BRAIN NATRIURETIC PEPTIDE  I-STAT CG4 LACTIC ACID, ED  TYPE AND SCREEN    HEMOGLOBIN  Date Value Ref Range Status  01/09/2015 8.0* 13.0 - 17.0 g/dL Final  10/14/2014 10.5* 13.0 - 17.0 g/dL Final  10/13/2014 10.6*  13.0 - 17.0 g/dL Final  10/13/2014 10.3* 13.0 - 17.0 g/dL Final   HGB  Date Value Ref Range Status  01/07/2015 7.8* 13.0 - 17.1 g/dL Final  12/31/2014 10.0* 13.0 - 17.1 g/dL Final  12/24/2014 8.4* 13.0 - 17.1 g/dL Final  12/17/2014 7.8* 13.0 - 17.1 g/dL Final     Imaging Review Dg Chest 2 View  01/09/2015  CLINICAL DATA:  Increasing cough for 1 week. Lung cancer under treatment. EXAM: CHEST  2 VIEW COMPARISON:  10/11/2014 FINDINGS: Generator pack has been removed since prior. Unchanged positioning of single chamber ICD/ pacer lead into the right ventricle. Unchanged cardiomegaly.  There is been CABG and coronary stenting. Chronic interstitial coarsening which is diffusely increased from prior. There is a focal opacity in the left upper lobe which correlates with lung cancer and surrounding airspace disease. IMPRESSION: 1. Progressive interstitial coarsening which could be congestive or infectious. 2. Known left upper lobe malignancy. Electronically Signed   By: Monte Fantasia M.D.   On: 01/09/2015 15:06   I have personally reviewed and evaluated these images and lab results as part of my medical decision-making.   EKG Interpretation None      MDM   Final diagnoses:  Dyspnea  Anemia, unspecified anemia type  Chronic systolic congestive heart failure (HCC)  Hypoxia    Nonspecific shortness. of breath and cough. No clinical evidence for pneumonia. Possible mild CHF. Patient has a history of systolic heart failure with an EF of 35% in 2013. He has a low-grade fever today, and a normal lactate. No evidence for lobar pneumonia. Patient has anemia, which recurred and is status post blood transfusion 12/25/2014. No evident source of bleeding.  Nursing Notes Reviewed/ Care Coordinated, and agree without changes. Applicable Imaging Reviewed.  Interpretation of Laboratory Data incorporated into ED treatment    Daleen Bo, MD 01/09/15 1658

## 2015-01-09 NOTE — Addendum Note (Signed)
Encounter addended by: Heywood Footman, RN on: 01/09/2015 12:46 PM<BR>     Documentation filed: Notes Section

## 2015-01-09 NOTE — ED Notes (Signed)
Bed: WA03 Expected date:  Expected time:  Means of arrival:  Comments: Pt from [facility]

## 2015-01-09 NOTE — ED Notes (Signed)
Pt brought to ED from cancer center for c/o shortness of breath x 2 weeks. Per radiology/oncology RN pt o2 sat between 86-91% on room air. Pt placed on 2L O2 via [place] upon arrival sating 98%.

## 2015-01-09 NOTE — H&P (Signed)
[facility] History and Physical  Karl [name] DJT:[id number] DOB: Jun 06, 1935 DOA: 01/09/2015  Referring physician: ER physician: Dr. Daleen Bo PCP: [name], MD  Chief Complaint: cough, fever  HPI:  79 year old male with past medical history of small cell lung cancer diagnosed in August 2016, by Dr. Lew Dawes care, status post systemic chemotherapy, radiation therapy under Dr. Lisbeth Renshaw, atrial fibrillation on anticoagulation with pradaxa, chronic combiend CHF (2 D ECHO in 2014 with EF of 35 % and grade 2 diastolic dysfunction), dyslipidemia, diabetes who presented to [facility] for radiation therapy. He was found to be hypoxic with oxygen saturation of about 90% on room air. Patient also reported associated shortness of breath, weakness, fatigue in addition to cough productive of grayish sputum, fevers at home. Patient also reported poor by mouth intake. No reports of chest pain or palpitations. No abdominal pain. He does have nausea but no vomiting. No reports of lightheadedness or falls or loss of consciousness. No diarrhea or constipation. No blood in the stool or in the urine   in ED, patient was febrile with temp of 100.30F, heart rate 97, respiratory rate 27, blood pressure 98/59 which has improved to 134/52 with fluids. Blood work demonstrated hemoglobin of 8, platelet count 39. Chest x-ray demonstrated vascular congestion versus infectious etiology. Because of immunocompromised status pt was started on antibiotics for possible pneumonia.   Assessment & Plan    Principal Problem:   Acute respiratory failure with hypoxia (HCC) / Lobar pneumonia, unspecified organism ([place]) - Hypoxia likely secondary to combination of lung cancer and pneumonia - Respiratory status stable. - Continue oxygen support via nasal cannula to keep oxygen saturation above 90% - Continue duo neb as needed for shortness of breath or wheezing - We started empiric Levaquin for pneumonia. Pneumonia  order set place. Follow-up blood culture results, Legionella and strep pneumonia results. Follow-up respiratory culture results.  Active Problems:   Sepsis due to pneumonia ([place]) - Sepsis criteria met on the admission with fever, tachycardia, tachypnea and source of infection thought to be pneumonia. Chest x-ray on the admission showed progressive interstitial coarsening that could represent congestion versus infectious etiology - Patient was started on empiric Levaquin. Monitor QT interval while pt on Leavquin. - Pneumonia order set and sepsis order set placed - Follow-up blood culture results, Legionella, strep pneumonia and respiratory culture results - Follow-up lactic acid and pro-calcitonin level - Monitor on telemetry    Acute on chronic systolic and diastolic CHF / Ischemic cardiomyopathy / Automatic implantable cardioverter-defibrillator in situ - Last 2 D ECHO in 2014 with EF of 35% with grade 2 diastolic dysfunction - Will give dose of lasix 20 mg IV once - Monitor daily weight and strict intake and output    Atrial fibrillation (HCC) / Long-term (current) use of anticoagulants - CHADS vasc score 4 (age, CHF, hypertension, diabetes) - On anticoagulation with pradaxa. Pradaxa on hold due to thrombocytopenia. - Rate controlled with tikosyn    Small cell lung carcinoma (HCC) / Malignant neoplasm of bronchus of left upper lobe (HCC) - On chemotherapy and radiation therapy - Please let Dr. Julien Nordmann know pf pt admission tomorrow am    Malnutrition of moderate degree ([place]) - IN the context of chronic illness, malignancy - Nutrition consulted     Antineoplastic chemotherapy induced anemia / Thrombocytopenia (HCC) - Check daily CBC - Use SCD's for DVT prophylaxis - Hold pradaxa     Dyslipidemia associated with type 2 diabetes mellitus ([place]) - Continue omega 3 and  simvastatin     Uncontrolled diabetes mellitus with circulatory complication, with long-term current use of insulin  (HCC) - Check A1c - Continue insulin regimen but reduce dose of Levemir to 17 units at bedtime until appetite improves. (home dose of levemir is 34 units) - Added SSI  DVT prophylaxis:  - On pradaxa - Added SCD's  Radiological Exams on Admission: Dg Chest 2 View 01/09/2015  1. Progressive interstitial coarsening which could be congestive or infectious. 2. Known left upper lobe malignancy.   Code Status: Full Family Communication: Plan of care discussed with the patient  Disposition Plan: Admit for further evaluation  Leisa Lenz, MD  [facility] Pager [phone]  Time spent in minutes: 75 minutes  Review of Systems:  Constitutional: Negative for fever, chills and malaise/fatigue. Negative for diaphoresis.  HENT: Negative for hearing loss, ear pain, nosebleeds, congestion, sore throat, neck pain, tinnitus and ear discharge.   Eyes: Negative for blurred vision, double vision, photophobia, pain, discharge and redness.  Respiratory: per HPI   Cardiovascular: Negative for chest pain, palpitations, orthopnea, claudication and leg swelling.  Gastrointestinal: Negative for nausea, vomiting and abdominal pain. Negative for heartburn, constipation, blood in stool and melena.  Genitourinary: Negative for dysuria, urgency, frequency, hematuria and flank pain.  Musculoskeletal: Negative for myalgias, back pain, joint pain and falls.  Skin: Negative for itching and rash.  Neurological: Negative for dizziness and weakness. Negative for tingling, tremors, sensory change, speech change, focal weakness, loss of consciousness and headaches.  Endo/Heme/Allergies: Negative for environmental allergies and polydipsia. Does not bruise/bleed easily.  Psychiatric/Behavioral: Negative for suicidal ideas. The patient is not nervous/anxious.      Past Medical History  Diagnosis Date  . Coronary artery disease     status post CABG by Dr. Redmond Pulling in 1993 and status post percutaneous transluminal coronary  angioplasty and stenting by Dr. Wynonia Lawman in 2002 and 2006  . Carotid artery occlusion     severe left internal carotid artery stenosis, asymptomatic status post carotid endarterectomy  . Hypertensive heart disease without CHF   . Carotid artery disease   . Hyperlipidemia   . ICD (implantable cardiac defibrillator) in place   . CHF (congestive heart failure) ([place])   . Anginal pain ([place])   . Pacemaker   . Type 2 diabetes mellitus with vascular disease ([place]) 12/11/2008  . H/O hiatal hernia     "gone after my bypass"  . Arthritis     "neck, back, knees"  . Ischemic cardiomyopathy   . Atrial fibrillation ([place])   . Rheumatoid arthritis(714.0)   . AICD (automatic cardioverter/defibrillator) present   . 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 Allergy   . Lung cancer ([place]) 09/11/14    eft upper lobe lung  . Myocardial infarction ([place]) ~ 1992  . Antineoplastic chemotherapy induced anemia 12/24/2014  . Neutropenic fever ([place]) 10/11/2014  . Shortness of breath 10/24/2012    June 2014 full pulmonary function testing> ratio 86%, FEV1 2.91 L (96% predicted), no change with bronchodilator, total lung capacity 5.46 L (74% predicted) DLCO 15.09 (47% predicted) May 2014 CT chest>> there is no evidence of a pulmonary embolism, there is centrilobular emphysema seen throughout with significant paraseptal emphysema. There is also intralobular septal thickening and groundglass throughout the lungs and periphery in a fairly diffuse pattern. There no prior studies for further evaluation. 11/2012 CT Chest > no comment if progression from prior studies, persistent ground glass and interlobular septal thickening with emphysema  Past Surgical History  Procedure Laterality Date  . Carotid endarterectomy  2009    left  . Cardiac defibrillator placement  05/2006    St Jude   . Eye surgery  March 2013    Cataract Left eye  . Eye surgery  06/08/2011    Cataract Right eye  . Pr vein bypass graft,aorto-fem-pop    . Inguinal  hernia repair  1970's    bilaterally  . Hemorrhoid surgery  1970's  . Coronary angioplasty with stent placement      "I've got 2" (08/10/11)  . Cardioversion  08/2010  . Insert / replace / remove pacemaker  05/2006    "got a defibrillator/pacemaker" (08/10/11)  . Coronary artery bypass graft  08/1991    CABG X5  . Cardioversion  08/13/2011    Procedure: CARDIOVERSION;  Surgeon: Jacolyn Reedy, MD;  Location: [facility];  Service: Cardiovascular;  Laterality: N/A;  . Left heart catheterization with coronary/graft angiogram N/A 06/16/2012    Procedure: LEFT HEART CATHETERIZATION WITH Beatrix Fetters;  Surgeon: Jacolyn Reedy, MD;  Location: [facility] CATH LAB;  Service: Cardiovascular;  Laterality: N/A;  . Video bronchoscopy Bilateral 09/11/2014    Procedure: VIDEO BRONCHOSCOPY WITH FLUORO;  Surgeon: Juanito Doom, MD;  Location: [facility];  Service: Cardiopulmonary;  Laterality: Bilateral;  . Cataract surgery Bilateral   . Ep implantable device N/A 11/26/2014    Procedure:  ICD Generator Removal /Can only;  Surgeon: Evans Lance, MD;  Location: [facility] CV LAB;  Service: Cardiovascular;  Laterality: N/A;   Social History:  reports that he quit smoking about 34 years ago. His smoking use included Cigarettes. He has a 26 pack-year smoking history. He quit smokeless tobacco use about 21 months ago. His smokeless tobacco use included Chew. He reports that he drinks about 1.2 oz of alcohol per week. He reports that he does not use illicit drugs.  Allergies  Allergen Reactions  . Amiodarone Other (See Comments)    Tremor   . Rosiglitazone-Metformin Other (See Comments)    Unknown    Family History:  Family History  Problem Relation Age of Onset  . Diabetes Father   . Heart disease Father     Heart Disease before age 66  . Heart attack Father   . Stroke Mother      Prior to Admission medications   Medication Sig Start Date End Date Taking? Authorizing Provider  acetaminophen  (TYLENOL) 500 MG tablet Take 500 mg by mouth every 6 (six) hours as needed for mild pain, moderate pain or headache.   Yes Historical Provider, MD  carvedilol (COREG) 3.125 MG tablet Take 6.25 mg by mouth 2 (two) times daily with a meal.    Yes Historical Provider, MD  chlorpheniramine-HYDROcodone (TUSSIONEX) 10-8 MG/5ML SUER Take 5 mLs by mouth every 12 (twelve) hours as needed for cough. 01/04/15  Yes Kyung Rudd, MD  Cyanocobalamin (VITAMIN B-12) 2500 MCG SUBL Place 2,500 mcg under the tongue daily.    Yes Historical Provider, MD  dofetilide (TIKOSYN) 250 MCG capsule Take 250 mcg by mouth 2 (two) times daily.   Yes Historical Provider, MD  HUMALOG KWIKPEN 100 UNIT/ML KiwkPen Inject 4 Units into the skin 3 (three) times daily after meals.  09/04/14  Yes Historical Provider, MD  insulin detemir (LEVEMIR) 100 UNIT/ML injection Inject 34 Units into the skin daily.    Yes Historical Provider, MD  nitroGLYCERIN (NITROSTAT) 0.4 MG SL tablet Place 0.4 mg under the tongue every  5 (five) minutes as needed for chest pain.    Yes Historical Provider, MD  Omega-3 Fatty Acids (FISH OIL) 1000 MG CAPS Take 1,000 mg by mouth 2 (two) times daily.    Yes Historical Provider, MD  PRADAXA 150 MG CAPS capsule TAKE 1 CAPSULE BY MOUTH TWICE DAILY Patient taking differently: TAKE 150 MG  BY MOUTH TWICE DAILY 03/06/13  Yes Evans Lance, MD  simvastatin (ZOCOR) 40 MG tablet Take 40 mg by mouth daily.    Yes Historical Provider, MD  sucralfate (CARAFATE) 1 G tablet Take 1 tablet (1 g total) by mouth 4 (four) times daily. Patient not taking: Reported on 01/09/2015 12/26/14   Kyung Rudd, MD   Physical Exam: Filed Vitals:   01/09/15 1400 01/09/15 1430 01/09/15 1640 01/09/15 1735  BP: 134/52 120/62 106/42 98/59  Pulse: 85 86 78 77  Temp:      TempSrc:      Resp: _0 SpO2: 98% 98% 96% 97%    Physical Exam  Constitutional: Appears well-developed and well-nourished. No distress.  HENT: Normocephalic. No tonsillar  erythema or exudates Eyes: Conjunctivae are normal. No scleral icterus.  Neck: Normal ROM. Neck supple. No JVD. No tracheal deviation. No thyromegaly.  CVS: RRR, S1/S2 appreciated, basilar rales Pulmonary: rhonchrous, congested, diminished breath sounds, no wheezing.  Abdominal: Soft. BS +,  no distension, tenderness, rebound or guarding.  Musculoskeletal: Normal range of motion. No edema and no tenderness.  Lymphadenopathy: No lymphadenopathy noted, cervical, inguinal. Neuro: Alert. Normal reflexes, muscle tone coordination. No focal neurologic deficits. Skin: Skin is warm and dry. No rash noted.  No erythema. No pallor.  Psychiatric: Normal mood and affect. Behavior, judgment, thought content normal.   Labs on Admission:  Basic Metabolic Panel:  Recent Labs Lab 01/07/15 0952 01/09/15 1300  NA 137 137  K 4.0 3.9  CL  --  101  CO2 25 25  GLUCOSE 152* 111*  BUN 9.4 14  CREATININE 0.9 1.04  CALCIUM 8.9 8.6*  MG 1.6  --    Liver Function Tests:  Recent Labs Lab 01/07/15 0952  AST 13  ALT 17  ALKPHOS 111  BILITOT 0.48  PROT 6.4  ALBUMIN 3.0*   No results for input(s): LIPASE, AMYLASE in the last 168 hours. No results for input(s): AMMONIA in the last 168 hours. CBC:  Recent Labs Lab 01/07/15 0952 01/09/15 1300  WBC 7.6 8.9  NEUTROABS 6.3 8.0*  HGB 7.8* 8.0*  HCT 24.2* 24.6*  MCV 94.9 96.5  PLT 22* 39*   Cardiac Enzymes: No results for input(s): CKTOTAL, CKMB, CKMBINDEX, TROPONINI in the last 168 hours. BNP: Invalid input(s): POCBNP CBG:  Recent Labs Lab 01/09/15 1305  GLUCAP 101*    If 7PM-7AM, please contact night-coverage [email or web address] Password [email or web address] 01/09/2015, 5:59 PM

## 2015-01-10 ENCOUNTER — Other Ambulatory Visit: Payer: Medicare Other

## 2015-01-10 ENCOUNTER — Telehealth: Payer: Self-pay | Admitting: *Deleted

## 2015-01-10 ENCOUNTER — Ambulatory Visit
Admission: RE | Admit: 2015-01-10 | Discharge: 2015-01-10 | Disposition: A | Discharge status: Home / Self Care | Payer: Medicare Other | Source: Ambulatory Visit | Attending: Radiation Oncology | Admitting: Radiation Oncology

## 2015-01-10 VITALS — BP 151/77 | HR 97 | Temp 97.8°F

## 2015-01-10 DIAGNOSIS — E1159 Type 2 diabetes mellitus with other circulatory complications: Secondary | ICD-10-CM

## 2015-01-10 DIAGNOSIS — E1165 Type 2 diabetes mellitus with hyperglycemia: Secondary | ICD-10-CM

## 2015-01-10 DIAGNOSIS — C3490 Malignant neoplasm of unspecified part of unspecified bronchus or lung: Secondary | ICD-10-CM

## 2015-01-10 DIAGNOSIS — C3412 Malignant neoplasm of upper lobe, left bronchus or lung: Secondary | ICD-10-CM | POA: Diagnosis not present

## 2015-01-10 DIAGNOSIS — I480 Paroxysmal atrial fibrillation: Secondary | ICD-10-CM

## 2015-01-10 LAB — CBC
HCT: 21.6 % — ABNORMAL LOW (ref 39.0–52.0)
HEMOGLOBIN: 7.2 g/dL — AB (ref 13.0–17.0)
MCH: 31.6 pg (ref 26.0–34.0)
MCHC: 33.3 g/dL (ref 30.0–36.0)
MCV: 94.7 fL (ref 78.0–100.0)
PLATELETS: 41 10*3/uL — AB (ref 150–400)
RBC: 2.28 MIL/uL — AB (ref 4.22–5.81)
RDW: 16.7 % — ABNORMAL HIGH (ref 11.5–15.5)
WBC: 8 10*3/uL (ref 4.0–10.5)

## 2015-01-10 LAB — EXPECTORATED SPUTUM ASSESSMENT W REFEX TO RESP CULTURE

## 2015-01-10 LAB — BASIC METABOLIC PANEL
ANION GAP: 8 (ref 5–15)
BUN: 14 mg/dL (ref 6–20)
CHLORIDE: 102 mmol/L (ref 101–111)
CO2: 26 mmol/L (ref 22–32)
CREATININE: 0.96 mg/dL (ref 0.61–1.24)
Calcium: 8.4 mg/dL — ABNORMAL LOW (ref 8.9–10.3)
GFR calc non Af Amer: >60 mL/min (ref >60)
Glucose, Bld: 137 mg/dL — ABNORMAL HIGH (ref 65–99)
POTASSIUM: 3.5 mmol/L (ref 3.5–5.1)
SODIUM: 136 mmol/L (ref 135–145)

## 2015-01-10 LAB — EXPECTORATED SPUTUM ASSESSMENT W GRAM STAIN, RFLX TO RESP C

## 2015-01-10 LAB — GLUCOSE, CAPILLARY
GLUCOSE-CAPILLARY: 151 mg/dL — AB (ref 65–99)
GLUCOSE-CAPILLARY: 191 mg/dL — AB (ref 65–99)
Glucose-Capillary: 119 mg/dL — ABNORMAL HIGH (ref 65–99)
Glucose-Capillary: 128 mg/dL — ABNORMAL HIGH (ref 65–99)

## 2015-01-10 LAB — HIV ANTIBODY (ROUTINE TESTING W REFLEX): HIV SCREEN 4TH GENERATION: NONREACTIVE

## 2015-01-10 LAB — HEMOGLOBIN A1C
HEMOGLOBIN A1C: 7.5 % — AB (ref 4.8–5.6)
Mean Plasma Glucose: 169 mg/dL

## 2015-01-10 LAB — HEMOGLOBIN AND HEMATOCRIT, BLOOD
HEMATOCRIT: 24.5 % — AB (ref 39.0–52.0)
Hemoglobin: 8.2 g/dL — ABNORMAL LOW (ref 13.0–17.0)

## 2015-01-10 LAB — PREPARE RBC (CROSSMATCH)

## 2015-01-10 MED ORDER — FUROSEMIDE 10 MG/ML IJ SOLN
20.0000 mg | Freq: Once | INTRAMUSCULAR | Status: AC
  Administered 2015-01-10: 20 mg via INTRAVENOUS
  Filled 2015-01-10: qty 2

## 2015-01-10 MED ORDER — DOCUSATE SODIUM 100 MG PO CAPS
200.0000 mg | ORAL_CAPSULE | Freq: Two times a day (BID) | ORAL | Status: DC | PRN
  Administered 2015-01-10: 200 mg via ORAL
  Filled 2015-01-10: qty 2

## 2015-01-10 MED ORDER — SODIUM CHLORIDE 0.9 % IV SOLN
Freq: Once | INTRAVENOUS | Status: AC
  Administered 2015-01-10: 10 mL via INTRAVENOUS

## 2015-01-10 NOTE — Progress Notes (Signed)
[facility] Therapy Treatment Record Phone [phone]   Radiation Therapy was administered to Karl [name] on: 01/10/2015  10:51 AM and was treatment # 19 out of a planned course of 30 treatments.

## 2015-01-10 NOTE — Progress Notes (Signed)
Department of Radiation Oncology  Phone:  [phone] Fax:        [phone]  INPATIENT  Weekly Treatment Note    Name: Karl [name] Date: 01/10/2015 MRN: [id number]  DOB: April 20, 1935   Current dose: 38 Gy  Current fraction: 19   MEDICATIONS: No current facility-administered medications for this encounter.   No current outpatient prescriptions on file.   Facility-Administered Medications Ordered in Other Encounters  Medication Dose Route Frequency Provider Last Rate Last Dose  . acetaminophen (TYLENOL) tablet 500 mg  500 mg Oral Q6H PRN Robbie Lis, MD      . carvedilol (COREG) tablet 6.25 mg  6.25 mg Oral BID WC Robbie Lis, MD   6.25 mg at 01/10/15 0800  . chlorpheniramine-HYDROcodone (TUSSIONEX) 10-8 MG/5ML suspension 5 mL  5 mL Oral Q12H PRN Robbie Lis, MD      . dofetilide [facility]) capsule 250 mcg  250 mcg Oral BID Robbie Lis, MD   250 mcg at 01/10/15 1230  . furosemide (LASIX) injection 20 mg  20 mg Intravenous Once Robbie Lis, MD      . heparin lock flush 100 unit/mL  500 Units Intracatheter Once PRN Curt Bears, MD      . insulin aspart (novoLOG) injection 0-9 Units  0-9 Units Subcutaneous TID WC Robbie Lis, MD   0 Units at 01/10/15 0800  . insulin aspart (novoLOG) injection 4 Units  4 Units Subcutaneous TID PC Robbie Lis, MD   4 Units at 01/09/15 1830  . insulin detemir (LEVEMIR) injection 17 Units  17 Units Subcutaneous QHS Robbie Lis, MD   17 Units at 01/09/15 2246  . ipratropium (ATROVENT) nebulizer solution 0.5 mg  0.5 mg Nebulization Q6H PRN Robbie Lis, MD      . levalbuterol [facility]) nebulizer solution 0.63 mg  0.63 mg Nebulization Q6H PRN Robbie Lis, MD      . levofloxacin (LEVAQUIN) IVPB 750 mg  750 mg Intravenous Q24H Robbie Lis, MD      . LORazepam (ATIVAN) injection 0.5 mg  0.5 mg Intravenous PRN Curt Bears, MD      . omega-3 acid ethyl esters (LOVAZA) capsule 1 g  1 g Oral Daily Robbie Lis, MD   1 g at  01/10/15 1231  . simvastatin (ZOCOR) tablet 40 mg  40 mg Oral Daily Robbie Lis, MD   40 mg at 01/10/15 1230  . sodium chloride 0.9 % injection 10 mL  10 mL Intracatheter PRN Curt Bears, MD      . vitamin B-12 (CYANOCOBALAMIN) tablet 2,500 mcg  2,500 mcg Oral Daily Robbie Lis, MD   2,500 mcg at 01/10/15 1230     ALLERGIES: Amiodarone and Rosiglitazone-metformin   LABORATORY DATA:  Lab Results  Component Value Date   WBC 8.0 01/10/2015   HGB 7.2* 01/10/2015   HCT 21.6* 01/10/2015   MCV 94.7 01/10/2015   PLT 41* 01/10/2015   Lab Results  Component Value Date   NA 136 01/10/2015   K 3.5 01/10/2015   CL 102 01/10/2015   CO2 26 01/10/2015   Lab Results  Component Value Date   ALT 17 01/07/2015   AST 13 01/07/2015   ALKPHOS 111 01/07/2015   BILITOT 0.48 01/07/2015     NARRATIVE: Karl [name] was seen today for weekly treatment management. The chart was checked and the patient's films were reviewed.  Weekly  Rad tx left lung  19/30 txs completed. Karl [name] in hospital bed. He denies any pain and reports shortness of breath. Note O2 sat of 98%. His Hgb is 7.2 today he is scheduled for a transfusion today. HOB elevated. He started chemotherapy in September. He mentions that he has mucus in his throat.   ,  2:03 PM BP 151/77 mmHg  Pulse 97  Temp(Src) 97.8 F (36.6 C)  Wt   SpO2 100%  Wt Readings from Last 3 Encounters:  01/09/15 193 lb 9 oz (87.8 kg)  12/26/14 206 lb (93.441 kg)  12/24/14 200 lb 1.6 oz (90.765 kg)    PHYSICAL EXAMINATION: temperature is 97.8 F (36.6 C). His blood pressure is 151/77 and his pulse is 97. His oxygen saturation is 100%.        ASSESSMENT: The patient is doing satisfactorily with treatment.  PLAN: We will continue with the patient's radiation treatment as planned.       This document serves as a record of services personally performed by Tyler Pita, MD. It was created on his behalf by Lendon Collar, a trained  medical scribe. The creation of this record is based on the scribe's personal observations and the provider's statements to them. This document has been checked and approved by the attending provider.

## 2015-01-10 NOTE — Progress Notes (Signed)
Utilization review completed.  

## 2015-01-10 NOTE — Progress Notes (Addendum)
Patient ID: Karl [name], male   DOB: May 28, 1935, 79 y.o.   MRN: [id number] [facility] PROGRESS NOTE  SAGAR TENGAN RFF:[id number] DOB: 20-Mar-1935 DOA: 01/09/2015 PCP: [name], MD  Brief narrative:    79 year old male with past medical history of small cell lung cancer diagnosed in August 2016, by Dr. Lew Dawes care, status post systemic chemotherapy, radiation therapy under Dr. Lisbeth Renshaw, atrial fibrillation on anticoagulation with pradaxa, chronic combiend CHF (2 D ECHO in 2014 with EF of 35 % and grade 2 diastolic dysfunction), dyslipidemia, diabetes who presented to [facility] for radiation therapy. He was found to be hypoxic with oxygen saturation of about 90% on room air. Patient also reported associated shortness of breath, weakness, fatigue in addition to cough productive of grayish sputum and fevers at home.   in ED, patient was febrile with temp of 100.11F, heart rate 97, respiratory rate 27, blood pressure 98/59 which has improved to 134/52 with fluids. Blood work demonstrated hemoglobin of 8, platelet count 39. Chest x-ray demonstrated vascular congestion versus infectious etiology. Because of immunocompromised status pt was started on levaquin for possible pneumonia.   Anticipated discharge: 01/11/2015.  Assessment/Plan:    Principal Problem:  Acute respiratory failure with hypoxia (HCC) / Lobar pneumonia, unspecified organism ([place]) - Hypoxia likely due to combination of lung cancer and pneumonia - Continue oxygen support via nasal cannula to keep oxygen saturation above 90% - Continue duo neb as needed for shortness of breath or wheezing - Continue empiric Levaquin as noted below. Blood cultures negative so far  Active Problems:  Sepsis due to pneumonia ([place]) - Sepsis criteria met on the admission with fever, tachycardia, tachypnea. Lactic acid WNL. Procalcitonin was 1.94. - Source of infection thought to be pneumonia. Chest x-ray on the admission showed  progressive interstitial coarsening that could represent congestion versus infection - Continue Levaquin, will monitor QT interval while pt on Leavquin. - Blood cultures so far negative - Legionella, strep pneumonia and respiratory culture results - pending    Acute on chronic systolic and diastolic CHF / Ischemic cardiomyopathy / Automatic implantable cardioverter-defibrillator in situ - Last 2 D ECHO in 2014 with EF of 35% with grade 2 diastolic dysfunction - Given lasix 20 mg IV on admission and will get one dose 20 mg IV lasix today - Monitor daily weight - Continue strict intake and output   Atrial fibrillation (HCC) / Long-term (current) use of anticoagulants - CHADS vasc score 4 (age, CHF, hypertension, diabetes) - Pradaxa on hold due to thrombocytopenia. - Rate is controlled with tikosyn   Small cell lung carcinoma ([place]) / Malignant neoplasm of bronchus of left upper lobe ([place]) - On chemotherapy and radiation therapy - Please let Dr. Julien Nordmann know pf pt admission tomorrow am   Malnutrition of moderate degree ([place]) - In the context of chronic illness, malignancy - Diet as tolerated    Antineoplastic chemotherapy induced anemia / Thrombocytopenia ([place]) - Use SCD's for DVT prophylaxis - Hold pradaxa  - Platelets 41 today, will resume pradaxa if platelets are 50 or above    Dyslipidemia associated with type 2 diabetes mellitus ([place]) - Continue omega 3 and simvastatin    Uncontrolled diabetes mellitus with circulatory complication, with long-term current use of insulin (HCC) - A1c 7.5 on this admission - Continue current insulin regimen which is levemir 17 units daily and SSI - Pt also on novolog 4 units TID - CBG's in past 24 hours: 140, 119, 151  DVT prophylaxis:  - SCD's  Code Status: Full.  Family Communication:  plan of care discussed with the patient Disposition Plan: Home likely 01/11/2015   IV access:  Peripheral IV  Procedures and diagnostic studies:     Dg Chest 2 View 01/09/2015  1. Progressive interstitial coarsening which could be congestive or infectious. 2. Known left upper lobe malignancy. Electronically Signed   By: Monte Fantasia M.D.   On: 01/09/2015 15:06   Medical Consultants:  None   Other Consultants:  None   IAnti-Infectives:   Levaquin 01/09/2015 -->    Leisa Lenz, MD  [facility] Pager [phone]  Time spent in minutes: 25 minutes  If 7PM-7AM, please contact night-coverage [email or web address] Password [email or web address] 01/10/2015, 1:31 PM   LOS: 1 day    HPI/Subjective: No acute overnight events. Patient reports he feels better.   Objective: Filed Vitals:   01/10/15 0800 01/10/15 1234 01/10/15 1305 01/10/15 1320  BP: 140/74 139/74 131/46 138/51  Pulse: 94 101 96 99  Temp:  98.7 F (37.1 C) 98.7 F (37.1 C) 98.3 F (36.8 C)  TempSrc:      Resp:      Height:      Weight:      SpO2:  96% 98% 97%    Intake/Output Summary (Last 24 hours) at 01/10/15 1331 Last data filed at 01/10/15 0730  Gross per 24 hour  Intake    240 ml  Output   1200 ml  Net   -960 ml    Exam:   General:  Pt is alert, follows commands appropriately, not in acute distress  Cardiovascular: Regular rate and rhythm, S1/S2, no murmurs  Respiratory: coarse sounds bilaterally, no wheezing, no crackles, no rhonchi  Abdomen: Soft, non tender, non distended, bowel sounds present  Extremities: No edema, pulses DP and PT palpable bilaterally  Neuro: Grossly nonfocal  Data Reviewed: Basic Metabolic Panel:  Recent Labs Lab 01/07/15 0952 01/09/15 1300 01/10/15 0425  NA 137 137 136  K 4.0 3.9 3.5  CL  --  101 102  CO2 25 25 26   GLUCOSE 152* 111* 137*  BUN 9.4 14 14   CREATININE 0.9 1.04 0.96  CALCIUM 8.9 8.6* 8.4*  MG 1.6  --   --    Liver Function Tests:  Recent Labs Lab 01/07/15 0952  AST 13  ALT 17  ALKPHOS 111  BILITOT 0.48  PROT 6.4  ALBUMIN 3.0*   No results for input(s): LIPASE, AMYLASE in the last 168  hours. No results for input(s): AMMONIA in the last 168 hours. CBC:  Recent Labs Lab 01/07/15 0952 01/09/15 1300 01/10/15 0425  WBC 7.6 8.9 8.0  NEUTROABS 6.3 8.0*  --   HGB 7.8* 8.0* 7.2*  HCT 24.2* 24.6* 21.6*  MCV 94.9 96.5 94.7  PLT 22* 39* 41*   Cardiac Enzymes: No results for input(s): CKTOTAL, CKMB, CKMBINDEX, TROPONINI in the last 168 hours. BNP: Invalid input(s): POCBNP CBG:  Recent Labs Lab 01/09/15 1305 01/09/15 2117 01/10/15 0716 01/10/15 1144  GLUCAP 101* 140* 119* 151*    Recent Results (from the past 240 hour(s))  Urine culture     Status: None (Preliminary result)   Collection Time: 01/09/15  4:50 PM  Result Value Ref Range Status   Specimen Description URINE, CLEAN CATCH  Final   Special Requests Immunocompromised  Final   Culture   Final    TOO YOUNG TO READ Performed at [facility]    Report Status PENDING  Incomplete  Culture, sputum-assessment  Status: None   Collection Time: 01/10/15  3:52 AM  Result Value Ref Range Status   Specimen Description SPUTUM  Final   Special Requests NONE  Final   Sputum evaluation   Final    THIS SPECIMEN IS ACCEPTABLE. RESPIRATORY CULTURE REPORT TO FOLLOW.   Report Status 01/10/2015 FINAL  Final     Scheduled Meds: . carvedilol  6.25 mg Oral BID WC  . dofetilide  250 mcg Oral BID  . furosemide  20 mg Intravenous Once  . insulin aspart  0-9 Units Subcutaneous TID WC  . insulin aspart  4 Units Subcutaneous TID PC  . insulin detemir  17 Units Subcutaneous QHS  . levofloxacin (LEVAQUIN) IV  750 mg Intravenous Q24H  . omega-3 acid ethyl esters  1 g Oral Daily  . simvastatin  40 mg Oral Daily  . vitamin B-12  2,500 mcg Oral Daily   Continuous Infusions:

## 2015-01-10 NOTE — Progress Notes (Signed)
Initial Nutrition Assessment  DOCUMENTATION CODES:   Non-severe (moderate) malnutrition in context of acute illness/injury  INTERVENTION:  - Will monitor for PO intakes and talk with pt more extensively about nutrition supplements at follow-up - Encourage PO intakes at meals - RD will continue to monitor for needs  NUTRITION DIAGNOSIS:   Increased nutrient needs related to catabolic illness, cancer and cancer related treatments as evidenced by estimated needs.  GOAL:   Patient will meet greater than or equal to 90% of their needs  MONITOR:   PO intake, Weight trends, Labs, Skin, I & O's  REASON FOR ASSESSMENT:   Consult Assessment of nutrition requirement/status  ASSESSMENT:   79 year old male with past medical history of small cell lung cancer diagnosed in August 2016, by Dr. Lew Dawes care, status post systemic chemotherapy, radiation therapy under Dr. Lisbeth Renshaw, atrial fibrillation on anticoagulation with pradaxa, chronic combiend CHF (2 D ECHO in 2014 with EF of 35 % and grade 2 diastolic dysfunction), dyslipidemia, diabetes who presented to [facility] for radiation therapy. He was found to be hypoxic with oxygen saturation of about 90% on room air. Patient also reported associated shortness of breath, weakness, fatigue in addition to cough productive of grayish sputum, fevers at home. Patient also reported poor by mouth intake.   Pt seen for consult. BMI indicates overweight status. Pt went for radiation treatment earlier today and was preparing to take a nap at time of RD visit. Pt was willing to talk with RD and answer all questions at this time. He states that he had a few bites of a burger earlier but otherwise has not eaten today. Pt states that he has a poor appetite at this time and that this has been ongoing for 2-4 weeks PTA. He is undergoing radiation and states that he will have his last chemo treatment next week. He has experienced taste alteration throughout  treatment which presents as lack of taste. This is exacerbating decreased desire to eat. He denies ever having nausea or abdominal pain with PO intakes throughout treatment.   His wife is at bedside and states that she has bought Ensure for pt as well as Gatorade and juice, per his request, but that pt does not consume these items even with encouragement. Pt states that the items all taste good but he does not have the desire to consume them. Asked pt about swallowing and chewing difficulties which he denies. Wife states that pt has complained of intermittent swallowing problems. Pt then elaborates stating that sometimes he feels that items get stuck while he tries to swallow and that he has a difficult time getting items past this point. There are no items that seem to consistently cause this issue.   Pt states that in the past 1 week he has lost 5 lbs. Per chart review, pt has lost 13 lbs (6.3% body weight) in the past 2 weeks which is significant for time frame. Very mild muscle and fat wasting noted.   Pt not meeting needs. Will talk with him more in depth about nutrition supplements at next visit. Medications reviewed. Labs reviewed; CBGs: 101-140 mg/dL, HgbA1c: 7.5%, Ca: 8.4 mg/dL.   Diet Order:  Diet Heart Room service appropriate?: Yes; Fluid consistency:: Thin  Skin:  Reviewed, no issues  Last BM:  12/5  Height:   Ht Readings from Last 1 Encounters:  01/09/15 '5\' 11"'$  (1.803 m)    Weight:   Wt Readings from Last 1 Encounters:  01/09/15 193 lb 9 oz (  87.8 kg)    Ideal Body Weight:  78.18 kg (kg)  BMI:  Body mass index is 27.01 kg/(m^2).  Estimated Nutritional Needs:   Kcal:  2400-2600  Protein:  105-115 grams  Fluid:  2.2-2.4 L/day  EDUCATION NEEDS:   No education needs identified at this time     Jarome Matin, RD, LDN Inpatient Clinical Dietitian Pager # [phone] After hours/weekend pager # [phone]

## 2015-01-10 NOTE — Progress Notes (Signed)
Karl [name] in hospital bed.  He denies any pain and reports SOB. Note O2 sat of 98%.  His Hgb is 7.2 today he is scheduled for a transfusion today.   HOB elevated.    He has received 19 treatments to his left lung .

## 2015-01-10 NOTE — Progress Notes (Signed)
PT Cancellation Note  Patient Details Name: LYNDEL SARATE MRN: [id number] DOB: 1935-06-03   Cancelled Treatment:    Reason Eval/Treat Not Completed: Medical issues which prohibited therapy (low HGB, to get blood today.)   Marcelino Freestone PT [phone]  01/10/2015, 10:57 AM

## 2015-01-10 NOTE — Telephone Encounter (Signed)
Called 4E room 1418 of patient spoke with RN Amber,asked if patient able to come for rad tx today? Amber stated she didn't see why not that he couldn't come for radiation,  Saw where his hgb 7.2, asked if getting PRBC'S today?, No order as yet, she will contact Dr.  And ill call back status, , thanked RN called and spoke with Threasa Beards, RT Therapist and gave updated status of patient,

## 2015-01-11 ENCOUNTER — Ambulatory Visit
Admission: RE | Admit: 2015-01-11 | Discharge: 2015-01-11 | Disposition: A | Discharge status: Home / Self Care | Payer: Medicare Other | Source: Ambulatory Visit | Attending: Radiation Oncology | Admitting: Radiation Oncology

## 2015-01-11 ENCOUNTER — Encounter: Payer: Self-pay | Admitting: Radiation Oncology

## 2015-01-11 DIAGNOSIS — C3412 Malignant neoplasm of upper lobe, left bronchus or lung: Secondary | ICD-10-CM | POA: Diagnosis not present

## 2015-01-11 LAB — CBC
HCT: 23.6 % — ABNORMAL LOW (ref 39.0–52.0)
Hemoglobin: 7.8 g/dL — ABNORMAL LOW (ref 13.0–17.0)
MCH: 30.8 pg (ref 26.0–34.0)
MCHC: 33.1 g/dL (ref 30.0–36.0)
MCV: 93.3 fL (ref 78.0–100.0)
PLATELETS: 54 10*3/uL — AB (ref 150–400)
RBC: 2.53 MIL/uL — AB (ref 4.22–5.81)
RDW: 16.6 % — AB (ref 11.5–15.5)
WBC: 6.8 10*3/uL (ref 4.0–10.5)

## 2015-01-11 LAB — GLUCOSE, CAPILLARY
GLUCOSE-CAPILLARY: 124 mg/dL — AB (ref 65–99)
Glucose-Capillary: 186 mg/dL — ABNORMAL HIGH (ref 65–99)

## 2015-01-11 LAB — URINE CULTURE

## 2015-01-11 LAB — PREPARE RBC (CROSSMATCH)

## 2015-01-11 MED ORDER — AMOXICILLIN-POT CLAVULANATE 875-125 MG PO TABS: 1.0000 | ORAL_TABLET | Freq: Two times a day (BID) | ORAL | Status: DC

## 2015-01-11 MED ORDER — GUAIFENESIN ER 600 MG PO TB12
600.0000 mg | ORAL_TABLET | Freq: Two times a day (BID) | ORAL | Status: DC
  Administered 2015-01-11: 600 mg via ORAL
  Filled 2015-01-11: qty 1

## 2015-01-11 MED ORDER — SODIUM CHLORIDE 0.9 % IV SOLN
Freq: Once | INTRAVENOUS | Status: AC
Start: 2015-01-11 — End: 2015-01-11
  Administered 2015-01-11: 14:00:00 via INTRAVENOUS

## 2015-01-11 MED ORDER — INSULIN DETEMIR 100 UNIT/ML ~~LOC~~ SOLN: 20.0000 [IU] | Freq: Every day | SUBCUTANEOUS | Status: AC

## 2015-01-11 NOTE — Progress Notes (Signed)
PT Cancellation Note  Patient Details Name: Karl [name] MRN: [id number] DOB: 04-23-1935   Cancelled Treatment:    Reason Eval/Treat Not Completed: Attempted PT eval-pt declined to participate at this time. Will check back as schedule allows.   Weston Anna, MPT Pager: [phone]

## 2015-01-11 NOTE — Discharge Instructions (Signed)

## 2015-01-11 NOTE — Evaluation (Signed)
Physical Therapy Evaluation Patient Details Name: Karl [name] MRN: [id number] DOB: October 15, 1935 Today's Date: 01/11/2015   History of Present Illness  79 yo male admitted with acute respiratory failure. Hx of lung cancer, HTN, DM, CAD, CHF, AICD, A fib, RA.   Clinical Impression  On eval, pt was Min guard assist for mobility-walked ~100 feet with intermittent "furniture walking" noted. O2 sats 91% on RA. Pt tolerated activity fairly well. Slightly unsteady but no LOB during session.     Follow Up Recommendations No PT follow up;Supervision - Intermittent;Supervision for OOB/mobility    Equipment Recommendations  None recommended by PT    Recommendations for Other Services       Precautions / Restrictions Precautions Precautions: Fall      Mobility  Bed Mobility Overal bed mobility: Needs Assistance Bed Mobility: Supine to Sit;Sit to Supine     Supine to sit: Modified independent (Device/Increase time);HOB elevated Sit to supine: Modified independent (Device/Increase time);HOB elevated      Transfers Overall transfer level: Needs assistance Equipment used: Rolling walker (2 wheeled) Transfers: Sit to/from Stand           General transfer comment: close guard for safety.   Ambulation/Gait Ambulation/Gait assistance: Min guard Ambulation Distance (Feet): 100 Feet Assistive device:  (intermittent furniture walking) Gait Pattern/deviations: Decreased stride length;Step-through pattern     General Gait Details: slow gait speed. brief standing rest break after ~50 feet. intermittent furniture walking. O2 sats 91% on RA.  Stairs            Wheelchair Mobility    Modified Rankin (Stroke Patients Only)       Balance Overall balance assessment: Needs assistance         Standing balance support: During functional activity Standing balance-Leahy Scale: Fair                               Pertinent Vitals/Pain Pain Assessment: No/denies  pain    Home Living Family/patient expects to be discharged to:: Private residence Living Arrangements: Spouse/significant other Available Help at Discharge: Family Type of Home: House Home Access: Stairs to enter Entrance Stairs-Rails: None Technical brewer of Steps: 1 Home Layout: One level Home Equipment: None      Prior Function Level of Independence: Independent               Hand Dominance        Extremity/Trunk Assessment   Upper Extremity Assessment: Overall WFL for tasks assessed           Lower Extremity Assessment: Generalized weakness      Cervical / Trunk Assessment: Kyphotic  Communication   Communication: No difficulties  Cognition Arousal/Alertness: Awake/alert Behavior During Therapy: WFL for tasks assessed/performed Overall Cognitive Status: Within Functional Limits for tasks assessed                      General Comments      Exercises        Assessment/Plan    PT Assessment Patient needs continued PT services  PT Diagnosis Difficulty walking;Generalized weakness   PT Problem List Decreased mobility;Decreased balance;Decreased activity tolerance  PT Treatment Interventions Gait training;Functional mobility training;Therapeutic activities;Patient/family education;Therapeutic exercise   PT Goals (Current goals can be found in the Care Plan section) Acute Rehab PT Goals Patient Stated Goal: home later today PT Goal Formulation: With patient/family Time For Goal Achievement: 01/25/15 Potential to Achieve Goals: Good  Frequency Min 3X/week   Barriers to discharge        Co-evaluation               End of Session   Activity Tolerance: Patient tolerated treatment well Patient left: with call bell/phone within reach;with family/visitor present           Time: 1137-1145 PT Time Calculation (min) (ACUTE ONLY): 8 min   Charges:   PT Evaluation $Initial PT Evaluation Tier I: 1 Procedure     PT  G Codes:        Weston Anna, MPT Pager: [phone]

## 2015-01-11 NOTE — Progress Notes (Signed)
Patient discharged home with family, discharge instructions given and explained to patient/family and they verbalized understanding, denies any pain/distress. Accompanied home by family. No wound noted, skin intact.

## 2015-01-11 NOTE — Discharge Summary (Signed)
Physician Discharge Summary  Karl [name] BLT:[id number] DOB: [birth year] DOA: 01/09/2015  PCP: [name], MD  Admit date: 01/09/2015 Discharge date: 01/11/2015  Recommendations for Outpatient Follow-up:  1. Continue Augmentin for 7 days on discharge 2. 6 platelet count is 54 it is okay to resume Pradaxa but patient needs to have recheck blood work on Monday, 01/14/2015 and is it is lower than 50 been it may need to be held.  Discharge Diagnoses:  Principal Problem:   Acute respiratory failure with hypoxia (HCC) Active Problems:   Lobar pneumonia, unspecified organism ([place])   Sepsis due to pneumonia [facility])   Ischemic cardiomyopathy   Atrial fibrillation ([place])   Automatic implantable cardioverter-defibrillator in situ   Long-term (current) use of anticoagulants   Small cell lung carcinoma (HCC)   Malnutrition of moderate degree (HCC)   Malignant neoplasm of bronchus of left upper lobe (HCC)   Antineoplastic chemotherapy induced anemia   Thrombocytopenia (HCC)   Dyslipidemia associated with type 2 diabetes mellitus ([place])   Uncontrolled diabetes mellitus with circulatory complication, with long-term current use of insulin (HCC)   Acute on chronic combined systolic and diastolic CHF (congestive heart failure) ([place])    Discharge Condition: stable   Diet recommendation: as tolerated   History of present illness:  79 year old male with past medical history of small cell lung cancer diagnosed in August 2016, by Dr. Lew Dawes care, status post systemic chemotherapy, radiation therapy under Dr. Lisbeth Renshaw, atrial fibrillation on anticoagulation with pradaxa, chronic combiend CHF (2 D ECHO in 2014 with EF of 35 % and grade 2 diastolic dysfunction), dyslipidemia, diabetes who presented to [place] for radiation therapy. He was found to be hypoxic with oxygen saturation of about 90% on room air. Patient also reported associated shortness of breath, weakness, fatigue in addition to cough  productive of grayish sputum and fevers at home.   in ED, patient was febrile with temp of 100.3F, heart rate 97, respiratory rate 27, blood pressure 98/59 which has improved to 134/52 with fluids. Blood work demonstrated hemoglobin of 8, platelet count 39. Chest x-ray demonstrated vascular congestion versus infectious etiology. Because of immunocompromised status pt was started on levaquin for possible pneumonia.   Hospital Course:   Assessment/Plan:    Principal Problem:  Acute respiratory failure with hypoxia (HCC) / Lobar pneumonia, unspecified organism ([place]) - Hypoxia likely due to combination of lung cancer and pneumonia - Respiratory status is stable - Patient has received Levaquin during this hospital stay. Blood cultures to date are negative. - Patient will continue Augmentin for 7 days on discharge.  Active Problems:  Sepsis due to pneumonia ([place]) - Sepsis criteria met on the admission with fever, tachycardia, tachypnea. Lactic acid WNL. Procalcitonin was 1.94. - Pneumonia presumed to be the source of infection. Chest x-ray on the admission demonstrated progressive interstitial coarsening which could be congestion or infection. - As noted above, he has received Levaquin in hospital and he will continue Augmentin on discharge for 7 days. - Blood cultures are negative - Respiratory culture grew normal flora - Strep pneumonia and legionella were ordered but somehow not collected on the admission.   Acute on chronic systolic and diastolic CHF / Ischemic cardiomyopathy / Automatic implantable cardioverter-defibrillator in situ - Last 2 D ECHO in 2014 with EF of 35% with grade 2 diastolic dysfunction - Given lasix 20 mg IV on admission and on 01/10/2015 - Stable respiratory status   Atrial fibrillation ([place]) / Long-term (current) use of anticoagulants - CHADS vasc  score 4 (age, CHF, hypertension, diabetes) - Pradaxa on hold due to thrombocytopenia. We resumed Pradaxa on  discharge because platelet count is 54 with the instruction that during the blood work on Monday, 01/14/2015 if it drops below 50 to continue to hold per DEXA. - Rate is controlled with tikosyn   Small cell lung carcinoma ([place]) / Malignant neoplasm of bronchus of left upper lobe ([place]) - On chemotherapy and radiation therapy - Patient has appointment in [place]., January 14 2015 at 10 AM. He is aware of this appointment.   Malnutrition of moderate degree (HCC) - In the context of chronic illness, malignancy - Diet as tolerated    Antineoplastic chemotherapy induced anemia / Thrombocytopenia ([place]) - Use SCD's for DVT prophylaxis - Pradaxa resumed since platelet count is 54.   Dyslipidemia associated with type 2 diabetes mellitus (HCC) - Continue omega 3 and simvastatin    Uncontrolled diabetes mellitus with circulatory complication, with long-term current use of insulin (HCC) - A1c 7.5 on this admission - CBGs are in 120s range so we'll resume insulin however at 20 units a day instead of 34 units a day.   DVT prophylaxis:  - SCD's due to risk of bleeding and thrombocytopenia  Code Status: Full.  Family Communication: plan of care discussed with the patient   IV access:  Peripheral IV  Procedures and diagnostic studies:   Dg Chest 2 View 01/09/2015 1. Progressive interstitial coarsening which could be congestive or infectious. 2. Known left upper lobe malignancy. Electronically Signed By: Karl [name] M.D. On: 01/09/2015 15:06   Medical Consultants:  None   Other Consultants:  None   IAnti-Infectives:   Levaquin 01/09/2015 --> 01/11/2015   Signed:  Leisa Lenz, MD  [facility] 01/11/2015, 11:48 AM  Pager #: [phone]  Time spent in minutes: more than 30 minutes  Discharge Exam: Filed Vitals:   01/11/15 0540 01/11/15 0916  BP: 110/72 124/68  Pulse: 94 93  Temp: 98.1 F (36.7 C)   Resp: 20    Filed Vitals:    01/10/15 1615 01/10/15 2039 01/11/15 0540 01/11/15 0916  BP: 132/57 108/51 110/72 124/68  Pulse: 104 76 94 93  Temp: 98.7 F (37.1 C) 98.5 F (36.9 C) 98.1 F (36.7 C)   TempSrc:  Oral Oral   Resp:  22 20   Height:      Weight:      SpO2: 96% 99% 96%     General: Pt is alert, follows commands appropriately, not in acute distress Cardiovascular: Regular rate and rhythm, S1/S2 appreciated  Respiratory: Clear to auscultation bilaterally, no wheezing, no crackles, no rhonchi Abdominal: Soft, non tender, non distended, bowel sounds +, no guarding Extremities: no edema, no cyanosis, pulses palpable bilaterally DP and PT Neuro: Grossly nonfocal  Discharge Instructions  Discharge Instructions    Call MD for:  difficulty breathing, headache or visual disturbances    Complete by:  As directed      Call MD for:  persistant dizziness or light-headedness    Complete by:  As directed      Call MD for:  persistant nausea and vomiting    Complete by:  As directed      Call MD for:  severe uncontrolled pain    Complete by:  As directed      Diet - low sodium heart healthy    Complete by:  As directed      Discharge instructions    Complete by:  As directed  Please continue Augmentin on discharge for 7 days Your appt with Dr. Julien Nordmann 01/14/2015 at 10 am.     Increase activity slowly    Complete by:  As directed             Medication List    STOP taking these medications        sucralfate 1 G tablet  Commonly known as:  CARAFATE      TAKE these medications        acetaminophen 500 MG tablet  Commonly known as:  TYLENOL  Take 500 mg by mouth every 6 (six) hours as needed for mild pain, moderate pain or headache.     amoxicillin-clavulanate 875-125 MG tablet  Commonly known as:  AUGMENTIN  Take 1 tablet by mouth 2 (two) times daily.     carvedilol 3.125 MG tablet  Commonly known as:  COREG  Take 6.25 mg by mouth 2 (two) times daily with a meal.      chlorpheniramine-HYDROcodone 10-8 MG/5ML Suer  Commonly known as:  TUSSIONEX  Take 5 mLs by mouth every 12 (twelve) hours as needed for cough.     dofetilide 250 MCG capsule  Commonly known as:  TIKOSYN  Take 250 mcg by mouth 2 (two) times daily.     Fish Oil 1000 MG Caps  Take 1,000 mg by mouth 2 (two) times daily.     HUMALOG KWIKPEN 100 UNIT/ML KiwkPen  Generic drug:  insulin lispro  Inject 4 Units into the skin 3 (three) times daily after meals.     insulin detemir 100 UNIT/ML injection  Commonly known as:  LEVEMIR  Inject 0.2 mLs (20 Units total) into the skin daily.     nitroGLYCERIN 0.4 MG SL tablet  Commonly known as:  NITROSTAT  Place 0.4 mg under the tongue every 5 (five) minutes as needed for chest pain.     PRADAXA 150 MG Caps capsule  Generic drug:  dabigatran  TAKE 1 CAPSULE BY MOUTH TWICE DAILY     simvastatin 40 MG tablet  Commonly known as:  ZOCOR  Take 40 mg by mouth daily.     Vitamin B-12 2500 MCG Subl  Place 2,500 mcg under the tongue daily.            Follow-up Information    Follow up with [name], MD. Schedule an appointment as soon as possible for a visit in 1 week.   Specialty:  Internal Medicine   Why:  Follow up appt after recent hospitalization   Contact information:   [address] [address] [phone]        The results of significant diagnostics from this hospitalization (including imaging, microbiology, ancillary and laboratory) are listed below for reference.    Significant Diagnostic Studies: Dg Chest 2 View  01/09/2015  CLINICAL DATA:  Increasing cough for 1 week. Lung cancer under treatment. EXAM: CHEST  2 VIEW COMPARISON:  10/11/2014 FINDINGS: Generator pack has been removed since prior. Unchanged positioning of single chamber ICD/ pacer lead into the right ventricle. Unchanged cardiomegaly.  There is been CABG and coronary stenting. Chronic interstitial coarsening which is diffusely increased from  prior. There is a focal opacity in the left upper lobe which correlates with lung cancer and surrounding airspace disease. IMPRESSION: 1. Progressive interstitial coarsening which could be congestive or infectious. 2. Known left upper lobe malignancy. Electronically Signed   By: Karl [name] M.D.   On: 01/09/2015 15:06   Ct Chest W  Contrast  12/21/2014  CLINICAL DATA:  Subsequent encounter for small-cell lung cancer. EXAM: CT CHEST, ABDOMEN, AND PELVIS WITH CONTRAST TECHNIQUE: Multidetector CT imaging of the chest, abdomen and pelvis was performed following the standard protocol during bolus administration of intravenous contrast. CONTRAST:  166m OMNIPAQUE IOHEXOL 300 MG/ML  SOLN COMPARISON:  Chest CT from 11/09/2014.  PET-CT from 09/28/2014. FINDINGS: The power injector rate limited for this study resulting in delayed imaging after IV contrast administration. CT CHEST FINDINGS Mediastinum/Nodes: There is no axillary lymphadenopathy. Scattered mediastinal lymph nodes are again noted. The precarinal lymph node measured previously at 14 mm short axis is stable a 14 mm today. A tiny AP window lymph node measured previously at 7 mm short axis is 6 mm short axis today. Small lymph nodes in the right hilum are stable. Heart size is mildly enlarged. Patient is status post CABG. No pericardial effusion. The esophagus has normal imaging features. Left-sided permanent pacemaker again noted. Lungs/Pleura: Peripheral left upper lobe pulmonary mass shows further decrease in size, measuring 1.8 x 3.6 cm today compared to 2.0 x 4.0 cm previously. The adjacent interstitial and alveolar opacity tracking centrally towards the hilum and extending anteromedially in the left lung is presumably related to a combination of treated disease and radiation change. Scattered chronic diffuse interstitial lung disease with a peripheral predominance is again noted. Small bilateral pleural effusions are new in the interval.  Musculoskeletal: Bone windows reveal no worrisome lytic or sclerotic osseous lesions. CT ABDOMEN PELVIS FINDINGS Hepatobiliary: No focal abnormality within the liver parenchyma. There is no evidence for gallstones, gallbladder wall thickening, or pericholecystic fluid. No intrahepatic or extrahepatic biliary dilation. Pancreas: No focal mass lesion. No dilatation of the main duct. No intraparenchymal cyst. No peripancreatic edema. Spleen: No splenomegaly. No focal mass lesion. Adrenals/Urinary Tract: No adrenal nodule or mass. 10 mm exophytic lesion in the interpolar right kidney is unchanged and is likely a cyst. Scarring is evident in the upper pole of the left kidney. No evidence for hydroureter. The urinary bladder appears normal for the degree of distention. Stomach/Bowel: Stomach is nondistended. No gastric wall thickening. No evidence of outlet obstruction. Duodenum is normally positioned as is the ligament of Treitz. No small bowel wall thickening. No small bowel dilatation. The terminal ileum is normal. The appendix is normal. Diverticuli are seen scattered along the entire length of the colon without CT findings of diverticulitis. Vascular/Lymphatic: There is abdominal aortic atherosclerosis without aneurysm. There is no gastrohepatic or hepatoduodenal ligament lymphadenopathy. No intraperitoneal or retroperitoneal lymphadenopy. No pelvic sidewall lymphadenopathy. Reproductive: The prostate gland and seminal vesicles have normal imaging features. Other: No intraperitoneal free fluid. Musculoskeletal: Bilateral inguinal hernias contain only fat Bone windows reveal no worrisome lytic or sclerotic osseous lesions. IMPRESSION: 1. Interval decrease in size of the dominant peripheral left upper lobe pulmonary mass with persistent interstitial and airspace disease medially in the left lung. 2. Interval development of small bilateral pleural effusions. 3. Stable appearance of scattered borderline enlarged  mediastinal lymph nodes. 4. No evidence for metastatic disease in the abdomen or pelvis. Imaging findings of potential clinical significance: Abdominal aortic atherosclerosis. Electronically Signed   By: EMisty StanleyM.D.   On: 12/21/2014 13:45   Ct Abdomen Pelvis W Contrast  12/21/2014  CLINICAL DATA:  Subsequent encounter for small-cell lung cancer. EXAM: CT CHEST, ABDOMEN, AND PELVIS WITH CONTRAST TECHNIQUE: Multidetector CT imaging of the chest, abdomen and pelvis was performed following the standard protocol during bolus administration of intravenous contrast. CONTRAST:  165m OMNIPAQUE IOHEXOL 300 MG/ML  SOLN COMPARISON:  Chest CT from 11/09/2014.  PET-CT from 09/28/2014. FINDINGS: The power injector rate limited for this study resulting in delayed imaging after IV contrast administration. CT CHEST FINDINGS Mediastinum/Nodes: There is no axillary lymphadenopathy. Scattered mediastinal lymph nodes are again noted. The precarinal lymph node measured previously at 14 mm short axis is stable a 14 mm today. A tiny AP window lymph node measured previously at 7 mm short axis is 6 mm short axis today. Small lymph nodes in the right hilum are stable. Heart size is mildly enlarged. Patient is status post CABG. No pericardial effusion. The esophagus has normal imaging features. Left-sided permanent pacemaker again noted. Lungs/Pleura: Peripheral left upper lobe pulmonary mass shows further decrease in size, measuring 1.8 x 3.6 cm today compared to 2.0 x 4.0 cm previously. The adjacent interstitial and alveolar opacity tracking centrally towards the hilum and extending anteromedially in the left lung is presumably related to a combination of treated disease and radiation change. Scattered chronic diffuse interstitial lung disease with a peripheral predominance is again noted. Small bilateral pleural effusions are new in the interval. Musculoskeletal: Bone windows reveal no worrisome lytic or sclerotic osseous  lesions. CT ABDOMEN PELVIS FINDINGS Hepatobiliary: No focal abnormality within the liver parenchyma. There is no evidence for gallstones, gallbladder wall thickening, or pericholecystic fluid. No intrahepatic or extrahepatic biliary dilation. Pancreas: No focal mass lesion. No dilatation of the main duct. No intraparenchymal cyst. No peripancreatic edema. Spleen: No splenomegaly. No focal mass lesion. Adrenals/Urinary Tract: No adrenal nodule or mass. 10 mm exophytic lesion in the interpolar right kidney is unchanged and is likely a cyst. Scarring is evident in the upper pole of the left kidney. No evidence for hydroureter. The urinary bladder appears normal for the degree of distention. Stomach/Bowel: Stomach is nondistended. No gastric wall thickening. No evidence of outlet obstruction. Duodenum is normally positioned as is the ligament of Treitz. No small bowel wall thickening. No small bowel dilatation. The terminal ileum is normal. The appendix is normal. Diverticuli are seen scattered along the entire length of the colon without CT findings of diverticulitis. Vascular/Lymphatic: There is abdominal aortic atherosclerosis without aneurysm. There is no gastrohepatic or hepatoduodenal ligament lymphadenopathy. No intraperitoneal or retroperitoneal lymphadenopy. No pelvic sidewall lymphadenopathy. Reproductive: The prostate gland and seminal vesicles have normal imaging features. Other: No intraperitoneal free fluid. Musculoskeletal: Bilateral inguinal hernias contain only fat Bone windows reveal no worrisome lytic or sclerotic osseous lesions. IMPRESSION: 1. Interval decrease in size of the dominant peripheral left upper lobe pulmonary mass with persistent interstitial and airspace disease medially in the left lung. 2. Interval development of small bilateral pleural effusions. 3. Stable appearance of scattered borderline enlarged mediastinal lymph nodes. 4. No evidence for metastatic disease in the abdomen or  pelvis. Imaging findings of potential clinical significance: Abdominal aortic atherosclerosis. Electronically Signed   By: EMisty StanleyM.D.   On: 12/21/2014 13:45    Microbiology: Recent Results (from the past 240 hour(s))  Culture, blood (routine x 2)     Status: None (Preliminary result)   Collection Time: 01/09/15  2:35 PM  Result Value Ref Range Status   Specimen Description BLOOD LEFT HAND  Final   Special Requests BOTTLES DRAWN AEROBIC AND ANAEROBIC 5CC EACH  Final   Culture   Final    NO GROWTH < 24 HOURS [facility]   Report Status PENDING  Incomplete  Culture, blood (routine x 2)  Status: None (Preliminary result)   Collection Time: 01/09/15  2:38 PM  Result Value Ref Range Status   Specimen Description BLOOD RIGHT ANTECUBITAL  Final   Special Requests BOTTLES DRAWN AEROBIC AND ANAEROBIC 5CC EACH  Final   Culture   Final    NO GROWTH < 24 HOURS Performed at [facility]    Report Status PENDING  Incomplete  Urine culture     Status: None (Preliminary result)   Collection Time: 01/09/15  4:50 PM  Result Value Ref Range Status   Specimen Description URINE, CLEAN CATCH  Final   Special Requests Immunocompromised  Final   Culture   Final    TOO YOUNG TO READ Performed at [facility]    Report Status PENDING  Incomplete  Culture, sputum-assessment     Status: None   Collection Time: 01/10/15  3:52 AM  Result Value Ref Range Status   Specimen Description SPUTUM  Final   Special Requests NONE  Final   Sputum evaluation   Final    THIS SPECIMEN IS ACCEPTABLE. RESPIRATORY CULTURE REPORT TO FOLLOW.   Report Status 01/10/2015 FINAL  Final  Culture, respiratory (NON-Expectorated)     Status: None (Preliminary result)   Collection Time: 01/10/15  3:52 AM  Result Value Ref Range Status   Specimen Description SPUTUM  Final   Special Requests NONE  Final   Gram Stain   Final    RARE WBC PRESENT,BOTH PMN AND MONONUCLEAR FEW SQUAMOUS  EPITHELIAL CELLS PRESENT FEW GRAM POSITIVE COCCI IN PAIRS IN CLUSTERS FEW GRAM POSITIVE RODS THIS SPECIMEN IS ACCEPTABLE FOR SPUTUM CULTURE    Culture   Final    NORMAL OROPHARYNGEAL FLORA Performed at [facility]    Report Status PENDING  Incomplete     Labs: Basic Metabolic Panel:  Recent Labs Lab 01/07/15 0952 01/09/15 1300 01/10/15 0425  NA 137 137 136  K 4.0 3.9 3.5  CL  --  101 102  CO2 25 25 26   GLUCOSE 152* 111* 137*  BUN 9.4 14 14   CREATININE 0.9 1.04 0.96  CALCIUM 8.9 8.6* 8.4*  MG 1.6  --   --    Liver Function Tests:  Recent Labs Lab 01/07/15 0952  AST 13  ALT 17  ALKPHOS 111  BILITOT 0.48  PROT 6.4  ALBUMIN 3.0*   No results for input(s): LIPASE, AMYLASE in the last 168 hours. No results for input(s): AMMONIA in the last 168 hours. CBC:  Recent Labs Lab 01/07/15 0952 01/09/15 1300 01/10/15 0425 01/10/15 1858 01/11/15 0500  WBC 7.6 8.9 8.0  --  6.8  NEUTROABS 6.3 8.0*  --   --   --   HGB 7.8* 8.0* 7.2* 8.2* 7.8*  HCT 24.2* 24.6* 21.6* 24.5* 23.6*  MCV 94.9 96.5 94.7  --  93.3  PLT 22* 39* 41*  --  54*   Cardiac Enzymes: No results for input(s): CKTOTAL, CKMB, CKMBINDEX, TROPONINI in the last 168 hours. BNP: BNP (last 3 results)  Recent Labs  10/12/14 0353 01/09/15 1300  BNP 412.0* 619.2*    ProBNP (last 3 results) No results for input(s): PROBNP in the last 8760 hours.  CBG:  Recent Labs Lab 01/10/15 0716 01/10/15 1144 01/10/15 1703 01/10/15 2136 01/11/15 0735  GLUCAP 119* 151* 191* 128* 124*

## 2015-01-11 NOTE — Progress Notes (Signed)
1 unit PRBC to be given prior to discharge. Karl [name] [facility] [phone]

## 2015-01-11 NOTE — Progress Notes (Signed)
[facility] Therapy Treatment Record Phone [phone]   Radiation Therapy was administered to Karl [name] on: 01/11/2015  12:25 PM and was treatment # 20 out of a planned course of 30 treatments.

## 2015-01-12 LAB — CULTURE, RESPIRATORY W GRAM STAIN

## 2015-01-12 LAB — CULTURE, RESPIRATORY: CULTURE: NORMAL

## 2015-01-13 LAB — TYPE AND SCREEN
ABO/RH(D): A POS
ANTIBODY SCREEN: NEGATIVE
UNIT DIVISION: 0
Unit division: 0

## 2015-01-14 ENCOUNTER — Ambulatory Visit
Admission: RE | Admit: 2015-01-14 | Discharge: 2015-01-14 | Disposition: A | Discharge status: Home / Self Care | Payer: Medicare Other | Source: Ambulatory Visit | Attending: Radiation Oncology | Admitting: Radiation Oncology

## 2015-01-14 ENCOUNTER — Other Ambulatory Visit (HOSPITAL_BASED_OUTPATIENT_CLINIC_OR_DEPARTMENT_OTHER): Payer: Medicare Other

## 2015-01-14 ENCOUNTER — Ambulatory Visit (HOSPITAL_BASED_OUTPATIENT_CLINIC_OR_DEPARTMENT_OTHER): Payer: Medicare Other | Admitting: Oncology

## 2015-01-14 ENCOUNTER — Encounter: Payer: Self-pay | Admitting: Oncology

## 2015-01-14 ENCOUNTER — Telehealth: Payer: Self-pay | Admitting: *Deleted

## 2015-01-14 ENCOUNTER — Ambulatory Visit: Payer: Medicare Other

## 2015-01-14 VITALS — BP 124/76 | HR 72 | Temp 97.7°F | Resp 18 | Ht 71.0 in | Wt 193.6 lb

## 2015-01-14 DIAGNOSIS — C3412 Malignant neoplasm of upper lobe, left bronchus or lung: Secondary | ICD-10-CM | POA: Diagnosis present

## 2015-01-14 DIAGNOSIS — Z7901 Long term (current) use of anticoagulants: Secondary | ICD-10-CM

## 2015-01-14 DIAGNOSIS — C3490 Malignant neoplasm of unspecified part of unspecified bronchus or lung: Secondary | ICD-10-CM | POA: Diagnosis not present

## 2015-01-14 DIAGNOSIS — C3402 Malignant neoplasm of left main bronchus: Secondary | ICD-10-CM | POA: Diagnosis present

## 2015-01-14 DIAGNOSIS — E785 Hyperlipidemia, unspecified: Secondary | ICD-10-CM | POA: Diagnosis not present

## 2015-01-14 DIAGNOSIS — I4891 Unspecified atrial fibrillation: Secondary | ICD-10-CM | POA: Diagnosis not present

## 2015-01-14 DIAGNOSIS — C3492 Malignant neoplasm of unspecified part of left bronchus or lung: Secondary | ICD-10-CM

## 2015-01-14 DIAGNOSIS — E119 Type 2 diabetes mellitus without complications: Secondary | ICD-10-CM | POA: Diagnosis not present

## 2015-01-14 DIAGNOSIS — Z95810 Presence of automatic (implantable) cardiac defibrillator: Secondary | ICD-10-CM | POA: Diagnosis not present

## 2015-01-14 DIAGNOSIS — I2510 Atherosclerotic heart disease of native coronary artery without angina pectoris: Secondary | ICD-10-CM | POA: Diagnosis not present

## 2015-01-14 DIAGNOSIS — Z85828 Personal history of other malignant neoplasm of skin: Secondary | ICD-10-CM | POA: Diagnosis not present

## 2015-01-14 DIAGNOSIS — Z950 Presence of cardiac pacemaker: Secondary | ICD-10-CM | POA: Diagnosis not present

## 2015-01-14 DIAGNOSIS — I119 Hypertensive heart disease without heart failure: Secondary | ICD-10-CM | POA: Diagnosis not present

## 2015-01-14 DIAGNOSIS — Z510 Encounter for antineoplastic radiation therapy: Secondary | ICD-10-CM | POA: Diagnosis present

## 2015-01-14 LAB — CULTURE, BLOOD (ROUTINE X 2)
CULTURE: NO GROWTH
CULTURE: NO GROWTH

## 2015-01-14 LAB — CBC WITH DIFFERENTIAL/PLATELET
BASO%: 0.4 % (ref 0.0–2.0)
Basophils Absolute: 0 10*3/uL (ref 0.0–0.1)
EOS%: 1.3 % (ref 0.0–7.0)
Eosinophils Absolute: 0.1 10*3/uL (ref 0.0–0.5)
HEMATOCRIT: 29.4 % — AB (ref 38.4–49.9)
HGB: 9.8 g/dL — ABNORMAL LOW (ref 13.0–17.1)
LYMPH#: 0.6 10*3/uL — AB (ref 0.9–3.3)
LYMPH%: 10 % — AB (ref 14.0–49.0)
MCH: 30.9 pg (ref 27.2–33.4)
MCHC: 33.2 g/dL (ref 32.0–36.0)
MCV: 92.9 fL (ref 79.3–98.0)
MONO#: 0.7 10*3/uL (ref 0.1–0.9)
MONO%: 10.4 % (ref 0.0–14.0)
NEUT%: 77.9 % — ABNORMAL HIGH (ref 39.0–75.0)
NEUTROS ABS: 4.9 10*3/uL (ref 1.5–6.5)
PLATELETS: 107 10*3/uL — AB (ref 140–400)
RBC: 3.17 10*6/uL — ABNORMAL LOW (ref 4.20–5.82)
RDW: 15.9 % — ABNORMAL HIGH (ref 11.0–14.6)
WBC: 6.3 10*3/uL (ref 4.0–10.3)

## 2015-01-14 LAB — COMPREHENSIVE METABOLIC PANEL
ALT: 21 U/L (ref 0–55)
AST: 22 U/L (ref 5–34)
Albumin: 2.8 g/dL — ABNORMAL LOW (ref 3.5–5.0)
Alkaline Phosphatase: 110 U/L (ref 40–150)
Anion Gap: 9 mEq/L (ref 3–11)
BILIRUBIN TOTAL: 0.48 mg/dL (ref 0.20–1.20)
BUN: 7.1 mg/dL (ref 7.0–26.0)
CO2: 23 meq/L (ref 22–29)
CREATININE: 0.9 mg/dL (ref 0.7–1.3)
Calcium: 8.6 mg/dL (ref 8.4–10.4)
Chloride: 105 mEq/L (ref 98–109)
EGFR: 84 mL/min/{1.73_m2} — ABNORMAL LOW (ref >90)
GLUCOSE: 162 mg/dL — AB (ref 70–140)
Potassium: 3.8 mEq/L (ref 3.5–5.1)
SODIUM: 137 meq/L (ref 136–145)
TOTAL PROTEIN: 6.4 g/dL (ref 6.4–8.3)

## 2015-01-14 LAB — MAGNESIUM: MAGNESIUM: 1.6 mg/dL (ref 1.5–2.5)

## 2015-01-14 NOTE — Progress Notes (Signed)
[facility] [phone]) [phone]   [phone]) [phone]  OFFICE PROGRESS NOTE  [name], MD [place] [address]  DIAGNOSIS: Limited stage IIIB (T3, N3, M0) small cell lung cancer diagnosed in August 2016.  PRIOR THERAPY: None  CURRENT THERAPY: Systemic chemotherapy was carboplatin for AUC of 5 on day 1 and etoposide 120 MG/M2 on days 1, 2 and 3 with Neulasta support on day 4. Status post 5 cycles. On concurrent with radiotherapy under the care of Dr. Lisbeth Renshaw.  INTERVAL HISTORY: Karl [name] 78 y.o. male returns to the clinic today for follow-up visit accompanied by his wife. The patient was recently hospitalized for pneumonia and hypoxia. He was discharged last Friday and is currently taking Augmentin. He has not had any fevers and thinks his breathing is better. He reports an ongoing cough with grayish sputum production. No hemoptysis. The patient denied having any significant chest pain, shortness of breath. He denied having any significant weight loss or night sweats. No cnausea or vomiting. He is scheduled for cycle 6 of his chemotherapy today.  MEDICAL HISTORY: Past Medical History  Diagnosis Date  . Coronary artery disease     status post CABG by Dr. Redmond Pulling in 1993 and status post percutaneous transluminal coronary angioplasty and stenting by Dr. Wynonia Lawman in 2002 and 2006  . Carotid artery occlusion     severe left internal carotid artery stenosis, asymptomatic status post carotid endarterectomy  . Hypertensive heart disease without CHF   . Carotid artery disease   . Hyperlipidemia   . ICD (implantable cardiac defibrillator) in place   . CHF (congestive heart failure) ([place])   . Anginal pain ([place])   . Pacemaker   . Type 2 diabetes mellitus with vascular disease ([place]) 12/11/2008  . H/O hiatal hernia     "gone after my bypass"  . Arthritis     "neck, back, knees"  . Ischemic cardiomyopathy   . Atrial fibrillation ([place])   . Rheumatoid  arthritis(714.0)   . AICD (automatic cardioverter/defibrillator) present   . 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 Allergy   . Lung cancer ([place]) 09/11/14    eft upper lobe lung  . Myocardial infarction ([place]) ~ 1992  . Antineoplastic chemotherapy induced anemia 12/24/2014  . Neutropenic fever ([place]) 10/11/2014  . Shortness of breath 10/24/2012    June 2014 full pulmonary function testing> ratio 86%, FEV1 2.91 L (96% predicted), no change with bronchodilator, total lung capacity 5.46 L (74% predicted) DLCO 15.09 (47% predicted) May 2014 CT chest>> there is no evidence of a pulmonary embolism, there is centrilobular emphysema seen throughout with significant paraseptal emphysema. There is also intralobular septal thickening and groundglass throughout the lungs and periphery in a fairly diffuse pattern. There no prior studies for further evaluation. 11/2012 CT Chest > no comment if progression from prior studies, persistent ground glass and interlobular septal thickening with emphysema     ALLERGIES:  is allergic to amiodarone and rosiglitazone-metformin.  MEDICATIONS:  Current Outpatient Prescriptions  Medication Sig Dispense Refill  . acetaminophen (TYLENOL) 500 MG tablet Take 500 mg by mouth every 6 (six) hours as needed for mild pain, moderate pain or headache.    Marland Kitchen amoxicillin-clavulanate (AUGMENTIN) 875-125 MG tablet Take 1 tablet by mouth 2 (two) times daily. 14 tablet 0  . carvedilol (COREG) 3.125 MG tablet Take 6.25 mg by mouth 2 (two) times daily with a meal.     . chlorpheniramine-HYDROcodone (  TUSSIONEX) 10-8 MG/5ML SUER Take 5 mLs by mouth every 12 (twelve) hours as needed for cough. 140 mL 0  . Cyanocobalamin (VITAMIN B-12) 2500 MCG SUBL Place 2,500 mcg under the tongue daily.     Marland Kitchen dofetilide (TIKOSYN) 250 MCG capsule Take 250 mcg by mouth 2 (two) times daily.    Marland Kitchen HUMALOG KWIKPEN 100 UNIT/ML KiwkPen Inject 4 Units into the skin 3 (three) times daily after meals.   6  . insulin detemir  (LEVEMIR) 100 UNIT/ML injection Inject 0.2 mLs (20 Units total) into the skin daily. 10 mL 11  . nitroGLYCERIN (NITROSTAT) 0.4 MG SL tablet Place 0.4 mg under the tongue every 5 (five) minutes as needed for chest pain.     . Omega-3 Fatty Acids (FISH OIL) 1000 MG CAPS Take 1,000 mg by mouth 2 (two) times daily.     Marland Kitchen PRADAXA 150 MG CAPS capsule TAKE 1 CAPSULE BY MOUTH TWICE DAILY (Patient taking differently: TAKE 150 MG  BY MOUTH TWICE DAILY) 60 capsule 5  . simvastatin (ZOCOR) 40 MG tablet Take 40 mg by mouth daily.      No current facility-administered medications for this visit.   Facility-Administered Medications Ordered in Other Visits  Medication Dose Route Frequency Provider Last Rate Last Dose  . heparin lock flush 100 unit/mL  500 Units Intracatheter Once PRN Curt Bears, MD      . LORazepam (ATIVAN) injection 0.5 mg  0.5 mg Intravenous PRN Curt Bears, MD      . sodium chloride 0.9 % injection 10 mL  10 mL Intracatheter PRN Curt Bears, MD        SURGICAL HISTORY:  Past Surgical History  Procedure Laterality Date  . Carotid endarterectomy  2009    left  . Cardiac defibrillator placement  05/2006    [employer] Jude   . Eye surgery  March 2013    Cataract Left eye  . Eye surgery  06/08/2011    Cataract Right eye  . Pr vein bypass graft,aorto-fem-pop    . Inguinal hernia repair  1970's    bilaterally  . Hemorrhoid surgery  1970's  . Coronary angioplasty with stent placement      "I've got 2" (08/10/11)  . Cardioversion  08/2010  . Insert / replace / remove pacemaker  05/2006    "got a defibrillator/pacemaker" (08/10/11)  . Coronary artery bypass graft  08/1991    CABG X5  . Cardioversion  08/13/2011    Procedure: CARDIOVERSION;  Surgeon: Jacolyn Reedy, MD;  Location: [facility];  Service: Cardiovascular;  Laterality: N/A;  . Left heart catheterization with coronary/graft angiogram N/A 06/16/2012    Procedure: LEFT HEART CATHETERIZATION WITH Beatrix Fetters;  Surgeon:  Jacolyn Reedy, MD;  Location: [facility] CATH LAB;  Service: Cardiovascular;  Laterality: N/A;  . Video bronchoscopy Bilateral 09/11/2014    Procedure: VIDEO BRONCHOSCOPY WITH FLUORO;  Surgeon: Juanito Doom, MD;  Location: [facility];  Service: Cardiopulmonary;  Laterality: Bilateral;  . Cataract surgery Bilateral   . Ep implantable device N/A 11/26/2014    Procedure:  ICD Generator Removal /Can only;  Surgeon: Evans Lance, MD;  Location: [facility] CV LAB;  Service: Cardiovascular;  Laterality: N/A;    REVIEW OF SYSTEMS:  Constitutional: positive for fatigue Eyes: negative Ears, nose, mouth, throat, and face: negative Respiratory: positive for dyspnea on exertion Cardiovascular: negative Gastrointestinal: negative Genitourinary:negative Integument/breast: negative Hematologic/lymphatic: negative Musculoskeletal:negative Neurological: negative Behavioral/Psych: negative Endocrine: negative Allergic/Immunologic: negative   PHYSICAL EXAMINATION: General  appearance: alert, cooperative, fatigued and no distress Head: Normocephalic, without obvious abnormality, atraumatic Neck: no adenopathy, no JVD, supple, symmetrical, trachea midline and thyroid not enlarged, symmetric, no tenderness/mass/nodules Lymph nodes: Cervical, supraclavicular, and axillary nodes normal. Resp: diminished breath sounds LLL Back: symmetric, no curvature. ROM normal. No CVA tenderness. Cardio: regular rate and rhythm, S1, S2 normal, no murmur, click, rub or gallop GI: soft, non-tender; bowel sounds normal; no masses,  no organomegaly Extremities: extremities normal, atraumatic, no cyanosis or edema Neurologic: Alert and oriented X 3, normal strength and tone. Normal symmetric reflexes. Normal coordination and gait  ECOG PERFORMANCE STATUS: 1 - Symptomatic but completely ambulatory  Blood pressure 124/76, pulse 72, temperature 97.7 F (36.5 C), temperature source Oral, resp. rate 18, height '5\' 11"'$  (1.803  m), weight 193 lb 9.6 oz (87.816 kg), SpO2 97 %.  LABORATORY DATA: Lab Results  Component Value Date   WBC 6.3 01/14/2015   HGB 9.8* 01/14/2015   HCT 29.4* 01/14/2015   MCV 92.9 01/14/2015   PLT 107* 01/14/2015      Chemistry      Component Value Date/Time   NA 137 01/14/2015 1006   NA 136 01/10/2015 0425   K 3.8 01/14/2015 1006   K 3.5 01/10/2015 0425   CL 102 01/10/2015 0425   CO2 23 01/14/2015 1006   CO2 26 01/10/2015 0425   BUN 7.1 01/14/2015 1006   BUN 14 01/10/2015 0425   CREATININE 0.9 01/14/2015 1006   CREATININE 0.96 01/10/2015 0425      Component Value Date/Time   CALCIUM 8.6 01/14/2015 1006   CALCIUM 8.4* 01/10/2015 0425   ALKPHOS 110 01/14/2015 1006   ALKPHOS 97 10/12/2014 0353   AST 22 01/14/2015 1006   AST 41 10/12/2014 0353   ALT 21 01/14/2015 1006   ALT 61 10/12/2014 0353   BILITOT 0.48 01/14/2015 1006   BILITOT 0.8 10/12/2014 0353       RADIOGRAPHIC STUDIES: Dg Chest 2 View  01/09/2015  CLINICAL DATA:  Increasing cough for 1 week. Lung cancer under treatment. EXAM: CHEST  2 VIEW COMPARISON:  10/11/2014 FINDINGS: Generator pack has been removed since prior. Unchanged positioning of single chamber ICD/ pacer lead into the right ventricle. Unchanged cardiomegaly.  There is been CABG and coronary stenting. Chronic interstitial coarsening which is diffusely increased from prior. There is a focal opacity in the left upper lobe which correlates with lung cancer and surrounding airspace disease. IMPRESSION: 1. Progressive interstitial coarsening which could be congestive or infectious. 2. Known left upper lobe malignancy. Electronically Signed   By: Monte Fantasia M.D.   On: 01/09/2015 15:06   Ct Chest W Contrast  12/21/2014  CLINICAL DATA:  Subsequent encounter for small-cell lung cancer. EXAM: CT CHEST, ABDOMEN, AND PELVIS WITH CONTRAST TECHNIQUE: Multidetector CT imaging of the chest, abdomen and pelvis was performed following the standard protocol during  bolus administration of intravenous contrast. CONTRAST:  131m OMNIPAQUE IOHEXOL 300 MG/ML  SOLN COMPARISON:  Chest CT from 11/09/2014.  PET-CT from 09/28/2014. FINDINGS: The power injector rate limited for this study resulting in delayed imaging after IV contrast administration. CT CHEST FINDINGS Mediastinum/Nodes: There is no axillary lymphadenopathy. Scattered mediastinal lymph nodes are again noted. The precarinal lymph node measured previously at 14 mm short axis is stable a 14 mm today. A tiny AP window lymph node measured previously at 7 mm short axis is 6 mm short axis today. Small lymph nodes in the right hilum are stable. Heart size is mildly  enlarged. Patient is status post CABG. No pericardial effusion. The esophagus has normal imaging features. Left-sided permanent pacemaker again noted. Lungs/Pleura: Peripheral left upper lobe pulmonary mass shows further decrease in size, measuring 1.8 x 3.6 cm today compared to 2.0 x 4.0 cm previously. The adjacent interstitial and alveolar opacity tracking centrally towards the hilum and extending anteromedially in the left lung is presumably related to a combination of treated disease and radiation change. Scattered chronic diffuse interstitial lung disease with a peripheral predominance is again noted. Small bilateral pleural effusions are new in the interval. Musculoskeletal: Bone windows reveal no worrisome lytic or sclerotic osseous lesions. CT ABDOMEN PELVIS FINDINGS Hepatobiliary: No focal abnormality within the liver parenchyma. There is no evidence for gallstones, gallbladder wall thickening, or pericholecystic fluid. No intrahepatic or extrahepatic biliary dilation. Pancreas: No focal mass lesion. No dilatation of the main duct. No intraparenchymal cyst. No peripancreatic edema. Spleen: No splenomegaly. No focal mass lesion. Adrenals/Urinary Tract: No adrenal nodule or mass. 10 mm exophytic lesion in the interpolar right kidney is unchanged and is likely a  cyst. Scarring is evident in the upper pole of the left kidney. No evidence for hydroureter. The urinary bladder appears normal for the degree of distention. Stomach/Bowel: Stomach is nondistended. No gastric wall thickening. No evidence of outlet obstruction. Duodenum is normally positioned as is the ligament of Treitz. No small bowel wall thickening. No small bowel dilatation. The terminal ileum is normal. The appendix is normal. Diverticuli are seen scattered along the entire length of the colon without CT findings of diverticulitis. Vascular/Lymphatic: There is abdominal aortic atherosclerosis without aneurysm. There is no gastrohepatic or hepatoduodenal ligament lymphadenopathy. No intraperitoneal or retroperitoneal lymphadenopy. No pelvic sidewall lymphadenopathy. Reproductive: The prostate gland and seminal vesicles have normal imaging features. Other: No intraperitoneal free fluid. Musculoskeletal: Bilateral inguinal hernias contain only fat Bone windows reveal no worrisome lytic or sclerotic osseous lesions. IMPRESSION: 1. Interval decrease in size of the dominant peripheral left upper lobe pulmonary mass with persistent interstitial and airspace disease medially in the left lung. 2. Interval development of small bilateral pleural effusions. 3. Stable appearance of scattered borderline enlarged mediastinal lymph nodes. 4. No evidence for metastatic disease in the abdomen or pelvis. Imaging findings of potential clinical significance: Abdominal aortic atherosclerosis. Electronically Signed   By: Misty Stanley M.D.   On: 12/21/2014 13:45   Ct Abdomen Pelvis W Contrast  12/21/2014  CLINICAL DATA:  Subsequent encounter for small-cell lung cancer. EXAM: CT CHEST, ABDOMEN, AND PELVIS WITH CONTRAST TECHNIQUE: Multidetector CT imaging of the chest, abdomen and pelvis was performed following the standard protocol during bolus administration of intravenous contrast. CONTRAST:  192m OMNIPAQUE IOHEXOL 300 MG/ML   SOLN COMPARISON:  Chest CT from 11/09/2014.  PET-CT from 09/28/2014. FINDINGS: The power injector rate limited for this study resulting in delayed imaging after IV contrast administration. CT CHEST FINDINGS Mediastinum/Nodes: There is no axillary lymphadenopathy. Scattered mediastinal lymph nodes are again noted. The precarinal lymph node measured previously at 14 mm short axis is stable a 14 mm today. A tiny AP window lymph node measured previously at 7 mm short axis is 6 mm short axis today. Small lymph nodes in the right hilum are stable. Heart size is mildly enlarged. Patient is status post CABG. No pericardial effusion. The esophagus has normal imaging features. Left-sided permanent pacemaker again noted. Lungs/Pleura: Peripheral left upper lobe pulmonary mass shows further decrease in size, measuring 1.8 x 3.6 cm today compared to 2.0 x 4.0 cm previously.  The adjacent interstitial and alveolar opacity tracking centrally towards the hilum and extending anteromedially in the left lung is presumably related to a combination of treated disease and radiation change. Scattered chronic diffuse interstitial lung disease with a peripheral predominance is again noted. Small bilateral pleural effusions are new in the interval. Musculoskeletal: Bone windows reveal no worrisome lytic or sclerotic osseous lesions. CT ABDOMEN PELVIS FINDINGS Hepatobiliary: No focal abnormality within the liver parenchyma. There is no evidence for gallstones, gallbladder wall thickening, or pericholecystic fluid. No intrahepatic or extrahepatic biliary dilation. Pancreas: No focal mass lesion. No dilatation of the main duct. No intraparenchymal cyst. No peripancreatic edema. Spleen: No splenomegaly. No focal mass lesion. Adrenals/Urinary Tract: No adrenal nodule or mass. 10 mm exophytic lesion in the interpolar right kidney is unchanged and is likely a cyst. Scarring is evident in the upper pole of the left kidney. No evidence for  hydroureter. The urinary bladder appears normal for the degree of distention. Stomach/Bowel: Stomach is nondistended. No gastric wall thickening. No evidence of outlet obstruction. Duodenum is normally positioned as is the ligament of Treitz. No small bowel wall thickening. No small bowel dilatation. The terminal ileum is normal. The appendix is normal. Diverticuli are seen scattered along the entire length of the colon without CT findings of diverticulitis. Vascular/Lymphatic: There is abdominal aortic atherosclerosis without aneurysm. There is no gastrohepatic or hepatoduodenal ligament lymphadenopathy. No intraperitoneal or retroperitoneal lymphadenopy. No pelvic sidewall lymphadenopathy. Reproductive: The prostate gland and seminal vesicles have normal imaging features. Other: No intraperitoneal free fluid. Musculoskeletal: Bilateral inguinal hernias contain only fat Bone windows reveal no worrisome lytic or sclerotic osseous lesions. IMPRESSION: 1. Interval decrease in size of the dominant peripheral left upper lobe pulmonary mass with persistent interstitial and airspace disease medially in the left lung. 2. Interval development of small bilateral pleural effusions. 3. Stable appearance of scattered borderline enlarged mediastinal lymph nodes. 4. No evidence for metastatic disease in the abdomen or pelvis. Imaging findings of potential clinical significance: Abdominal aortic atherosclerosis. Electronically Signed   By: Misty Stanley M.D.   On: 12/21/2014 13:45    ASSESSMENT AND PLAN: This is a very pleasant 79 year old white male with limited stage small cell lung cancer currently undergoing systemic chemotherapy with carboplatin and etoposide status post 5 cycles. This is now concurrent with radiotherapy.  The patient was seen and examined with Dr. Julien Nordmann. He is currently on oral antibiotics for pneumonia. We will hold off on his chemotherapy this week and reschedule for next week. Continue his course  of Augmentin as prescribed. When he receives chemotherapy next week we will dose reduce his chemotherapy. The carboplatin will be reduced to an AUC of 4 and the etoposide will be reduced to 80 mg/m.  We will plan to bring the patient back for follow-up visit in about 1 month with restaging CT scans just prior to this visit.  The patient was advised to call immediately if he has any concerning symptoms in the interval. The patient voices understanding of current disease status and treatment options and is in agreement with the current care plan.  All questions were answered. The patient knows to call the clinic with any problems, questions or concerns. We can certainly see the patient much sooner if necessary.  Mikey Bussing, DNP, AGPCNP-BC, AOCNP    ADDENDUM: Hematology/Oncology Attending: I had a face to face encounter with the patient today. I recommended his care plan. He will very pleasant 79 years old white male with limited  stage small cell lung cancer currently undergoing systemic chemotherapy with carboplatin and etoposide status post 5 cycles. His treatment has been concurrent with radiotherapy in the last few weeks. The patient was recently admitted to [facility] with pancytopenia as well as pneumonia and shortness of breath. He is feeling fine today but continues to have increasing fatigue and weakness as well as cough productive of grayish sputum. I recommended for the patient to delay the start of cycle #6 by 1 week to give him more time to recover from the recent hospitalization and treatment for pneumonia. I will also reduce the dose of carboplatin to AUC of 4 and etoposide to 80 MG/M2 with the start of cycle #6. The patient would come back for follow-up visit in one month's for reevaluation after repeating CT scan of the chest for restaging of his disease. He was advised to call immediately if he has any concerning symptoms in the interval.  Disclaimer: This note was  dictated with voice recognition software. Similar sounding words can inadvertently be transcribed and may be missed upon review.  Eilleen Kempf., MD 01/14/2015

## 2015-01-14 NOTE — Telephone Encounter (Signed)
Per staff message and POF I have scheduled appts. Advised scheduler of appts. JMW  

## 2015-01-15 ENCOUNTER — Ambulatory Visit: Payer: Medicare Other

## 2015-01-15 ENCOUNTER — Ambulatory Visit
Admission: RE | Admit: 2015-01-15 | Discharge: 2015-01-15 | Disposition: A | Discharge status: Home / Self Care | Payer: Medicare Other | Source: Ambulatory Visit | Attending: Radiation Oncology | Admitting: Radiation Oncology

## 2015-01-15 DIAGNOSIS — C3412 Malignant neoplasm of upper lobe, left bronchus or lung: Secondary | ICD-10-CM | POA: Diagnosis not present

## 2015-01-16 ENCOUNTER — Ambulatory Visit: Payer: Medicare Other

## 2015-01-16 ENCOUNTER — Ambulatory Visit
Admission: RE | Admit: 2015-01-16 | Discharge: 2015-01-16 | Disposition: A | Discharge status: Home / Self Care | Payer: Medicare Other | Source: Ambulatory Visit | Attending: Radiation Oncology | Admitting: Radiation Oncology

## 2015-01-16 DIAGNOSIS — C3412 Malignant neoplasm of upper lobe, left bronchus or lung: Secondary | ICD-10-CM | POA: Diagnosis not present

## 2015-01-17 ENCOUNTER — Ambulatory Visit
Admission: RE | Admit: 2015-01-17 | Discharge: 2015-01-17 | Disposition: A | Discharge status: Home / Self Care | Payer: Medicare Other | Source: Ambulatory Visit | Attending: Radiation Oncology | Admitting: Radiation Oncology

## 2015-01-17 DIAGNOSIS — C3412 Malignant neoplasm of upper lobe, left bronchus or lung: Secondary | ICD-10-CM | POA: Diagnosis not present

## 2015-01-18 ENCOUNTER — Ambulatory Visit: Payer: Medicare Other

## 2015-01-18 ENCOUNTER — Ambulatory Visit
Admission: RE | Admit: 2015-01-18 | Discharge: 2015-01-18 | Disposition: A | Discharge status: Home / Self Care | Payer: Medicare Other | Source: Ambulatory Visit | Attending: Radiation Oncology | Admitting: Radiation Oncology

## 2015-01-18 ENCOUNTER — Encounter: Payer: Self-pay | Admitting: Radiation Oncology

## 2015-01-18 VITALS — BP 119/90 | HR 55 | Temp 97.6°F | Resp 18 | Ht 71.0 in | Wt 187.4 lb

## 2015-01-18 DIAGNOSIS — C3412 Malignant neoplasm of upper lobe, left bronchus or lung: Secondary | ICD-10-CM | POA: Diagnosis not present

## 2015-01-18 NOTE — Progress Notes (Signed)
Department of Radiation Oncology  Phone:  [phone] Fax:        [phone]  Weekly Treatment Note    Name: Karl [name] Date: 01/18/2015 MRN: [id number] DOB: Jun 10, 1935   Current dose: 50 Gy  Current fraction: 25   MEDICATIONS: Current Outpatient Prescriptions  Medication Sig Dispense Refill  . amoxicillin-clavulanate (AUGMENTIN) 875-125 MG tablet Take 1 tablet by mouth 2 (two) times daily. 14 tablet 0  . chlorpheniramine-HYDROcodone (TUSSIONEX) 10-8 MG/5ML SUER Take 5 mLs by mouth every 12 (twelve) hours as needed for cough. 140 mL 0  . Cyanocobalamin (VITAMIN B-12) 2500 MCG SUBL Place 2,500 mcg under the tongue daily.     Marland Kitchen dofetilide (TIKOSYN) 250 MCG capsule Take 250 mcg by mouth 2 (two) times daily.    Marland Kitchen HUMALOG KWIKPEN 100 UNIT/ML KiwkPen Inject 4 Units into the skin 3 (three) times daily after meals.   6  . insulin detemir (LEVEMIR) 100 UNIT/ML injection Inject 0.2 mLs (20 Units total) into the skin daily. 10 mL 11  . Omega-3 Fatty Acids (FISH OIL) 1000 MG CAPS Take 1,000 mg by mouth 2 (two) times daily.     Marland Kitchen PRADAXA 150 MG CAPS capsule TAKE 1 CAPSULE BY MOUTH TWICE DAILY (Patient taking differently: TAKE 150 MG  BY MOUTH TWICE DAILY) 60 capsule 5  . simvastatin (ZOCOR) 40 MG tablet Take 40 mg by mouth daily.     . sucralfate (CARAFATE) 1 G tablet TAKE 1 TABLET (1 G TOTAL) BY MOUTH 4 (FOUR) TIMES DAILY.  2  . acetaminophen (TYLENOL) 500 MG tablet Take 500 mg by mouth every 6 (six) hours as needed for mild pain, moderate pain or headache. Reported on 01/18/2015    . carvedilol (COREG) 3.125 MG tablet Take 6.25 mg by mouth 2 (two) times daily with a meal. Reported on 01/18/2015    . nitroGLYCERIN (NITROSTAT) 0.4 MG SL tablet Place 0.4 mg under the tongue every 5 (five) minutes as needed for chest pain. Reported on 01/18/2015     No current facility-administered medications for this encounter.   Facility-Administered Medications Ordered in Other Encounters    Medication Dose Route Frequency Provider Last Rate Last Dose  . heparin lock flush 100 unit/mL  500 Units Intracatheter Once PRN Curt Bears, MD      . LORazepam (ATIVAN) injection 0.5 mg  0.5 mg Intravenous PRN Curt Bears, MD      . sodium chloride 0.9 % injection 10 mL  10 mL Intracatheter PRN Curt Bears, MD         ALLERGIES: Amiodarone and Rosiglitazone-metformin   LABORATORY DATA:  Lab Results  Component Value Date   WBC 6.3 01/14/2015   HGB 9.8* 01/14/2015   HCT 29.4* 01/14/2015   MCV 92.9 01/14/2015   PLT 107* 01/14/2015   Lab Results  Component Value Date   NA 137 01/14/2015   K 3.8 01/14/2015   CL 102 01/10/2015   CO2 23 01/14/2015   Lab Results  Component Value Date   ALT 21 01/14/2015   AST 22 01/14/2015   ALKPHOS 110 01/14/2015   BILITOT 0.48 01/14/2015     NARRATIVE: Karl [name] was seen today for weekly treatment management. The chart was checked and the patient's films were reviewed.  He has completed 25 fractions to his left lung. He denies pain. He reports an increase in shortness of breath. He reports a productive cough with black sputum. He was discharged from the hospital last Friday. He reports having  trouble swallowing - he feels like his food gets stuck in his upper chest. He is taking Carafate 3 times a day. He reports a decreased appetite and has lost 6 lbs since 01/14/15. He reports he has an irregular heart rhythm. He feels very fatigued. He last had chemotherapy 3 weeks ago.   6:27 PM BP 119/90 mmHg  Pulse 55  Temp(Src) 97.6 F (36.4 C) (Axillary)  Resp 18  Ht '5\' 11"'$  (1.803 m)  Wt 187 lb 6.4 oz (85.004 kg)  BMI 26.15 kg/m2  SpO2 97%  Wt Readings from Last 3 Encounters:  01/18/15 187 lb 6.4 oz (85.004 kg)  01/14/15 193 lb 9.6 oz (87.816 kg)  01/09/15 193 lb 9 oz (87.8 kg)    PHYSICAL EXAMINATION: height is '5\' 11"'$  (1.803 m) and weight is 187 lb 6.4 oz (85.004 kg). His axillary temperature is 97.6 F (36.4 C). His  blood pressure is 119/90 and his pulse is 55. His respiration is 18 and oxygen saturation is 97%.       Alert and oriented x3.  ASSESSMENT: The patient is doing satisfactorily with treatment. I advised the patient to drink more fluids  PLAN: We will continue with the patient's radiation treatment as planned.  This document serves as a record of services personally performed by Kyung Rudd, MD. It was created on his behalf by Darcus Austin, a trained medical scribe. The creation of this record is based on the scribe's personal observations and the provider's statements to them. This document has been checked and approved by the attending provider.

## 2015-01-18 NOTE — Progress Notes (Addendum)
Karl [name] has completed 25 fractions to his left lung.  He denies pain.  He reports an increase in shortness of breath.  He reports a productive cough with black sputum.  He was discharged from the hospital last Friday.  He reports having trouble swallowing - he feels like his food gets stuck in his upper chest.  He is taking Carafate 3 times a day.  He reports a decreased appetite and has lost 6 lbs since 01/14/15.  Orthostatic vitals taken: bp sitting 114/87, hr 110, bp sitting 119/90, hr 55.  He reports he has an irregular heart rhythm. He feels very fatigued.  He last had chemotherapy 3 weeks ago.  BP 114/87 mmHg  Pulse 110  Temp(Src) 97.6 F (36.4 C) (Axillary)  Resp 18  Ht '5\' 11"'$  (1.803 m)  Wt 187 lb 6.4 oz (85.004 kg)  BMI 26.15 kg/m2  SpO2 97%   BP 119/90 mmHg  Pulse 55  Temp(Src) 97.6 F (36.4 C) (Axillary)  Resp 18  Ht '5\' 11"'$  (1.803 m)  Wt 187 lb 6.4 oz (85.004 kg)  BMI 26.15 kg/m2  SpO2 97%  Wt Readings from Last 3 Encounters:  01/18/15 187 lb 6.4 oz (85.004 kg)  01/14/15 193 lb 9.6 oz (87.816 kg)  01/09/15 193 lb 9 oz (87.8 kg)

## 2015-01-21 ENCOUNTER — Ambulatory Visit
Admission: RE | Admit: 2015-01-21 | Discharge: 2015-01-21 | Disposition: A | Discharge status: Home / Self Care | Payer: Medicare Other | Source: Ambulatory Visit | Attending: Radiation Oncology | Admitting: Radiation Oncology

## 2015-01-21 ENCOUNTER — Ambulatory Visit (HOSPITAL_BASED_OUTPATIENT_CLINIC_OR_DEPARTMENT_OTHER): Payer: Medicare Other

## 2015-01-21 ENCOUNTER — Other Ambulatory Visit (HOSPITAL_BASED_OUTPATIENT_CLINIC_OR_DEPARTMENT_OTHER): Payer: Medicare Other

## 2015-01-21 VITALS — BP 128/71 | HR 70 | Temp 97.6°F | Resp 18

## 2015-01-21 DIAGNOSIS — Z85828 Personal history of other malignant neoplasm of skin: Secondary | ICD-10-CM | POA: Diagnosis not present

## 2015-01-21 DIAGNOSIS — Z950 Presence of cardiac pacemaker: Secondary | ICD-10-CM | POA: Diagnosis not present

## 2015-01-21 DIAGNOSIS — C3412 Malignant neoplasm of upper lobe, left bronchus or lung: Secondary | ICD-10-CM | POA: Diagnosis not present

## 2015-01-21 DIAGNOSIS — Z5111 Encounter for antineoplastic chemotherapy: Secondary | ICD-10-CM

## 2015-01-21 DIAGNOSIS — I119 Hypertensive heart disease without heart failure: Secondary | ICD-10-CM | POA: Diagnosis not present

## 2015-01-21 DIAGNOSIS — I2510 Atherosclerotic heart disease of native coronary artery without angina pectoris: Secondary | ICD-10-CM | POA: Diagnosis not present

## 2015-01-21 DIAGNOSIS — E119 Type 2 diabetes mellitus without complications: Secondary | ICD-10-CM | POA: Diagnosis not present

## 2015-01-21 DIAGNOSIS — Z95810 Presence of automatic (implantable) cardiac defibrillator: Secondary | ICD-10-CM | POA: Diagnosis not present

## 2015-01-21 DIAGNOSIS — C3402 Malignant neoplasm of left main bronchus: Secondary | ICD-10-CM | POA: Diagnosis present

## 2015-01-21 DIAGNOSIS — E785 Hyperlipidemia, unspecified: Secondary | ICD-10-CM | POA: Diagnosis not present

## 2015-01-21 DIAGNOSIS — I4891 Unspecified atrial fibrillation: Secondary | ICD-10-CM | POA: Diagnosis not present

## 2015-01-21 DIAGNOSIS — C3492 Malignant neoplasm of unspecified part of left bronchus or lung: Secondary | ICD-10-CM

## 2015-01-21 DIAGNOSIS — Z510 Encounter for antineoplastic radiation therapy: Secondary | ICD-10-CM | POA: Diagnosis present

## 2015-01-21 LAB — COMPREHENSIVE METABOLIC PANEL
ALBUMIN: 3.2 g/dL — AB (ref 3.5–5.0)
ALK PHOS: 114 U/L (ref 40–150)
ALT: 16 U/L (ref 0–55)
ANION GAP: 8 meq/L (ref 3–11)
AST: 20 U/L (ref 5–34)
BILIRUBIN TOTAL: 0.42 mg/dL (ref 0.20–1.20)
BUN: 10.3 mg/dL (ref 7.0–26.0)
CALCIUM: 9.3 mg/dL (ref 8.4–10.4)
CO2: 26 mEq/L (ref 22–29)
CREATININE: 0.9 mg/dL (ref 0.7–1.3)
Chloride: 105 mEq/L (ref 98–109)
EGFR: 84 mL/min/{1.73_m2} — ABNORMAL LOW (ref >90)
Glucose: 167 mg/dl — ABNORMAL HIGH (ref 70–140)
Potassium: 4.1 mEq/L (ref 3.5–5.1)
Sodium: 139 mEq/L (ref 136–145)
TOTAL PROTEIN: 7.4 g/dL (ref 6.4–8.3)

## 2015-01-21 LAB — CBC WITH DIFFERENTIAL/PLATELET
BASO%: 0.5 % (ref 0.0–2.0)
BASOS ABS: 0 10*3/uL (ref 0.0–0.1)
EOS ABS: 0 10*3/uL (ref 0.0–0.5)
EOS%: 0.3 % (ref 0.0–7.0)
HCT: 30.6 % — ABNORMAL LOW (ref 38.4–49.9)
HEMOGLOBIN: 10 g/dL — AB (ref 13.0–17.1)
LYMPH%: 11.8 % — ABNORMAL LOW (ref 14.0–49.0)
MCH: 30.4 pg (ref 27.2–33.4)
MCHC: 32.7 g/dL (ref 32.0–36.0)
MCV: 93 fL (ref 79.3–98.0)
MONO#: 0.8 10*3/uL (ref 0.1–0.9)
MONO%: 11.3 % (ref 0.0–14.0)
NEUT%: 76.1 % — ABNORMAL HIGH (ref 39.0–75.0)
NEUTROS ABS: 5.1 10*3/uL (ref 1.5–6.5)
PLATELETS: 256 10*3/uL (ref 140–400)
RBC: 3.29 10*6/uL — ABNORMAL LOW (ref 4.20–5.82)
RDW: 16.4 % — AB (ref 11.0–14.6)
WBC: 6.7 10*3/uL (ref 4.0–10.3)
lymph#: 0.8 10*3/uL — ABNORMAL LOW (ref 0.9–3.3)

## 2015-01-21 LAB — MAGNESIUM: Magnesium: 2.1 mg/dl (ref 1.5–2.5)

## 2015-01-21 MED ORDER — SODIUM CHLORIDE 0.9 % IV SOLN
80.0000 mg/m2 | Freq: Once | INTRAVENOUS | Status: AC
  Administered 2015-01-21: 170 mg via INTRAVENOUS
  Filled 2015-01-21: qty 8.5

## 2015-01-21 MED ORDER — SODIUM CHLORIDE 0.9 % IV SOLN
426.4000 mg | Freq: Once | INTRAVENOUS | Status: AC
  Administered 2015-01-21: 430 mg via INTRAVENOUS
  Filled 2015-01-21: qty 43

## 2015-01-21 MED ORDER — LORAZEPAM 2 MG/ML IJ SOLN
INTRAMUSCULAR | Status: AC
  Filled 2015-01-21: qty 1

## 2015-01-21 MED ORDER — SODIUM CHLORIDE 0.9 % IV SOLN
Freq: Once | INTRAVENOUS | Status: AC
  Administered 2015-01-21: 12:00:00 via INTRAVENOUS

## 2015-01-21 MED ORDER — SODIUM CHLORIDE 0.9 % IV SOLN
Freq: Once | INTRAVENOUS | Status: AC
  Administered 2015-01-21: 12:00:00 via INTRAVENOUS
  Filled 2015-01-21: qty 5

## 2015-01-21 MED ORDER — LORAZEPAM 2 MG/ML IJ SOLN
0.5000 mg | INTRAMUSCULAR | Status: AC | PRN
  Administered 2015-01-21: 0.5 mg via INTRAVENOUS

## 2015-01-21 MED ORDER — SODIUM CHLORIDE 0.9 % IV SOLN: Freq: Once | INTRAVENOUS | Status: DC

## 2015-01-21 NOTE — Patient Instructions (Signed)
[facility] Discharge Instructions for Patients Receiving Chemotherapy  Today you received the following chemotherapy agents Carboplatin and Etoposide  To help prevent nausea and vomiting after your treatment, we encourage you to take your nausea medication as directed If you develop nausea and vomiting that is not controlled by your nausea medication, call the clinic.   BELOW ARE SYMPTOMS THAT SHOULD BE REPORTED IMMEDIATELY:  *FEVER GREATER THAN 100.5 F  *CHILLS WITH OR WITHOUT FEVER  NAUSEA AND VOMITING THAT IS NOT CONTROLLED WITH YOUR NAUSEA MEDICATION  *UNUSUAL SHORTNESS OF BREATH  *UNUSUAL BRUISING OR BLEEDING  TENDERNESS IN MOUTH AND THROAT WITH OR WITHOUT PRESENCE OF ULCERS  *URINARY PROBLEMS  *BOWEL PROBLEMS  UNUSUAL RASH Items with * indicate a potential emergency and should be followed up as soon as possible.  Feel free to call the clinic you have any questions or concerns. The clinic phone number is [phone].  Please show the CHEMO ALERT CARD at check-in to the Emergency Department and triage nurse.   

## 2015-01-22 ENCOUNTER — Ambulatory Visit
Admission: RE | Admit: 2015-01-22 | Discharge: 2015-01-22 | Disposition: A | Discharge status: Home / Self Care | Payer: Medicare Other | Source: Ambulatory Visit | Attending: Radiation Oncology | Admitting: Radiation Oncology

## 2015-01-22 ENCOUNTER — Ambulatory Visit (HOSPITAL_BASED_OUTPATIENT_CLINIC_OR_DEPARTMENT_OTHER): Payer: Medicare Other

## 2015-01-22 ENCOUNTER — Ambulatory Visit: Payer: Medicare Other

## 2015-01-22 VITALS — BP 119/60 | HR 106 | Temp 98.1°F | Resp 18

## 2015-01-22 DIAGNOSIS — Z5111 Encounter for antineoplastic chemotherapy: Secondary | ICD-10-CM

## 2015-01-22 DIAGNOSIS — C3412 Malignant neoplasm of upper lobe, left bronchus or lung: Secondary | ICD-10-CM

## 2015-01-22 DIAGNOSIS — C3492 Malignant neoplasm of unspecified part of left bronchus or lung: Secondary | ICD-10-CM

## 2015-01-22 MED ORDER — SODIUM CHLORIDE 0.9 % IV SOLN
Freq: Once | INTRAVENOUS | Status: AC
  Administered 2015-01-22: 12:00:00 via INTRAVENOUS

## 2015-01-22 MED ORDER — SODIUM CHLORIDE 0.9 % IV SOLN
Freq: Once | INTRAVENOUS | Status: AC
  Administered 2015-01-22: 12:00:00 via INTRAVENOUS
  Filled 2015-01-22: qty 1

## 2015-01-22 MED ORDER — SODIUM CHLORIDE 0.9 % IV SOLN
80.0000 mg/m2 | Freq: Once | INTRAVENOUS | Status: AC
  Administered 2015-01-22: 170 mg via INTRAVENOUS
  Filled 2015-01-22: qty 8.5

## 2015-01-22 NOTE — Patient Instructions (Signed)
[facility] Discharge Instructions for Patients Receiving Chemotherapy  Today you received the following chemotherapy agents: Etoposide.  To help prevent nausea and vomiting after your treatment, we encourage you to take your nausea medication: Compazine. Take one every 6 hours as needed.   If you develop nausea and vomiting that is not controlled by your nausea medication, call the clinic.   BELOW ARE SYMPTOMS THAT SHOULD BE REPORTED IMMEDIATELY:  *FEVER GREATER THAN 100.5 F  *CHILLS WITH OR WITHOUT FEVER  NAUSEA AND VOMITING THAT IS NOT CONTROLLED WITH YOUR NAUSEA MEDICATION  *UNUSUAL SHORTNESS OF BREATH  *UNUSUAL BRUISING OR BLEEDING  TENDERNESS IN MOUTH AND THROAT WITH OR WITHOUT PRESENCE OF ULCERS  *URINARY PROBLEMS  *BOWEL PROBLEMS  UNUSUAL RASH Items with * indicate a potential emergency and should be followed up as soon as possible.  Feel free to call the clinic should you have any questions or concerns. The clinic phone number is [phone].  Please show the CHEMO ALERT CARD at check-in to the Emergency Department and triage nurse.   

## 2015-01-23 ENCOUNTER — Ambulatory Visit
Admission: RE | Admit: 2015-01-23 | Discharge: 2015-01-23 | Disposition: A | Discharge status: Home / Self Care | Payer: Medicare Other | Source: Ambulatory Visit | Attending: Radiation Oncology | Admitting: Radiation Oncology

## 2015-01-23 ENCOUNTER — Ambulatory Visit (HOSPITAL_BASED_OUTPATIENT_CLINIC_OR_DEPARTMENT_OTHER): Payer: Medicare Other

## 2015-01-23 VITALS — BP 132/78 | HR 108 | Temp 97.7°F | Resp 18

## 2015-01-23 DIAGNOSIS — Z5111 Encounter for antineoplastic chemotherapy: Secondary | ICD-10-CM

## 2015-01-23 DIAGNOSIS — C3412 Malignant neoplasm of upper lobe, left bronchus or lung: Secondary | ICD-10-CM

## 2015-01-23 DIAGNOSIS — C3492 Malignant neoplasm of unspecified part of left bronchus or lung: Secondary | ICD-10-CM

## 2015-01-23 MED ORDER — SODIUM CHLORIDE 0.9 % IV SOLN
Freq: Once | INTRAVENOUS | Status: AC
  Administered 2015-01-23: 12:00:00 via INTRAVENOUS

## 2015-01-23 MED ORDER — LORAZEPAM 2 MG/ML IJ SOLN
0.5000 mg | INTRAMUSCULAR | Status: AC | PRN
  Administered 2015-01-23: 0.5 mg via INTRAVENOUS

## 2015-01-23 MED ORDER — SODIUM CHLORIDE 0.9 % IV SOLN
Freq: Once | INTRAVENOUS | Status: AC
  Administered 2015-01-23: 12:00:00 via INTRAVENOUS
  Filled 2015-01-23: qty 1

## 2015-01-23 MED ORDER — SODIUM CHLORIDE 0.9 % IV SOLN
80.0000 mg/m2 | Freq: Once | INTRAVENOUS | Status: AC
  Administered 2015-01-23: 170 mg via INTRAVENOUS
  Filled 2015-01-23: qty 8.5

## 2015-01-23 MED ORDER — LORAZEPAM 2 MG/ML IJ SOLN
INTRAMUSCULAR | Status: AC
  Filled 2015-01-23: qty 1

## 2015-01-23 NOTE — Patient Instructions (Signed)
[facility] Discharge Instructions for Patients Receiving Chemotherapy  Today you received the following chemotherapy agents:  Etoposide  To help prevent nausea and vomiting after your treatment, we encourage you to take your nausea medication.   If you develop nausea and vomiting that is not controlled by your nausea medication, call the clinic.   BELOW ARE SYMPTOMS THAT SHOULD BE REPORTED IMMEDIATELY:  *FEVER GREATER THAN 100.5 F  *CHILLS WITH OR WITHOUT FEVER  NAUSEA AND VOMITING THAT IS NOT CONTROLLED WITH YOUR NAUSEA MEDICATION  *UNUSUAL SHORTNESS OF BREATH  *UNUSUAL BRUISING OR BLEEDING  TENDERNESS IN MOUTH AND THROAT WITH OR WITHOUT PRESENCE OF ULCERS  *URINARY PROBLEMS  *BOWEL PROBLEMS  UNUSUAL RASH Items with * indicate a potential emergency and should be followed up as soon as possible.  Feel free to call the clinic you have any questions or concerns. The clinic phone number is [phone].  Please show the CHEMO ALERT CARD at check-in to the Emergency Department and triage nurse.   

## 2015-01-24 ENCOUNTER — Ambulatory Visit: Payer: Medicare Other

## 2015-01-24 ENCOUNTER — Ambulatory Visit
Admission: RE | Admit: 2015-01-24 | Discharge: 2015-01-24 | Disposition: A | Discharge status: Home / Self Care | Payer: Medicare Other | Source: Ambulatory Visit | Attending: Radiation Oncology | Admitting: Radiation Oncology

## 2015-01-24 DIAGNOSIS — C3412 Malignant neoplasm of upper lobe, left bronchus or lung: Secondary | ICD-10-CM | POA: Diagnosis not present

## 2015-01-25 ENCOUNTER — Encounter: Payer: Self-pay | Admitting: Radiation Oncology

## 2015-01-25 ENCOUNTER — Ambulatory Visit: Payer: Medicare Other

## 2015-01-25 ENCOUNTER — Ambulatory Visit
Admission: RE | Admit: 2015-01-25 | Discharge: 2015-01-25 | Disposition: A | Discharge status: Home / Self Care | Payer: Medicare Other | Source: Ambulatory Visit | Attending: Radiation Oncology | Admitting: Radiation Oncology

## 2015-01-25 ENCOUNTER — Ambulatory Visit (HOSPITAL_BASED_OUTPATIENT_CLINIC_OR_DEPARTMENT_OTHER): Payer: Medicare Other

## 2015-01-25 VITALS — BP 124/71 | HR 116 | Temp 98.8°F

## 2015-01-25 VITALS — BP 138/66 | HR 115 | Temp 97.6°F | Resp 20 | Wt 192.7 lb

## 2015-01-25 DIAGNOSIS — Z5189 Encounter for other specified aftercare: Secondary | ICD-10-CM | POA: Diagnosis not present

## 2015-01-25 DIAGNOSIS — C3412 Malignant neoplasm of upper lobe, left bronchus or lung: Secondary | ICD-10-CM | POA: Diagnosis not present

## 2015-01-25 DIAGNOSIS — C3492 Malignant neoplasm of unspecified part of left bronchus or lung: Secondary | ICD-10-CM

## 2015-01-25 MED ORDER — PEGFILGRASTIM INJECTION 6 MG/0.6ML ~~LOC~~
6.0000 mg | PREFILLED_SYRINGE | Freq: Once | SUBCUTANEOUS | Status: AC
  Administered 2015-01-25: 6 mg via SUBCUTANEOUS
  Filled 2015-01-25: qty 0.6

## 2015-01-25 NOTE — Progress Notes (Signed)
Department of Radiation Oncology  Phone:  [phone] Fax:        [phone]  Weekly Treatment Note    Name: Karl [name] Date: 01/25/2015 MRN: [id number] DOB: 1936/01/14   Current dose: 60 Gy  Current fraction: 30   MEDICATIONS: Current Outpatient Prescriptions  Medication Sig Dispense Refill  . acetaminophen (TYLENOL) 500 MG tablet Take 500 mg by mouth every 6 (six) hours as needed for mild pain, moderate pain or headache. Reported on 01/18/2015    . amoxicillin-clavulanate (AUGMENTIN) 875-125 MG tablet Take 1 tablet by mouth 2 (two) times daily. 14 tablet 0  . carvedilol (COREG) 3.125 MG tablet Take 6.25 mg by mouth 2 (two) times daily with a meal. Reported on 01/18/2015    . chlorpheniramine-HYDROcodone (TUSSIONEX) 10-8 MG/5ML SUER Take 5 mLs by mouth every 12 (twelve) hours as needed for cough. 140 mL 0  . Cyanocobalamin (VITAMIN B-12) 2500 MCG SUBL Place 2,500 mcg under the tongue daily.     Marland Kitchen dofetilide (TIKOSYN) 250 MCG capsule Take 250 mcg by mouth 2 (two) times daily.    Marland Kitchen HUMALOG KWIKPEN 100 UNIT/ML KiwkPen Inject 4 Units into the skin 3 (three) times daily after meals.   6  . insulin detemir (LEVEMIR) 100 UNIT/ML injection Inject 0.2 mLs (20 Units total) into the skin daily. 10 mL 11  . nitroGLYCERIN (NITROSTAT) 0.4 MG SL tablet Place 0.4 mg under the tongue every 5 (five) minutes as needed for chest pain. Reported on 01/18/2015    . Omega-3 Fatty Acids (FISH OIL) 1000 MG CAPS Take 1,000 mg by mouth 2 (two) times daily.     Marland Kitchen PRADAXA 150 MG CAPS capsule TAKE 1 CAPSULE BY MOUTH TWICE DAILY (Patient taking differently: TAKE 150 MG  BY MOUTH TWICE DAILY) 60 capsule 5  . prochlorperazine (COMPAZINE) 10 MG tablet Take 10 mg by mouth every 6 (six) hours as needed for nausea or vomiting.    . simvastatin (ZOCOR) 40 MG tablet Take 40 mg by mouth daily.     . sucralfate (CARAFATE) 1 G tablet TAKE 1 TABLET (1 G TOTAL) BY MOUTH 4 (FOUR) TIMES DAILY.  2   No current  facility-administered medications for this encounter.   Facility-Administered Medications Ordered in Other Encounters  Medication Dose Route Frequency Provider Last Rate Last Dose  . heparin lock flush 100 unit/mL  500 Units Intracatheter Once PRN Curt Bears, MD      . LORazepam (ATIVAN) injection 0.5 mg  0.5 mg Intravenous PRN Curt Bears, MD      . sodium chloride 0.9 % injection 10 mL  10 mL Intracatheter PRN Curt Bears, MD         ALLERGIES: Amiodarone and Rosiglitazone-metformin   LABORATORY DATA:  Lab Results  Component Value Date   WBC 6.7 01/21/2015   HGB 10.0* 01/21/2015   HCT 30.6* 01/21/2015   MCV 93.0 01/21/2015   PLT 256 01/21/2015   Lab Results  Component Value Date   NA 139 01/21/2015   K 4.1 01/21/2015   CL 102 01/10/2015   CO2 26 01/21/2015   Lab Results  Component Value Date   ALT 16 01/21/2015   AST 20 01/21/2015   ALKPHOS 114 01/21/2015   BILITOT 0.42 01/21/2015     NARRATIVE: Karl [name] was seen today for weekly treatment management. The chart was checked and the patient's films were reviewed.  He reports his breathing is the same and his swallowing is "all right." He is short  of breath with exertion. He has slight nausea and does not have difficulty swallowing solids or fluids. He is also fatigued.  12:54 PM BP 138/66 mmHg  Pulse 115  Temp(Src) 97.6 F (36.4 C) (Oral)  Resp 20  Wt 192 lb 11.2 oz (87.408 kg)  SpO2 96%  Wt Readings from Last 3 Encounters:  01/25/15 192 lb 11.2 oz (87.408 kg)  01/18/15 187 lb 6.4 oz (85.004 kg)  01/14/15 193 lb 9.6 oz (87.816 kg)    PHYSICAL EXAMINATION: weight is 192 lb 11.2 oz (87.408 kg). His oral temperature is 97.6 F (36.4 C). His blood pressure is 138/66 and his pulse is 115. His respiration is 20 and oxygen saturation is 96%.  Alert and oriented x3. Erythema in the left upper chest with some mild desquamation. No moist desquamation present.  ASSESSMENT: The patient has done  satisfactorily with treatment.  PLAN: The patient has completed radiation and will follow up in 1 month. He is scheduled for a chest CT on 02/08/15.  This document serves as a record of services personally performed by Kyung Rudd, MD. It was created on his behalf by Darcus Austin, a trained medical scribe. The creation of this record is based on the scribe's personal observations and the provider's statements to them. This document has been checked and approved by the attending provider.

## 2015-01-25 NOTE — Progress Notes (Signed)
weekly rad tx last tx lung, sob with exertion,  Slight nausea,doesn't take medication for this, no difficulty swallowing solids or fluids, fatigued,  1 month f/u appt given ,  BP 138/66 mmHg  Pulse 115  Temp(Src) 97.6 F (36.4 C) (Oral)  Resp 20  Wt 192 lb 11.2 oz (87.408 kg)  SpO2 96%  Wt Readings from Last 3 Encounters:  01/25/15 192 lb 11.2 oz (87.408 kg)  01/18/15 187 lb 6.4 oz (85.004 kg)  01/14/15 193 lb 9.6 oz (87.816 kg)

## 2015-02-08 ENCOUNTER — Encounter (HOSPITAL_COMMUNITY): Payer: Self-pay

## 2015-02-08 ENCOUNTER — Ambulatory Visit (HOSPITAL_COMMUNITY)
Admission: RE | Admit: 2015-02-08 | Discharge: 2015-02-08 | Disposition: A | Discharge status: Home / Self Care | Payer: Medicare Other | Source: Ambulatory Visit | Attending: Oncology | Admitting: Oncology

## 2015-02-08 DIAGNOSIS — C3490 Malignant neoplasm of unspecified part of unspecified bronchus or lung: Secondary | ICD-10-CM | POA: Diagnosis not present

## 2015-02-08 DIAGNOSIS — I7090 Unspecified atherosclerosis: Secondary | ICD-10-CM | POA: Insufficient documentation

## 2015-02-08 DIAGNOSIS — Z951 Presence of aortocoronary bypass graft: Secondary | ICD-10-CM | POA: Diagnosis not present

## 2015-02-08 DIAGNOSIS — J90 Pleural effusion, not elsewhere classified: Secondary | ICD-10-CM | POA: Insufficient documentation

## 2015-02-08 MED ORDER — IOHEXOL 300 MG/ML  SOLN
100.0000 mL | Freq: Once | INTRAMUSCULAR | Status: AC | PRN
  Administered 2015-02-08: 100 mL via INTRAVENOUS

## 2015-02-11 ENCOUNTER — Ambulatory Visit: Payer: Medicare Other | Admitting: Internal Medicine

## 2015-02-11 ENCOUNTER — Other Ambulatory Visit: Payer: Medicare Other

## 2015-02-12 ENCOUNTER — Telehealth: Payer: Self-pay | Admitting: Internal Medicine

## 2015-02-12 ENCOUNTER — Encounter: Payer: Medicare Other | Admitting: Internal Medicine

## 2015-02-12 NOTE — Telephone Encounter (Signed)
Patient called to r/s missed 1/9 appointments. Gave patient lab/fu for 1/31.

## 2015-02-19 ENCOUNTER — Other Ambulatory Visit (HOSPITAL_COMMUNITY): Payer: Self-pay | Admitting: Internal Medicine

## 2015-02-19 DIAGNOSIS — J90 Pleural effusion, not elsewhere classified: Secondary | ICD-10-CM

## 2015-02-22 ENCOUNTER — Ambulatory Visit (HOSPITAL_COMMUNITY)
Admission: RE | Admit: 2015-02-22 | Discharge: 2015-02-22 | Disposition: A | Discharge status: Home / Self Care | Payer: Medicare Other | Source: Ambulatory Visit | Attending: Radiology | Admitting: Radiology

## 2015-02-22 ENCOUNTER — Ambulatory Visit (HOSPITAL_COMMUNITY)
Admission: RE | Admit: 2015-02-22 | Discharge: 2015-02-22 | Disposition: A | Discharge status: Home / Self Care | Payer: Medicare Other | Source: Ambulatory Visit | Attending: Internal Medicine | Admitting: Internal Medicine

## 2015-02-22 DIAGNOSIS — C3490 Malignant neoplasm of unspecified part of unspecified bronchus or lung: Secondary | ICD-10-CM | POA: Diagnosis not present

## 2015-02-22 DIAGNOSIS — Z9889 Other specified postprocedural states: Secondary | ICD-10-CM | POA: Insufficient documentation

## 2015-02-22 DIAGNOSIS — J90 Pleural effusion, not elsewhere classified: Secondary | ICD-10-CM | POA: Diagnosis not present

## 2015-02-22 DIAGNOSIS — J948 Other specified pleural conditions: Secondary | ICD-10-CM | POA: Diagnosis present

## 2015-02-22 DIAGNOSIS — R0602 Shortness of breath: Secondary | ICD-10-CM | POA: Diagnosis not present

## 2015-02-22 LAB — BODY FLUID CELL COUNT WITH DIFFERENTIAL
LYMPHS FL: 72 %
MONOCYTE-MACROPHAGE-SEROUS FLUID: 24 % — AB (ref 50–90)
NEUTROPHIL FLUID: 4 % (ref 0–25)
Total Nucleated Cell Count, Fluid: 618 cu mm (ref 0–1000)

## 2015-02-22 LAB — GRAM STAIN

## 2015-02-22 LAB — GLUCOSE, SEROUS FLUID: Glucose, Fluid: 188 mg/dL

## 2015-02-22 LAB — PROTEIN, BODY FLUID: TOTAL PROTEIN, FLUID: 3.4 g/dL

## 2015-02-22 NOTE — Procedures (Signed)
Successful US guided left thoracentesis. Yielded 549m of clear yellow fluid. Pt tolerated procedure well. No immediate complications.  Specimen was sent for labs. CXR ordered.  BAscencion DikePA-C 02/22/2015 11:42 AM

## 2015-02-25 ENCOUNTER — Encounter: Payer: Self-pay | Admitting: Radiation Oncology

## 2015-02-27 LAB — CULTURE, BODY FLUID-BOTTLE: CULTURE: NO GROWTH

## 2015-02-27 LAB — CULTURE, BODY FLUID W GRAM STAIN -BOTTLE

## 2015-03-01 ENCOUNTER — Ambulatory Visit
Admission: RE | Admit: 2015-03-01 | Discharge: 2015-03-01 | Disposition: A | Discharge status: Home / Self Care | Payer: Medicare Other | Source: Ambulatory Visit | Attending: Radiation Oncology | Admitting: Radiation Oncology

## 2015-03-01 ENCOUNTER — Encounter: Payer: Self-pay | Admitting: Radiation Oncology

## 2015-03-01 VITALS — BP 110/50 | HR 102 | Temp 97.7°F | Resp 24 | Ht 71.0 in | Wt 177.1 lb

## 2015-03-01 DIAGNOSIS — C3412 Malignant neoplasm of upper lobe, left bronchus or lung: Secondary | ICD-10-CM

## 2015-03-01 HISTORY — DX: Personal history of irradiation: Z92.3

## 2015-03-01 NOTE — Progress Notes (Signed)
Follow up   Rad tx  completed 01/25/15, had a thoracentesis 02/22/15  530 ml  Removed  Left chest, still has a cough productive and sob since procedure, just finished Augmentin  7 day treat and now taking tessalon perls which are helping stated, very little appetite but just started to eat better,  and no energy, room air  95% sob  after  Ambulating down here [id number], has subsided now, 1:58 PM BP 110/50 mmHg  Pulse 102  Temp(Src) 97.7 F (36.5 C) (Oral)  Resp 24  Ht '5\' 11"'$  (1.803 m)  Wt 177 lb 1.6 oz (80.332 kg)  BMI 24.71 kg/m2  SpO2 95%  Wt Readings from Last 3 Encounters:  03/01/15 177 lb 1.6 oz (80.332 kg)  01/25/15 192 lb 11.2 oz (87.408 kg)  01/18/15 187 lb 6.4 oz (85.004 kg)

## 2015-03-04 NOTE — Progress Notes (Signed)
Radiation Oncology         [phone] ________________________________  Name: Karl [name] MRN: [id number]  Date: 03/01/2015  DOB: 1935-02-21  Follow-Up Visit Note  CC: [name], MD  Curt Bears, MD  Diagnosis:      ICD-9-CM ICD-10-CM   1. 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    Follow up   Rad tx  completed 01/25/15, had a thoracentesis 02/22/15  530 ml  Removed  Left chest, still has a cough productive and sob since procedure, just finished Augmentin  7 day treat and now taking tessalon perls which are helping stated, very little appetite but just started to eat better,  and no energy, room air  95% sob  after  Ambulating down here [id number], has subsided now, 6:12 PM BP 110/50 mmHg  Pulse 102  Temp(Src) 97.7 F (36.5 C) (Oral)  Resp 24  Ht '5\' 11"'$  (1.803 m)  Wt 177 lb 1.6 oz (80.332 kg)  BMI 24.71 kg/m2  SpO2 95%  Wt Readings from Last 3 Encounters:  03/01/15 177 lb 1.6 oz (80.332 kg)  01/25/15 192 lb 11.2 oz (87.408 kg)  01/18/15 187 lb 6.4 oz (85.004 kg)                                ALLERGIES:  is allergic to amiodarone and rosiglitazone-metformin.  Meds: Current Outpatient Prescriptions  Medication Sig Dispense Refill  . acetaminophen (TYLENOL) 500 MG tablet Take 500 mg by mouth every 6 (six) hours as needed for mild pain, moderate pain or headache. Reported on 01/18/2015    . benzonatate (TESSALON) 200 MG capsule Take 200 mg by mouth.    . carvedilol (COREG) 3.125 MG tablet Take 6.25 mg by mouth 2 (two) times daily with a meal. Reported on 01/18/2015    . Cyanocobalamin (VITAMIN B-12) 2500 MCG SUBL Place 2,500 mcg under the tongue daily.     Marland Kitchen dofetilide (TIKOSYN) 250 MCG capsule Take 250 mcg by mouth 2 (two) times daily.    Marland Kitchen HUMALOG KWIKPEN 100 UNIT/ML KiwkPen Inject 4 Units into the skin 3 (three)  times daily after meals.   6  . insulin detemir (LEVEMIR) 100 UNIT/ML injection Inject 0.2 mLs (20 Units total) into the skin daily. 10 mL 11  . Omega-3 Fatty Acids (FISH OIL) 1000 MG CAPS Take 1,000 mg by mouth 2 (two) times daily.     Marland Kitchen PRADAXA 150 MG CAPS capsule TAKE 1 CAPSULE BY MOUTH TWICE DAILY (Patient taking differently: TAKE 150 MG  BY MOUTH TWICE DAILY) 60 capsule 5  . simvastatin (ZOCOR) 40 MG tablet Take 40 mg by mouth daily.     . nitroGLYCERIN (NITROSTAT) 0.4 MG SL tablet Place 0.4 mg under the tongue every 5 (five) minutes as needed for chest pain. Reported on 03/01/2015    . prochlorperazine (COMPAZINE) 10 MG tablet Take 10 mg by mouth every 6 (six) hours as needed for nausea or vomiting. Reported on 03/01/2015    . sucralfate (CARAFATE) 1 G tablet Reported on 03/01/2015  2   No current facility-administered medications for this encounter.   Facility-Administered Medications Ordered in Other Encounters  Medication Dose Route Frequency Provider Last Rate Last Dose  . heparin lock flush 100 unit/mL  500 Units  Intracatheter Once PRN Curt Bears, MD      . LORazepam (ATIVAN) injection 0.5 mg  0.5 mg Intravenous PRN Curt Bears, MD      . sodium chloride 0.9 % injection 10 mL  10 mL Intracatheter PRN Curt Bears, MD        Physical Findings: The patient is in no acute distress. Patient is alert and oriented.  height is '5\' 11"'$  (1.803 m) and weight is 177 lb 1.6 oz (80.332 kg). His oral temperature is 97.7 F (36.5 C). His blood pressure is 110/50 and his pulse is 102. His respiration is 24 and oxygen saturation is 95%. .     Lab Findings: Lab Results  Component Value Date   WBC 6.7 01/21/2015   HGB 10.0* 01/21/2015   HCT 30.6* 01/21/2015   MCV 93.0 01/21/2015   PLT 256 01/21/2015     Radiographic Findings: Dg Chest 1 View  02/22/2015  CLINICAL DATA:  Status post thoracentesis. EXAM: CHEST 1 VIEW COMPARISON:  CT 02/08/2015.  Radiographs 01/09/2015. FINDINGS:  1142 hour. The left pleural effusion has decreased in volume. There is no evidence of pneumothorax. Retained left AICD lead is unchanged. The heart size is stable status post median sternotomy and CABG. Residual left upper lobe mass is less dense than on prior radiographs. Extensive chronic lung disease with subpleural reticulation again noted. IMPRESSION: Decreased left pleural effusion following left thoracentesis. No evidence of pneumothorax. Electronically Signed   By: Richardean Sale M.D.   On: 02/22/2015 12:08   Ct Chest W Contrast  02/08/2015  CLINICAL DATA:  Restaging small cell lung cancer diagnosed in August 2016. Chemotherapy and radiation therapy completed. Chronic cough. EXAM: CT CHEST, ABDOMEN, AND PELVIS WITH CONTRAST TECHNIQUE: Multidetector CT imaging of the chest, abdomen and pelvis was performed following the standard protocol during bolus administration of intravenous contrast. CONTRAST:  166m OMNIPAQUE IOHEXOL 300 MG/ML  SOLN COMPARISON:  CTs 12/21/2014.  PET-CT 09/28/2014. FINDINGS: CT CHEST Mediastinum/Nodes: There are no enlarged mediastinal, hilar or axillary lymph nodes. Small mediastinal and hilar lymph nodes are unchanged, some calcified. The thyroid gland and esophagus demonstrate no significant findings. There is stable cardiomegaly and extensive atherosclerosis status post CABG. Cardiac pacemaker noted. No significant pericardial effusion. Lungs/Pleura: Interval enlargement of dependent left pleural effusion without associated pleural nodularity. The small dependent right pleural effusion appears unchanged.There has been further retraction of the dominant peripheral left upper lobe mass, now measuring approximately 2.7 x 1.7 cm on image 21 (previously 3.6 x 1.8 cm). Adjacent left upper lobe opacities radiating to the hilum are unchanged, probably related to treated tumor and/or radiation change. Extensive chronic lung disease is unchanged with scattered scarring, subpleural  reticulation and diffuse interstitial prominence. No new or enlarging pulmonary nodules identified. Musculoskeletal/Chest wall: No chest wall mass or suspicious osseous findings. CT ABDOMEN AND PELVIS FINDINGS Hepatobiliary: The liver is normal in density without focal abnormality. No evidence of gallstones, gallbladder wall thickening or biliary dilatation. Pancreas: Unremarkable. No pancreatic ductal dilatation or surrounding inflammatory changes. Spleen: Normal in size without focal abnormality. Adrenals/Urinary Tract: Both adrenal glands appear normal. The kidneys appear unchanged with cortical scarring in the upper pole of the left kidney and a small exophytic right renal cyst. The bladder appears normal. There is no hydronephrosis. Stomach/Bowel: No evidence of bowel wall thickening, distention or surrounding inflammatory change. Stable diverticular changes throughout the colon. The appendix appears normal. Vascular/Lymphatic: There are no enlarged abdominal or pelvic lymph nodes. Small lymph nodes within the porta  hepatis are stable. There is stable extensive atherosclerosis of the aorta, its branches and the iliac arteries. Reproductive: Stable mild enlargement of the prostate gland. Other: No ascites or peritoneal nodularity. Mildly prominent fat in both inguinal canals is stable. Musculoskeletal: No acute or significant osseous findings. There are degenerative changes throughout the spine. IMPRESSION: 1. Further regression in the primary left upper lobe mass. Small mediastinal lymph nodes are unchanged. No evidence of disease progression. 2. Interval enlargement of left pleural effusion retaining a simple appearance by CT. The small right pleural effusion appears unchanged. 3. Grossly stable extensive chronic lung disease. 4. No suspicious findings in the abdomen or pelvis. 5. Stable extensive atherosclerosis post CABG. Electronically Signed   By: Richardean Sale M.D.   On: 02/08/2015 10:00   Ct Abdomen  Pelvis W Contrast  02/08/2015  CLINICAL DATA:  Restaging small cell lung cancer diagnosed in August 2016. Chemotherapy and radiation therapy completed. Chronic cough. EXAM: CT CHEST, ABDOMEN, AND PELVIS WITH CONTRAST TECHNIQUE: Multidetector CT imaging of the chest, abdomen and pelvis was performed following the standard protocol during bolus administration of intravenous contrast. CONTRAST:  183m OMNIPAQUE IOHEXOL 300 MG/ML  SOLN COMPARISON:  CTs 12/21/2014.  PET-CT 09/28/2014. FINDINGS: CT CHEST Mediastinum/Nodes: There are no enlarged mediastinal, hilar or axillary lymph nodes. Small mediastinal and hilar lymph nodes are unchanged, some calcified. The thyroid gland and esophagus demonstrate no significant findings. There is stable cardiomegaly and extensive atherosclerosis status post CABG. Cardiac pacemaker noted. No significant pericardial effusion. Lungs/Pleura: Interval enlargement of dependent left pleural effusion without associated pleural nodularity. The small dependent right pleural effusion appears unchanged.There has been further retraction of the dominant peripheral left upper lobe mass, now measuring approximately 2.7 x 1.7 cm on image 21 (previously 3.6 x 1.8 cm). Adjacent left upper lobe opacities radiating to the hilum are unchanged, probably related to treated tumor and/or radiation change. Extensive chronic lung disease is unchanged with scattered scarring, subpleural reticulation and diffuse interstitial prominence. No new or enlarging pulmonary nodules identified. Musculoskeletal/Chest wall: No chest wall mass or suspicious osseous findings. CT ABDOMEN AND PELVIS FINDINGS Hepatobiliary: The liver is normal in density without focal abnormality. No evidence of gallstones, gallbladder wall thickening or biliary dilatation. Pancreas: Unremarkable. No pancreatic ductal dilatation or surrounding inflammatory changes. Spleen: Normal in size without focal abnormality. Adrenals/Urinary Tract: Both  adrenal glands appear normal. The kidneys appear unchanged with cortical scarring in the upper pole of the left kidney and a small exophytic right renal cyst. The bladder appears normal. There is no hydronephrosis. Stomach/Bowel: No evidence of bowel wall thickening, distention or surrounding inflammatory change. Stable diverticular changes throughout the colon. The appendix appears normal. Vascular/Lymphatic: There are no enlarged abdominal or pelvic lymph nodes. Small lymph nodes within the porta hepatis are stable. There is stable extensive atherosclerosis of the aorta, its branches and the iliac arteries. Reproductive: Stable mild enlargement of the prostate gland. Other: No ascites or peritoneal nodularity. Mildly prominent fat in both inguinal canals is stable. Musculoskeletal: No acute or significant osseous findings. There are degenerative changes throughout the spine. IMPRESSION: 1. Further regression in the primary left upper lobe mass. Small mediastinal lymph nodes are unchanged. No evidence of disease progression. 2. Interval enlargement of left pleural effusion retaining a simple appearance by CT. The small right pleural effusion appears unchanged. 3. Grossly stable extensive chronic lung disease. 4. No suspicious findings in the abdomen or pelvis. 5. Stable extensive atherosclerosis post CABG. Electronically Signed   By: WRichardean Sale  M.D.   On: 02/08/2015 10:00   US Thoracentesis Asp Pleural Space W/img Guide  02/22/2015  CLINICAL DATA:  Lung cancer. Shortness of breath. Left pleural effusion. Request diagnostic and therapeutic left thoracentesis. EXAM: ULTRASOUND GUIDED LEFT THORACENTESIS COMPARISON:  None. PROCEDURE: An ultrasound guided thoracentesis was thoroughly discussed with the patient and questions answered. The benefits, risks, alternatives and complications were also discussed. The patient understands and wishes to proceed with the procedure. Written consent was obtained. Ultrasound  was performed to localize and mark an adequate pocket of fluid in the left chest. The area was then prepped and draped in the normal sterile fashion. 1% Lidocaine was used for local anesthesia. Under ultrasound guidance a Safe-T-Centesis catheter was introduced. Thoracentesis was performed. The catheter was removed and a dressing applied. COMPLICATIONS: None immediate. FINDINGS: A total of approximately 530 mL of clear yellow fluid was removed. A fluid sample wassent for laboratory analysis. IMPRESSION: Successful ultrasound guided left thoracentesis yielding 530 mL of pleural fluid. Read by: Ascencion Dike PA-C Electronically Signed   By: Lucrezia Europe M.D.   On: 02/22/2015 11:44    Impression:    The patient is doing satisfactorily today. He sees Dr. Julien Nordmann next week to review his last CT scan of chest that revealed further regression of the left upper lobe tumor. Small mediastinal lymph nodes were unchanged. The patient had a thoracentesis recently as well with no malignant cells identified.  We discussed the possibility of prophylactic cranial radiation once again. We can discuss this further, depending on the results of this discussion with medical oncology next week. If the decision is made for observation, then I can further discuss this with the patient.  Plan:  Follow-up on his visit with Dr. Julien Nordmann and further discuss prophylactic cranial radiation when appropriate.   Jodelle Gross, M.D., Ph.D.

## 2015-03-05 ENCOUNTER — Ambulatory Visit (HOSPITAL_BASED_OUTPATIENT_CLINIC_OR_DEPARTMENT_OTHER): Payer: Medicare Other | Admitting: Internal Medicine

## 2015-03-05 ENCOUNTER — Telehealth: Payer: Self-pay | Admitting: Internal Medicine

## 2015-03-05 ENCOUNTER — Other Ambulatory Visit (HOSPITAL_BASED_OUTPATIENT_CLINIC_OR_DEPARTMENT_OTHER): Payer: Medicare Other

## 2015-03-05 ENCOUNTER — Encounter: Payer: Self-pay | Admitting: Internal Medicine

## 2015-03-05 VITALS — BP 101/50 | HR 109 | Temp 97.9°F | Resp 18 | Ht 71.0 in | Wt 177.1 lb

## 2015-03-05 DIAGNOSIS — C3492 Malignant neoplasm of unspecified part of left bronchus or lung: Secondary | ICD-10-CM

## 2015-03-05 DIAGNOSIS — C3412 Malignant neoplasm of upper lobe, left bronchus or lung: Secondary | ICD-10-CM | POA: Diagnosis not present

## 2015-03-05 LAB — CBC WITH DIFFERENTIAL/PLATELET
BASO%: 0.5 % (ref 0.0–2.0)
BASOS ABS: 0 10*3/uL (ref 0.0–0.1)
EOS%: 2.9 % (ref 0.0–7.0)
Eosinophils Absolute: 0.2 10*3/uL (ref 0.0–0.5)
HCT: 30.7 % — ABNORMAL LOW (ref 38.4–49.9)
HEMOGLOBIN: 9.8 g/dL — AB (ref 13.0–17.1)
LYMPH%: 27 % (ref 14.0–49.0)
MCH: 29.3 pg (ref 27.2–33.4)
MCHC: 31.9 g/dL — ABNORMAL LOW (ref 32.0–36.0)
MCV: 91.8 fL (ref 79.3–98.0)
MONO#: 0.5 10*3/uL (ref 0.1–0.9)
MONO%: 7.1 % (ref 0.0–14.0)
NEUT#: 4.2 10*3/uL (ref 1.5–6.5)
NEUT%: 62.5 % (ref 39.0–75.0)
Platelets: 242 10*3/uL (ref 140–400)
RBC: 3.34 10*6/uL — ABNORMAL LOW (ref 4.20–5.82)
RDW: 17.1 % — AB (ref 11.0–14.6)
WBC: 6.8 10*3/uL (ref 4.0–10.3)
lymph#: 1.8 10*3/uL (ref 0.9–3.3)

## 2015-03-05 LAB — COMPREHENSIVE METABOLIC PANEL
ALBUMIN: 2.8 g/dL — AB (ref 3.5–5.0)
ALK PHOS: 135 U/L (ref 40–150)
ALT: 16 U/L (ref 0–55)
AST: 18 U/L (ref 5–34)
Anion Gap: 9 mEq/L (ref 3–11)
BUN: 10.9 mg/dL (ref 7.0–26.0)
CHLORIDE: 106 meq/L (ref 98–109)
CO2: 25 mEq/L (ref 22–29)
Calcium: 9.2 mg/dL (ref 8.4–10.4)
Creatinine: 0.9 mg/dL (ref 0.7–1.3)
EGFR: 83 mL/min/{1.73_m2} — ABNORMAL LOW (ref >90)
GLUCOSE: 224 mg/dL — AB (ref 70–140)
POTASSIUM: 4.5 meq/L (ref 3.5–5.1)
SODIUM: 139 meq/L (ref 136–145)
Total Bilirubin: 0.49 mg/dL (ref 0.20–1.20)
Total Protein: 7.3 g/dL (ref 6.4–8.3)

## 2015-03-05 LAB — MAGNESIUM: Magnesium: 1.8 mg/dl (ref 1.5–2.5)

## 2015-03-05 NOTE — Telephone Encounter (Signed)
Pt confirmed labs/ov per 01/31 POF, gave pt AVS and Calendar... KJ

## 2015-03-05 NOTE — Progress Notes (Signed)
[facility] [phone]   [phone]  OFFICE PROGRESS NOTE  [name], MD [place] [address]  DIAGNOSIS: Limited stage IIIB (T3, N3, M0) small cell lung cancer diagnosed in August 2016.  PRIOR THERAPY: Systemic chemotherapy was carboplatin for AUC of 5 on day 1 and etoposide 120 MG/M2 on days 1, 2 and 3 with Neulasta support on day 4. Status post 6 cycles. Last dose was given 01/21/2015,  with concurrent with radiotherapy under the care of Dr. Lisbeth Renshaw.  CURRENT THERAPY: Observation.  INTERVAL HISTORY: Karl [name] 80 y.o. male returns to the clinic today for follow-up visit accompanied by his wife and daughter. The patient tolerated the last cycle of his chemotherapy fairly well with no significant adverse effects except for fatigue. The last 2 cycles of his chemotherapy with concurrent with radiation. He lost a lot of weight during his treatment. The patient denied having any significant chest pain, shortness of breath, cough or hemoptysis. He denied having any significant night sweats. No current fever or chills, no nausea or vomiting. He had repeat CT scan of the chest performed recently and he is here for evaluation and discussion of his scan results.  MEDICAL HISTORY: Past Medical History  Diagnosis Date  . Coronary artery disease     status post CABG by Dr. Redmond Pulling in 1993 and status post percutaneous transluminal coronary angioplasty and stenting by Dr. Wynonia Lawman in 2002 and 2006  . Carotid artery occlusion     severe left internal carotid artery stenosis, asymptomatic status post carotid endarterectomy  . Hypertensive heart disease without CHF   . Carotid artery disease   . Hyperlipidemia   . ICD (implantable cardiac defibrillator) in place   . CHF (congestive heart failure) ([place])   . Anginal pain ([place])   . Pacemaker   . Type 2 diabetes mellitus with vascular disease ([place]) 12/11/2008  . H/O hiatal hernia     "gone  after my bypass"  . Arthritis     "neck, back, knees"  . Ischemic cardiomyopathy   . Atrial fibrillation ([place])   . Rheumatoid arthritis(714.0)   . AICD (automatic cardioverter/defibrillator) present   . Allergy   . Myocardial infarction ([place]) ~ 1992  . Antineoplastic chemotherapy induced anemia 12/24/2014  . Neutropenic fever ([place]) 10/11/2014  . Shortness of breath 10/24/2012    June 2014 full pulmonary function testing> ratio 86%, FEV1 2.91 L (96% predicted), no change with bronchodilator, total lung capacity 5.46 L (74% predicted) DLCO 15.09 (47% predicted) May 2014 CT chest>> there is no evidence of a pulmonary embolism, there is centrilobular emphysema seen throughout with significant paraseptal emphysema. There is also intralobular septal thickening and groundglass throughout the lungs and periphery in a fairly diffuse pattern. There no prior studies for further evaluation. 11/2012 CT Chest > no comment if progression from prior studies, persistent ground glass and interlobular septal thickening with emphysema   . 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 Lung cancer ([place]) 09/11/14    eft upper lobe lung  . S/P radiation therapy 01/25/15 completed    left upper lung    ALLERGIES:  is allergic to amiodarone and rosiglitazone-metformin.  MEDICATIONS:  Current Outpatient Prescriptions  Medication Sig Dispense Refill  . acetaminophen (TYLENOL) 500 MG tablet Take 500 mg by mouth every 6 (six) hours as needed for mild pain, moderate pain or headache. Reported on 01/18/2015    . benzonatate (TESSALON) 200 MG  capsule Take 200 mg by mouth.    . carvedilol (COREG) 3.125 MG tablet Take 6.25 mg by mouth 2 (two) times daily with a meal. Reported on 01/18/2015    . chlorpheniramine-HYDROcodone (TUSSIONEX) 10-8 MG/5ML SUER Take 5 mLs by mouth every 12 (twelve) hours.  0  . Cyanocobalamin (VITAMIN B-12) 2500 MCG SUBL Place 2,500 mcg under the tongue daily.     Marland Kitchen dofetilide (TIKOSYN) 250 MCG capsule Take 250  mcg by mouth 2 (two) times daily.    Marland Kitchen guaiFENesin (MUCINEX) 600 MG 12 hr tablet Take by mouth 2 (two) times daily.    Marland Kitchen HUMALOG KWIKPEN 100 UNIT/ML KiwkPen Inject 4 Units into the skin 3 (three) times daily after meals.   6  . insulin detemir (LEVEMIR) 100 UNIT/ML injection Inject 0.2 mLs (20 Units total) into the skin daily. 10 mL 11  . Omega-3 Fatty Acids (FISH OIL) 1000 MG CAPS Take 1,000 mg by mouth 2 (two) times daily.     Marland Kitchen PRADAXA 150 MG CAPS capsule TAKE 1 CAPSULE BY MOUTH TWICE DAILY (Patient taking differently: TAKE 150 MG  BY MOUTH TWICE DAILY) 60 capsule 5  . simvastatin (ZOCOR) 40 MG tablet Take 40 mg by mouth daily.     . nitroGLYCERIN (NITROSTAT) 0.4 MG SL tablet Place 0.4 mg under the tongue every 5 (five) minutes as needed for chest pain. Reported on 03/05/2015    . prochlorperazine (COMPAZINE) 10 MG tablet Take 10 mg by mouth every 6 (six) hours as needed for nausea or vomiting. Reported on 03/05/2015    . sucralfate (CARAFATE) 1 G tablet Reported on 03/05/2015  2   No current facility-administered medications for this visit.   Facility-Administered Medications Ordered in Other Visits  Medication Dose Route Frequency Provider Last Rate Last Dose  . heparin lock flush 100 unit/mL  500 Units Intracatheter Once PRN Curt Bears, MD      . LORazepam (ATIVAN) injection 0.5 mg  0.5 mg Intravenous PRN Curt Bears, MD      . sodium chloride 0.9 % injection 10 mL  10 mL Intracatheter PRN Curt Bears, MD        SURGICAL HISTORY:  Past Surgical History  Procedure Laterality Date  . Carotid endarterectomy  2009    left  . Cardiac defibrillator placement  05/2006    [employer]   . Eye surgery  March 2013    Cataract Left eye  . Eye surgery  06/08/2011    Cataract Right eye  . Pr vein bypass graft,aorto-fem-pop    . Inguinal hernia repair  1970's    bilaterally  . Hemorrhoid surgery  1970's  . Coronary angioplasty with stent placement      "I've got 2" (08/10/11)  .  Cardioversion  08/2010  . Insert / replace / remove pacemaker  05/2006    "got a defibrillator/pacemaker" (08/10/11)  . Coronary artery bypass graft  08/1991    CABG X5  . Cardioversion  08/13/2011    Procedure: CARDIOVERSION;  Surgeon: Jacolyn Reedy, MD;  Location: [facility];  Service: Cardiovascular;  Laterality: N/A;  . Left heart catheterization with coronary/graft angiogram N/A 06/16/2012    Procedure: LEFT HEART CATHETERIZATION WITH Beatrix Fetters;  Surgeon: Jacolyn Reedy, MD;  Location: [facility] CATH [facility];  Service: Cardiovascular;  Laterality: N/A;  . Video bronchoscopy Bilateral 09/11/2014    Procedure: VIDEO BRONCHOSCOPY WITH FLUORO;  Surgeon: Juanito Doom, MD;  Location: [facility];  Service: Cardiopulmonary;  Laterality: Bilateral;  .  Cataract surgery Bilateral   . Ep implantable device N/A 11/26/2014    Procedure:  ICD Generator Removal /Can only;  Surgeon: Evans Lance, MD;  Location: [facility];  Service: Cardiovascular;  Laterality: N/A;    REVIEW OF SYSTEMS:  Constitutional: positive for fatigue Eyes: negative Ears, nose, mouth, throat, and face: negative Respiratory: negative Cardiovascular: negative Gastrointestinal: negative Genitourinary:negative Integument/breast: negative Hematologic/lymphatic: negative Musculoskeletal:negative Neurological: negative Behavioral/Psych: negative Endocrine: negative Allergic/Immunologic: negative   PHYSICAL EXAMINATION: General appearance: alert, cooperative, fatigued and no distress Head: Normocephalic, without obvious abnormality, atraumatic Neck: no adenopathy, no JVD, supple, symmetrical, trachea midline and thyroid not enlarged, symmetric, no tenderness/mass/nodules Lymph nodes: Cervical, supraclavicular, and axillary nodes normal. Resp: clear to auscultation bilaterally Back: symmetric, no curvature. ROM normal. No CVA tenderness. Cardio: regular rate and rhythm, S1, S2 normal, no murmur, click, rub or  gallop GI: soft, non-tender; bowel sounds normal; no masses,  no organomegaly Extremities: extremities normal, atraumatic, no cyanosis or edema Neurologic: Alert and oriented X 3, normal strength and tone. Normal symmetric reflexes. Normal coordination and gait  ECOG PERFORMANCE STATUS: 1 - Symptomatic but completely ambulatory  Blood pressure 101/50, pulse 109, temperature 97.9 F (36.6 C), temperature source Oral, resp. rate 18, height '5\' 11"'$  (1.803 m), weight 177 lb 1.6 oz (80.332 kg), SpO2 97 %.  LABORATORY DATA: Lab Results  Component Value Date   WBC 6.8 03/05/2015   HGB 9.8* 03/05/2015   HCT 30.7* 03/05/2015   MCV 91.8 03/05/2015   PLT 242 03/05/2015      Chemistry      Component Value Date/Time   NA 139 03/05/2015 1237   NA 136 01/10/2015 0425   K 4.5 03/05/2015 1237   K 3.5 01/10/2015 0425   CL 102 01/10/2015 0425   CO2 25 03/05/2015 1237   CO2 26 01/10/2015 0425   BUN 10.9 03/05/2015 1237   BUN 14 01/10/2015 0425   CREATININE 0.9 03/05/2015 1237   CREATININE 0.96 01/10/2015 0425      Component Value Date/Time   CALCIUM 9.2 03/05/2015 1237   CALCIUM 8.4* 01/10/2015 0425   ALKPHOS 135 03/05/2015 1237   ALKPHOS 97 10/12/2014 0353   AST 18 03/05/2015 1237   AST 41 10/12/2014 0353   ALT 16 03/05/2015 1237   ALT 61 10/12/2014 0353   BILITOT 0.49 03/05/2015 1237   BILITOT 0.8 10/12/2014 0353       RADIOGRAPHIC STUDIES: Dg Chest 1 View  02/22/2015  CLINICAL DATA:  Status post thoracentesis. EXAM: CHEST 1 VIEW COMPARISON:  CT 02/08/2015.  Radiographs 01/09/2015. FINDINGS: 1142 hour. The left pleural effusion has decreased in volume. There is no evidence of pneumothorax. Retained left AICD lead is unchanged. The heart size is stable status post median sternotomy and CABG. Residual left upper lobe mass is less dense than on prior radiographs. Extensive chronic lung disease with subpleural reticulation again noted. IMPRESSION: Decreased left pleural effusion  following left thoracentesis. No evidence of pneumothorax. Electronically Signed   By: Richardean Sale M.D.   On: 02/22/2015 12:08   Ct Chest W Contrast  02/08/2015  CLINICAL DATA:  Restaging small cell lung cancer diagnosed in August 2016. Chemotherapy and radiation therapy completed. Chronic cough. EXAM: CT CHEST, ABDOMEN, AND PELVIS WITH CONTRAST TECHNIQUE: Multidetector CT imaging of the chest, abdomen and pelvis was performed following the standard protocol during bolus administration of intravenous contrast. CONTRAST:  167m OMNIPAQUE IOHEXOL 300 MG/ML  SOLN COMPARISON:  CTs 12/21/2014.  PET-CT 09/28/2014. FINDINGS: CT  CHEST Mediastinum/Nodes: There are no enlarged mediastinal, hilar or axillary lymph nodes. Small mediastinal and hilar lymph nodes are unchanged, some calcified. The thyroid gland and esophagus demonstrate no significant findings. There is stable cardiomegaly and extensive atherosclerosis status post CABG. Cardiac pacemaker noted. No significant pericardial effusion. Lungs/Pleura: Interval enlargement of dependent left pleural effusion without associated pleural nodularity. The small dependent right pleural effusion appears unchanged.There has been further retraction of the dominant peripheral left upper lobe mass, now measuring approximately 2.7 x 1.7 cm on image 21 (previously 3.6 x 1.8 cm). Adjacent left upper lobe opacities radiating to the hilum are unchanged, probably related to treated tumor and/or radiation change. Extensive chronic lung disease is unchanged with scattered scarring, subpleural reticulation and diffuse interstitial prominence. No new or enlarging pulmonary nodules identified. Musculoskeletal/Chest wall: No chest wall mass or suspicious osseous findings. CT ABDOMEN AND PELVIS FINDINGS Hepatobiliary: The liver is normal in density without focal abnormality. No evidence of gallstones, gallbladder wall thickening or biliary dilatation. Pancreas: Unremarkable. No pancreatic  ductal dilatation or surrounding inflammatory changes. Spleen: Normal in size without focal abnormality. Adrenals/Urinary Tract: Both adrenal glands appear normal. The kidneys appear unchanged with cortical scarring in the upper pole of the left kidney and a small exophytic right renal cyst. The bladder appears normal. There is no hydronephrosis. Stomach/Bowel: No evidence of bowel wall thickening, distention or surrounding inflammatory change. Stable diverticular changes throughout the colon. The appendix appears normal. Vascular/Lymphatic: There are no enlarged abdominal or pelvic lymph nodes. Small lymph nodes within the porta hepatis are stable. There is stable extensive atherosclerosis of the aorta, its branches and the iliac arteries. Reproductive: Stable mild enlargement of the prostate gland. Other: No ascites or peritoneal nodularity. Mildly prominent fat in both inguinal canals is stable. Musculoskeletal: No acute or significant osseous findings. There are degenerative changes throughout the spine. IMPRESSION: 1. Further regression in the primary left upper lobe mass. Small mediastinal lymph nodes are unchanged. No evidence of disease progression. 2. Interval enlargement of left pleural effusion retaining a simple appearance by CT. The small right pleural effusion appears unchanged. 3. Grossly stable extensive chronic lung disease. 4. No suspicious findings in the abdomen or pelvis. 5. Stable extensive atherosclerosis post CABG. Electronically Signed   By: Richardean Sale M.D.   On: 02/08/2015 10:00   Ct Abdomen Pelvis W Contrast  02/08/2015  CLINICAL DATA:  Restaging small cell lung cancer diagnosed in August 2016. Chemotherapy and radiation therapy completed. Chronic cough. EXAM: CT CHEST, ABDOMEN, AND PELVIS WITH CONTRAST TECHNIQUE: Multidetector CT imaging of the chest, abdomen and pelvis was performed following the standard protocol during bolus administration of intravenous contrast. CONTRAST:   15m OMNIPAQUE IOHEXOL 300 MG/ML  SOLN COMPARISON:  CTs 12/21/2014.  PET-CT 09/28/2014. FINDINGS: CT CHEST Mediastinum/Nodes: There are no enlarged mediastinal, hilar or axillary lymph nodes. Small mediastinal and hilar lymph nodes are unchanged, some calcified. The thyroid gland and esophagus demonstrate no significant findings. There is stable cardiomegaly and extensive atherosclerosis status post CABG. Cardiac pacemaker noted. No significant pericardial effusion. Lungs/Pleura: Interval enlargement of dependent left pleural effusion without associated pleural nodularity. The small dependent right pleural effusion appears unchanged.There has been further retraction of the dominant peripheral left upper lobe mass, now measuring approximately 2.7 x 1.7 cm on image 21 (previously 3.6 x 1.8 cm). Adjacent left upper lobe opacities radiating to the hilum are unchanged, probably related to treated tumor and/or radiation change. Extensive chronic lung disease is unchanged with scattered scarring, subpleural reticulation and  diffuse interstitial prominence. No new or enlarging pulmonary nodules identified. Musculoskeletal/Chest wall: No chest wall mass or suspicious osseous findings. CT ABDOMEN AND PELVIS FINDINGS Hepatobiliary: The liver is normal in density without focal abnormality. No evidence of gallstones, gallbladder wall thickening or biliary dilatation. Pancreas: Unremarkable. No pancreatic ductal dilatation or surrounding inflammatory changes. Spleen: Normal in size without focal abnormality. Adrenals/Urinary Tract: Both adrenal glands appear normal. The kidneys appear unchanged with cortical scarring in the upper pole of the left kidney and a small exophytic right renal cyst. The bladder appears normal. There is no hydronephrosis. Stomach/Bowel: No evidence of bowel wall thickening, distention or surrounding inflammatory change. Stable diverticular changes throughout the colon. The appendix appears normal.  Vascular/Lymphatic: There are no enlarged abdominal or pelvic lymph nodes. Small lymph nodes within the porta hepatis are stable. There is stable extensive atherosclerosis of the aorta, its branches and the iliac arteries. Reproductive: Stable mild enlargement of the prostate gland. Other: No ascites or peritoneal nodularity. Mildly prominent fat in both inguinal canals is stable. Musculoskeletal: No acute or significant osseous findings. There are degenerative changes throughout the spine. IMPRESSION: 1. Further regression in the primary left upper lobe mass. Small mediastinal lymph nodes are unchanged. No evidence of disease progression. 2. Interval enlargement of left pleural effusion retaining a simple appearance by CT. The small right pleural effusion appears unchanged. 3. Grossly stable extensive chronic lung disease. 4. No suspicious findings in the abdomen or pelvis. 5. Stable extensive atherosclerosis post CABG. Electronically Signed   By: Richardean Sale M.D.   On: 02/08/2015 10:00   US Thoracentesis Asp Pleural Space W/img Guide  02/22/2015  CLINICAL DATA:  Lung cancer. Shortness of breath. Left pleural effusion. Request diagnostic and therapeutic left thoracentesis. EXAM: ULTRASOUND GUIDED LEFT THORACENTESIS COMPARISON:  None. PROCEDURE: An ultrasound guided thoracentesis was thoroughly discussed with the patient and questions answered. The benefits, risks, alternatives and complications were also discussed. The patient understands and wishes to proceed with the procedure. Written consent was obtained. Ultrasound was performed to localize and mark an adequate pocket of fluid in the left chest. The area was then prepped and draped in the normal sterile fashion. 1% Lidocaine was used for local anesthesia. Under ultrasound guidance a Safe-T-Centesis catheter was introduced. Thoracentesis was performed. The catheter was removed and a dressing applied. COMPLICATIONS: None immediate. FINDINGS: A total of  approximately 530 mL of clear yellow fluid was removed. A fluid sample wassent for laboratory analysis. IMPRESSION: Successful ultrasound guided left thoracentesis yielding 530 mL of pleural fluid. Read by: Ascencion Dike PA-C Electronically Signed   By: Lucrezia Europe M.D.   On: 02/22/2015 11:44    ASSESSMENT AND PLAN: This is a very pleasant 80 years old white male with limited stage small cell lung cancer currently undergoing systemic chemotherapy with carboplatin and etoposide status post 6 cycles concurrent with radiation during the last 2 cycles. He tolerated the last cycle of his treatment fairly well except for fatigue. The recent CT scan of the chest showed further improvement of his disease. I discussed the scan results with the patient and his family. I recommended for him to discuss with Dr. Lisbeth Renshaw prophylactic cranial irradiation. I will see the patient back for follow-up visit in 3 months for reevaluation after repeating CT scan of the chest. The patient was advised to call immediately if he has any concerning symptoms in the interval. The patient voices understanding of current disease status and treatment options and is in agreement with the current  care plan.  All questions were answered. The patient knows to call the clinic with any problems, questions or concerns. We can certainly see the patient much sooner if necessary.  Disclaimer: This note was dictated with voice recognition software. Similar sounding words can inadvertently be transcribed and may not be corrected upon review.

## 2015-03-06 NOTE — Progress Notes (Signed)
  Radiation Oncology         [phone] ________________________________  Name: CARDELL RACHEL MRN: [id number]  Date: 01/25/2015  DOB: [birth year]  End of Treatment Note  Diagnosis:    Small cell carcinoma of left lung ([place])   Staging form: Lung, AJCC 7th Edition     Clinical stage from 10/01/2014: Stage IIIB (T3, N3, M0) - Unsigned       Staging comments: Small cell      Indication for treatment::  curative       Radiation treatment dates:   12/13/2014 through 01/25/2015  Site/dose:   The patient was treated to the gross disease within the chest using a IMRT technique with concurrent chemotherapy.  the patient received a total dose of 60 gray in 30 fractions.   Narrative: The patient tolerated radiation treatment relatively well.   Esophagitis was managed symptomatically.lan: The patient has completed radiation treatment. The patient will return to radiation oncology clinic for routine followup in one month. I advised the patient to call or return sooner if they have any questions or concerns related to their recovery or treatment. ________________________________  Jodelle Gross, M.D., Ph.D.

## 2015-03-08 ENCOUNTER — Telehealth: Payer: Self-pay | Admitting: *Deleted

## 2015-03-08 ENCOUNTER — Telehealth: Payer: Self-pay | Admitting: Radiation Oncology

## 2015-03-08 NOTE — Telephone Encounter (Signed)
Patient called asking if Dr. Lisbeth Renshaw will let him get his defibrillator turned back on, will speak with MD after he gets back in office and will call patient at [phone] 10:45 AM

## 2015-03-08 NOTE — Telephone Encounter (Signed)
I called the patient to discuss PCI but had to leave a message asking him to call me back at his convenience.

## 2015-03-11 ENCOUNTER — Encounter: Payer: Self-pay | Admitting: Internal Medicine

## 2015-03-11 ENCOUNTER — Telehealth: Payer: Self-pay | Admitting: Radiation Oncology

## 2015-03-11 ENCOUNTER — Ambulatory Visit (INDEPENDENT_AMBULATORY_CARE_PROVIDER_SITE_OTHER): Payer: Medicare Other | Admitting: Internal Medicine

## 2015-03-11 VITALS — BP 100/56 | HR 116 | Ht 71.0 in | Wt 180.0 lb

## 2015-03-11 DIAGNOSIS — I255 Ischemic cardiomyopathy: Secondary | ICD-10-CM | POA: Diagnosis not present

## 2015-03-11 DIAGNOSIS — I5022 Chronic systolic (congestive) heart failure: Secondary | ICD-10-CM

## 2015-03-11 NOTE — Telephone Encounter (Signed)
I spoke with the patient who is interested in moving forward with PCI. We discussed simulation this coming Friday at 2:00.  He is planning to also  Undergo defibrillator placement with cardiology. He meets with him today 3:00. I've asked him to come have their office contact us regarding treatment planning.

## 2015-03-11 NOTE — Patient Instructions (Signed)
Medication Instructions:  Your physician recommends that you continue on your current medications as directed. Please refer to the Current Medication list given to you today.  Labwork: None ordered  Testing/Procedures: None ordered  Follow-Up: Your physician recommends that you schedule a follow-up appointment in: 5 weeks with Dr. Lovena Le.   If you need a refill on your cardiac medications before your next appointment, please call your pharmacy.  Thank you for choosing [facility]!!

## 2015-03-11 NOTE — Progress Notes (Signed)
HPI Mr. Davidian returns today for ICD followup. He is a very pleasant 80 year old man with a history of atrial fibrillation, and an ischemic cardiomyopathy, status post bypass surgery in the past. He has chronic systolic heart failure which is well compensated and class II. He has done well on dofetilide for his atrial fibrillation. He does not feel palpitations or any symptomatic atrial fib. He denies chest pain or shortness of breath or syncope. No peripheral edema. His activity appears to be limited if anything by arthritis, particularly with pain in his left knee. He has been diagnosed with lung CA and is undergoin chemo and XRT after his ICD was removed as it was located too close to his lung mass. He note that he feels better than he did 2 weeks ago. He has been found to be in atrial flutter. He does not have palpitations and he has not missed any of his tikosyn.  Allergies  Allergen Reactions  . Amiodarone Other (See Comments)    Tremor   . Rosiglitazone-Metformin Other (See Comments)    Unknown     Current Outpatient Prescriptions  Medication Sig Dispense Refill  . acetaminophen (TYLENOL) 500 MG tablet Take 500 mg by mouth every 6 (six) hours as needed for mild pain, moderate pain or headache. Reported on 01/18/2015    . carvedilol (COREG) 3.125 MG tablet Take 6.25 mg by mouth 2 (two) times daily with a meal. Reported on 01/18/2015    . Cyanocobalamin (VITAMIN B-12) 2500 MCG SUBL Place 2,500 mcg under the tongue daily.     Marland Kitchen dofetilide (TIKOSYN) 250 MCG capsule Take 250 mcg by mouth 2 (two) times daily.    Marland Kitchen guaiFENesin (MUCINEX) 600 MG 12 hr tablet Take 600 mg by mouth 2 (two) times daily as needed.     Marland Kitchen HUMALOG KWIKPEN 100 UNIT/ML KiwkPen Inject 4 Units into the skin 3 (three) times daily after meals.   6  . insulin detemir (LEVEMIR) 100 UNIT/ML injection Inject 0.2 mLs (20 Units total) into the skin daily. 10 mL 11  . Omega-3 Fatty Acids (FISH OIL) 1000 MG CAPS Take 1,000 mg by  mouth 2 (two) times daily.     Marland Kitchen PRADAXA 150 MG CAPS capsule TAKE 1 CAPSULE BY MOUTH TWICE DAILY (Patient taking differently: TAKE 150 MG  BY MOUTH TWICE DAILY) 60 capsule 5  . prochlorperazine (COMPAZINE) 10 MG tablet Take 10 mg by mouth every 6 (six) hours as needed for nausea or vomiting. Reported on 03/11/2015    . simvastatin (ZOCOR) 40 MG tablet Take 40 mg by mouth daily.     . nitroGLYCERIN (NITROSTAT) 0.4 MG SL tablet Place 0.4 mg under the tongue every 5 (five) minutes as needed for chest pain. Reported on 03/11/2015     No current facility-administered medications for this visit.   Facility-Administered Medications Ordered in Other Visits  Medication Dose Route Frequency Provider Last Rate Last Dose  . heparin lock flush 100 unit/mL  500 Units Intracatheter Once PRN Curt Bears, MD      . LORazepam (ATIVAN) injection 0.5 mg  0.5 mg Intravenous PRN Curt Bears, MD      . sodium chloride 0.9 % injection 10 mL  10 mL Intracatheter PRN Curt Bears, MD         Past Medical History  Diagnosis Date  . Coronary artery disease     status post CABG by Dr. Redmond Pulling in 1993 and status post percutaneous transluminal coronary angioplasty and stenting by Dr. Wynonia Lawman  in 2002 and 2006  . Carotid artery occlusion     severe left internal carotid artery stenosis, asymptomatic status post carotid endarterectomy  . Hypertensive heart disease without CHF   . Carotid artery disease   . Hyperlipidemia   . ICD (implantable cardiac defibrillator) in place   . CHF (congestive heart failure) ([place])   . Anginal pain ([place])   . Pacemaker   . Type 2 diabetes mellitus with vascular disease ([place]) 12/11/2008  . H/O hiatal hernia     "gone after my bypass"  . Arthritis     "neck, back, knees"  . Ischemic cardiomyopathy   . Atrial fibrillation ([place])   . Rheumatoid arthritis(714.0)   . AICD (automatic cardioverter/defibrillator) present   . Allergy   . Myocardial infarction ([place]) ~ 1992  .  Antineoplastic chemotherapy induced anemia 12/24/2014  . Neutropenic fever ([place]) 10/11/2014  . Shortness of breath 10/24/2012    June 2014 full pulmonary function testing> ratio 86%, FEV1 2.91 L (96% predicted), no change with bronchodilator, total lung capacity 5.46 L (74% predicted) DLCO 15.09 (47% predicted) May 2014 CT chest>> there is no evidence of a pulmonary embolism, there is centrilobular emphysema seen throughout with significant paraseptal emphysema. There is also intralobular septal thickening and groundglass throughout the lungs and periphery in a fairly diffuse pattern. There no prior studies for further evaluation. 11/2012 CT Chest > no comment if progression from prior studies, persistent ground glass and interlobular septal thickening with emphysema   . 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 Lung cancer ([place]) 09/11/14    eft upper lobe lung  . S/P radiation therapy 01/25/15 completed    left upper lung    ROS:   All systems reviewed and negative except as noted in the HPI.   Past Surgical History  Procedure Laterality Date  . Carotid endarterectomy  2009    left  . Cardiac defibrillator placement  05/2006    St Jude   . Eye surgery  March 2013    Cataract Left eye  . Eye surgery  06/08/2011    Cataract Right eye  . Pr vein bypass graft,aorto-fem-pop    . Inguinal hernia repair  1970's    bilaterally  . Hemorrhoid surgery  1970's  . Coronary angioplasty with stent placement      "I've got 2" (08/10/11)  . Cardioversion  08/2010  . Insert / replace / remove pacemaker  05/2006    "got a defibrillator/pacemaker" (08/10/11)  . Coronary artery bypass graft  08/1991    CABG X5  . Cardioversion  08/13/2011    Procedure: CARDIOVERSION;  Surgeon: Jacolyn Reedy, MD;  Location: [facility];  Service: Cardiovascular;  Laterality: N/A;  . Left heart catheterization with coronary/graft angiogram N/A 06/16/2012    Procedure: LEFT HEART CATHETERIZATION WITH Beatrix Fetters;  Surgeon:  Jacolyn Reedy, MD;  Location: [facility] CATH LAB;  Service: Cardiovascular;  Laterality: N/A;  . Video bronchoscopy Bilateral 09/11/2014    Procedure: VIDEO BRONCHOSCOPY WITH FLUORO;  Surgeon: Juanito Doom, MD;  Location: [facility];  Service: Cardiopulmonary;  Laterality: Bilateral;  . Cataract surgery Bilateral   . Ep implantable device N/A 11/26/2014    Procedure:  ICD Generator Removal /Can only;  Surgeon: Evans Lance, MD;  Location: [facility] CV LAB;  Service: Cardiovascular;  Laterality: N/A;     Family History  Problem Relation Age of Onset  . Diabetes Father   . Heart disease Father  Heart Disease before age 67  . Heart attack Father   . Stroke Mother      Social History   Social History  . Marital Status: Married    Spouse Name: N/A  . Number of Children: N/A  . Years of Education: N/A   Occupational History  . Not on file.   Social History Main Topics  . Smoking status: Former Smoker -- 1.00 packs/day for 26 years    Types: Cigarettes    Quit date: 10/20/1980  . Smokeless tobacco: Former Systems developer    Types: Chew    Quit date: 04/02/2013  . Alcohol Use: 1.2 oz/week    2 Cans of beer per week     Comment: per week stopped alcohol 2 years ago 1-2 beers occasuionally  . Drug Use: No  . Sexual Activity: Not Currently   Other Topics Concern  . Not on file   Social History Narrative     BP 100/56 mmHg  Pulse 116  Ht '5\' 11"'$  (1.803 m)  Wt 180 lb (81.647 kg)  BMI 25.12 kg/m2  Physical Exam:  stable appearing 80 year old man, NAD HEENT: Unremarkable Neck:  7 cm JVD, no thyromegally Lungs:  Clear with no wheezes, rales, or rhonchi. HEART:  Regular rate rhythm, no murmurs, no rubs, no clicks Abd:  soft, positive bowel sounds, no organomegally, no rebound, no guarding Ext:  2 plus pulses, no edema, no cyanosis, no clubbing Skin:  No rashes no nodules Neuro:  CN II through XII intact, motor grossly intact  ECG - atrial flutter with 2:1 AV conduction    Assess/Plan:  1. ICM - he denies anginal symptoms. He will continue his current meds 2. Chronic systolic heart failure - his symptoms are class 2. No change in meds. He will maintain a low sodium diet.  3. Atrial fib - he is on Tikosyn 4. Atrial flutter - this is a new problem. He is minimally symptomatic and is on anti-coagulation. I will ask him to return in 4-5 weeks. If he remains in flutter then I would sugest an ablation.  5. Lung CA - he is pending another CT scan in 3 months. If his disease is in remission we will consider another ICD. If he has evidence of active disease, then I think primary prevention re-implant for preventing sudden death seems inappropriate. I have discussed all of this with the patient.   Mikle Bosworth.D.

## 2015-03-15 ENCOUNTER — Ambulatory Visit
Admission: RE | Admit: 2015-03-15 | Discharge: 2015-03-15 | Disposition: A | Discharge status: Home / Self Care | Payer: Medicare Other | Source: Ambulatory Visit | Attending: Radiation Oncology | Admitting: Radiation Oncology

## 2015-03-15 DIAGNOSIS — E785 Hyperlipidemia, unspecified: Secondary | ICD-10-CM | POA: Diagnosis not present

## 2015-03-15 DIAGNOSIS — E119 Type 2 diabetes mellitus without complications: Secondary | ICD-10-CM | POA: Diagnosis not present

## 2015-03-15 DIAGNOSIS — Z950 Presence of cardiac pacemaker: Secondary | ICD-10-CM | POA: Insufficient documentation

## 2015-03-15 DIAGNOSIS — I2510 Atherosclerotic heart disease of native coronary artery without angina pectoris: Secondary | ICD-10-CM | POA: Diagnosis not present

## 2015-03-15 DIAGNOSIS — I4891 Unspecified atrial fibrillation: Secondary | ICD-10-CM | POA: Insufficient documentation

## 2015-03-15 DIAGNOSIS — C3402 Malignant neoplasm of left main bronchus: Secondary | ICD-10-CM | POA: Diagnosis present

## 2015-03-15 DIAGNOSIS — I119 Hypertensive heart disease without heart failure: Secondary | ICD-10-CM | POA: Insufficient documentation

## 2015-03-15 DIAGNOSIS — C3412 Malignant neoplasm of upper lobe, left bronchus or lung: Secondary | ICD-10-CM | POA: Insufficient documentation

## 2015-03-15 DIAGNOSIS — Z95810 Presence of automatic (implantable) cardiac defibrillator: Secondary | ICD-10-CM | POA: Diagnosis not present

## 2015-03-15 DIAGNOSIS — Z510 Encounter for antineoplastic radiation therapy: Secondary | ICD-10-CM | POA: Insufficient documentation

## 2015-03-15 DIAGNOSIS — Z85828 Personal history of other malignant neoplasm of skin: Secondary | ICD-10-CM | POA: Diagnosis not present

## 2015-03-15 NOTE — Progress Notes (Signed)
  Radiation Oncology         [phone] ________________________________  Name: Karl [name] MRN: [id number]  Date: 03/15/2015  DOB: [birth year]  SIMULATION AND TREATMENT PLANNING NOTE   DIAGNOSIS:     ICD-9-CM ICD-10-CM   1. Malignant neoplasm of bronchus of left upper lobe ([place]) 162.3 C34.12      The patient presented for simulation for the patient's upcoming course of whole brain radiation treatment. This will be for a course of prophylactic cranial irradiation. The patient was placed in a supine position and a customized thermoplastic head cast was constructed to aid in patient immobilization during the treatment. This complex treatment device will be used on a daily basis. In this fashion a CT scan was obtained through the head and neck region and isocenter was placed near midline within the brain.  The patient will be planned to receive a course of whole brain radiation treatment to a dose of 25 gray in 10 fractions at 2.5 gray per fraction. To accomplish this, 2 customized blocks have been designed which corresponds to left and right whole brain radiation fields. These 2 complex treatment devices will be used on a daily basis during the course of radiation. A complex isodose plan is requested to insure that the target area is adequately covered in to facilitate optimization of the treatment plan. A forward planning technique will also be evaluated to determine if this approach significantly improves the plan.    ________________________________   Jodelle Gross, MD, PhD

## 2015-03-20 DIAGNOSIS — C3412 Malignant neoplasm of upper lobe, left bronchus or lung: Secondary | ICD-10-CM | POA: Diagnosis not present

## 2015-03-25 ENCOUNTER — Ambulatory Visit
Admission: RE | Admit: 2015-03-25 | Discharge: 2015-03-25 | Disposition: A | Discharge status: Home / Self Care | Payer: Medicare Other | Source: Ambulatory Visit | Attending: Radiation Oncology | Admitting: Radiation Oncology

## 2015-03-25 ENCOUNTER — Ambulatory Visit: Admission: RE | Admit: 2015-03-25 | Payer: Medicare Other | Source: Ambulatory Visit | Admitting: Radiation Oncology

## 2015-03-26 ENCOUNTER — Ambulatory Visit
Admission: RE | Admit: 2015-03-26 | Discharge: 2015-03-26 | Disposition: A | Discharge status: Home / Self Care | Payer: Medicare Other | Source: Ambulatory Visit | Attending: Radiation Oncology | Admitting: Radiation Oncology

## 2015-03-26 DIAGNOSIS — C3412 Malignant neoplasm of upper lobe, left bronchus or lung: Secondary | ICD-10-CM | POA: Diagnosis not present

## 2015-03-27 ENCOUNTER — Ambulatory Visit
Admission: RE | Admit: 2015-03-27 | Discharge: 2015-03-27 | Disposition: A | Discharge status: Home / Self Care | Payer: Medicare Other | Source: Ambulatory Visit | Attending: Radiation Oncology | Admitting: Radiation Oncology

## 2015-03-27 DIAGNOSIS — C3412 Malignant neoplasm of upper lobe, left bronchus or lung: Secondary | ICD-10-CM | POA: Diagnosis not present

## 2015-03-28 ENCOUNTER — Ambulatory Visit
Admission: RE | Admit: 2015-03-28 | Discharge: 2015-03-28 | Disposition: A | Discharge status: Home / Self Care | Payer: Medicare Other | Source: Ambulatory Visit | Attending: Radiation Oncology | Admitting: Radiation Oncology

## 2015-03-28 DIAGNOSIS — C3412 Malignant neoplasm of upper lobe, left bronchus or lung: Secondary | ICD-10-CM | POA: Diagnosis not present

## 2015-03-29 ENCOUNTER — Ambulatory Visit
Admission: RE | Admit: 2015-03-29 | Discharge: 2015-03-29 | Disposition: A | Discharge status: Home / Self Care | Payer: Medicare Other | Source: Ambulatory Visit | Attending: Radiation Oncology | Admitting: Radiation Oncology

## 2015-03-29 ENCOUNTER — Encounter: Payer: Self-pay | Admitting: Radiation Oncology

## 2015-03-29 VITALS — BP 111/59 | HR 106 | Temp 97.4°F | Resp 18 | Ht 71.0 in | Wt 175.7 lb

## 2015-03-29 DIAGNOSIS — C3412 Malignant neoplasm of upper lobe, left bronchus or lung: Secondary | ICD-10-CM

## 2015-03-29 NOTE — Progress Notes (Signed)
Karl [name] has received 4 fractions to his brain.  Denies any pain today.  Skin to his head without change.  Appetite is altered unable to eat very much take a few bits and feel full since starting radiation in October.  Denies any problems swallowing or pain when swallowing.  Energy level is low.  Having some SOB with exertion and resting O 2 sat 97%.  No headaches or dizziness.  Wt Readings from Last 3 Encounters:  03/29/15 175 lb 11.2 oz (79.697 kg)  03/11/15 180 lb (81.647 kg)  03/05/15 177 lb 1.6 oz (80.332 kg)  BP 111/59 mmHg  Pulse 106  Temp(Src) 97.4 F (36.3 C) (Oral)  Resp 18  Ht '5\' 11"'$  (1.803 m)  Wt 175 lb 11.2 oz (79.697 kg)  BMI 24.52 kg/m2  SpO2 97%

## 2015-03-29 NOTE — Progress Notes (Signed)
Department of Radiation Oncology  Phone:  [phone] Fax:        [phone]  Weekly Treatment Note    Name: Karl [name] Date: 03/29/2015 MRN: [id number] DOB: 1935-09-10   Diagnosis:     ICD-9-CM ICD-10-CM   1. Malignant neoplasm of bronchus of left upper lobe ([facility]) 162.3 C34.12      Current dose: 10 Gy  Current fraction: 4   MEDICATIONS: Current Outpatient Prescriptions  Medication Sig Dispense Refill  . acetaminophen (TYLENOL) 500 MG tablet Take 500 mg by mouth every 6 (six) hours as needed for mild pain, moderate pain or headache. Reported on 01/18/2015    . carvedilol (COREG) 3.125 MG tablet Take 6.25 mg by mouth 2 (two) times daily with a meal. Reported on 01/18/2015    . Cyanocobalamin (VITAMIN B-12) 2500 MCG SUBL Place 2,500 mcg under the tongue daily.     Marland Kitchen dofetilide (TIKOSYN) 250 MCG capsule Take 250 mcg by mouth 2 (two) times daily.    Marland Kitchen guaiFENesin (MUCINEX) 600 MG 12 hr tablet Take 600 mg by mouth 2 (two) times daily as needed.     Marland Kitchen HUMALOG KWIKPEN 100 UNIT/ML KiwkPen Inject 4 Units into the skin 3 (three) times daily after meals.   6  . insulin detemir (LEVEMIR) 100 UNIT/ML injection Inject 0.2 mLs (20 Units total) into the skin daily. 10 mL 11  . Omega-3 Fatty Acids (FISH OIL) 1000 MG CAPS Take 1,000 mg by mouth 2 (two) times daily.     Marland Kitchen PRADAXA 150 MG CAPS capsule TAKE 1 CAPSULE BY MOUTH TWICE DAILY (Patient taking differently: TAKE 150 MG  BY MOUTH TWICE DAILY) 60 capsule 5  . prochlorperazine (COMPAZINE) 10 MG tablet Take 10 mg by mouth every 6 (six) hours as needed for nausea or vomiting. Reported on 03/11/2015    . simvastatin (ZOCOR) 40 MG tablet Take 40 mg by mouth daily.     . nitroGLYCERIN (NITROSTAT) 0.4 MG SL tablet Place 0.4 mg under the tongue every 5 (five) minutes as needed for chest pain. Reported on 03/29/2015     No current facility-administered medications for this encounter.   Facility-Administered Medications Ordered in Other  Encounters  Medication Dose Route Frequency Provider Last Rate Last Dose  . heparin lock flush 100 unit/mL  500 Units Intracatheter Once PRN Curt Bears, MD      . LORazepam (ATIVAN) injection 0.5 mg  0.5 mg Intravenous PRN Curt Bears, MD      . sodium chloride 0.9 % injection 10 mL  10 mL Intracatheter PRN Curt Bears, MD         ALLERGIES: Amiodarone and Rosiglitazone-metformin   LABORATORY DATA:  Lab Results  Component Value Date   WBC 6.8 03/05/2015   HGB 9.8* 03/05/2015   HCT 30.7* 03/05/2015   MCV 91.8 03/05/2015   PLT 242 03/05/2015   Lab Results  Component Value Date   NA 139 03/05/2015   K 4.5 03/05/2015   CL 102 01/10/2015   CO2 25 03/05/2015   Lab Results  Component Value Date   ALT 16 03/05/2015   AST 18 03/05/2015   ALKPHOS 135 03/05/2015   BILITOT 0.49 03/05/2015     NARRATIVE: Karl [name] was seen today for weekly treatment management. The chart was checked and the patient's films were reviewed.  Karl [name] has received 4 fractions to his brain.  Denies any pain today.  Skin to his head without change.  Appetite is altered unable to  eat very much take a few bits and feel full since starting radiation in October.  Denies any problems swallowing or pain when swallowing.  Energy level is low.  Having some SOB with exertion and resting O 2 sat 97%.  No headaches or dizziness.  Wt Readings from Last 3 Encounters:  03/29/15 175 lb 11.2 oz (79.697 kg)  03/11/15 180 lb (81.647 kg)  03/05/15 177 lb 1.6 oz (80.332 kg)  BP 111/59 mmHg  Pulse 106  Temp(Src) 97.4 F (36.3 C) (Oral)  Resp 18  Ht '5\' 11"'$  (1.803 m)  Wt 175 lb 11.2 oz (79.697 kg)  BMI 24.52 kg/m2  SpO2 97%  PHYSICAL EXAMINATION: height is '5\' 11"'$  (1.803 m) and weight is 175 lb 11.2 oz (79.697 kg). His oral temperature is 97.4 F (36.3 C). His blood pressure is 111/59 and his pulse is 106. His respiration is 18 and oxygen saturation is 97%.        ASSESSMENT: The patient is doing  satisfactorily with treatment.  PLAN: We will continue with the patient's radiation treatment as planned.

## 2015-04-01 ENCOUNTER — Ambulatory Visit
Admission: RE | Admit: 2015-04-01 | Discharge: 2015-04-01 | Disposition: A | Discharge status: Home / Self Care | Payer: Medicare Other | Source: Ambulatory Visit | Attending: Radiation Oncology | Admitting: Radiation Oncology

## 2015-04-01 DIAGNOSIS — C3412 Malignant neoplasm of upper lobe, left bronchus or lung: Secondary | ICD-10-CM | POA: Diagnosis not present

## 2015-04-02 ENCOUNTER — Ambulatory Visit
Admission: RE | Admit: 2015-04-02 | Discharge: 2015-04-02 | Disposition: A | Discharge status: Home / Self Care | Payer: Medicare Other | Source: Ambulatory Visit | Attending: Radiation Oncology | Admitting: Radiation Oncology

## 2015-04-02 DIAGNOSIS — C3412 Malignant neoplasm of upper lobe, left bronchus or lung: Secondary | ICD-10-CM | POA: Diagnosis not present

## 2015-04-03 ENCOUNTER — Ambulatory Visit
Admission: RE | Admit: 2015-04-03 | Discharge: 2015-04-03 | Disposition: A | Discharge status: Home / Self Care | Payer: Medicare Other | Source: Ambulatory Visit | Attending: Radiation Oncology | Admitting: Radiation Oncology

## 2015-04-03 DIAGNOSIS — E785 Hyperlipidemia, unspecified: Secondary | ICD-10-CM | POA: Insufficient documentation

## 2015-04-03 DIAGNOSIS — C3490 Malignant neoplasm of unspecified part of unspecified bronchus or lung: Secondary | ICD-10-CM | POA: Diagnosis present

## 2015-04-03 DIAGNOSIS — I4891 Unspecified atrial fibrillation: Secondary | ICD-10-CM | POA: Diagnosis not present

## 2015-04-03 DIAGNOSIS — Z85828 Personal history of other malignant neoplasm of skin: Secondary | ICD-10-CM | POA: Insufficient documentation

## 2015-04-03 DIAGNOSIS — Z510 Encounter for antineoplastic radiation therapy: Secondary | ICD-10-CM | POA: Diagnosis present

## 2015-04-03 DIAGNOSIS — Z95810 Presence of automatic (implantable) cardiac defibrillator: Secondary | ICD-10-CM | POA: Diagnosis not present

## 2015-04-03 DIAGNOSIS — I509 Heart failure, unspecified: Secondary | ICD-10-CM | POA: Insufficient documentation

## 2015-04-03 DIAGNOSIS — I110 Hypertensive heart disease with heart failure: Secondary | ICD-10-CM | POA: Diagnosis not present

## 2015-04-03 DIAGNOSIS — I252 Old myocardial infarction: Secondary | ICD-10-CM | POA: Diagnosis not present

## 2015-04-03 DIAGNOSIS — I2510 Atherosclerotic heart disease of native coronary artery without angina pectoris: Secondary | ICD-10-CM | POA: Insufficient documentation

## 2015-04-03 DIAGNOSIS — E1151 Type 2 diabetes mellitus with diabetic peripheral angiopathy without gangrene: Secondary | ICD-10-CM | POA: Insufficient documentation

## 2015-04-03 DIAGNOSIS — Z950 Presence of cardiac pacemaker: Secondary | ICD-10-CM | POA: Insufficient documentation

## 2015-04-03 DIAGNOSIS — Z951 Presence of aortocoronary bypass graft: Secondary | ICD-10-CM | POA: Insufficient documentation

## 2015-04-03 DIAGNOSIS — C7802 Secondary malignant neoplasm of left lung: Secondary | ICD-10-CM | POA: Diagnosis present

## 2015-04-04 ENCOUNTER — Ambulatory Visit
Admission: RE | Admit: 2015-04-04 | Discharge: 2015-04-04 | Disposition: A | Discharge status: Home / Self Care | Payer: Medicare Other | Source: Ambulatory Visit | Attending: Radiation Oncology | Admitting: Radiation Oncology

## 2015-04-04 DIAGNOSIS — Z510 Encounter for antineoplastic radiation therapy: Secondary | ICD-10-CM | POA: Diagnosis not present

## 2015-04-05 ENCOUNTER — Ambulatory Visit
Admission: RE | Admit: 2015-04-05 | Discharge: 2015-04-05 | Disposition: A | Discharge status: Home / Self Care | Payer: Medicare Other | Source: Ambulatory Visit | Attending: Radiation Oncology | Admitting: Radiation Oncology

## 2015-04-05 ENCOUNTER — Ambulatory Visit: Payer: Medicare Other

## 2015-04-05 ENCOUNTER — Encounter: Payer: Self-pay | Admitting: Radiation Oncology

## 2015-04-05 VITALS — BP 112/64 | HR 103 | Temp 97.7°F | Resp 20 | Wt 176.9 lb

## 2015-04-05 DIAGNOSIS — C3412 Malignant neoplasm of upper lobe, left bronchus or lung: Secondary | ICD-10-CM

## 2015-04-05 DIAGNOSIS — Z510 Encounter for antineoplastic radiation therapy: Secondary | ICD-10-CM | POA: Diagnosis not present

## 2015-04-05 NOTE — Progress Notes (Signed)
Department of Radiation Oncology  Phone:  [phone] Fax:        [phone]  Weekly Treatment Note    Name: Karl [name] Date: 04/05/2015 MRN: [id number] DOB: Apr 27, 1935   Diagnosis:     ICD-9-CM ICD-10-CM   1. Malignant neoplasm of bronchus of left upper lobe ([facility]) 162.3 C34.12      Current dose: 27 Gy  Current fraction: 9   MEDICATIONS: Current Outpatient Prescriptions  Medication Sig Dispense Refill  . acetaminophen (TYLENOL) 500 MG tablet Take 500 mg by mouth every 6 (six) hours as needed for mild pain, moderate pain or headache. Reported on 01/18/2015    . carvedilol (COREG) 3.125 MG tablet Take 6.25 mg by mouth 2 (two) times daily with a meal. Reported on 01/18/2015    . Cyanocobalamin (VITAMIN B-12) 2500 MCG SUBL Place 2,500 mcg under the tongue daily.     Marland Kitchen dofetilide (TIKOSYN) 250 MCG capsule Take 250 mcg by mouth 2 (two) times daily.    Marland Kitchen guaiFENesin (MUCINEX) 600 MG 12 hr tablet Take 600 mg by mouth 2 (two) times daily as needed.     Marland Kitchen HUMALOG KWIKPEN 100 UNIT/ML KiwkPen Inject 4 Units into the skin 3 (three) times daily after meals.   6  . insulin detemir (LEVEMIR) 100 UNIT/ML injection Inject 0.2 mLs (20 Units total) into the skin daily. 10 mL 11  . nitroGLYCERIN (NITROSTAT) 0.4 MG SL tablet Place 0.4 mg under the tongue every 5 (five) minutes as needed for chest pain. Reported on 04/05/2015    . Omega-3 Fatty Acids (FISH OIL) 1000 MG CAPS Take 1,000 mg by mouth 2 (two) times daily.     Marland Kitchen PRADAXA 150 MG CAPS capsule TAKE 1 CAPSULE BY MOUTH TWICE DAILY (Patient taking differently: TAKE 150 MG  BY MOUTH TWICE DAILY) 60 capsule 5  . prochlorperazine (COMPAZINE) 10 MG tablet Take 10 mg by mouth every 6 (six) hours as needed for nausea or vomiting. Reported on 03/11/2015    . simvastatin (ZOCOR) 40 MG tablet Take 40 mg by mouth daily.      No current facility-administered medications for this encounter.   Facility-Administered Medications Ordered in Other  Encounters  Medication Dose Route Frequency Provider Last Rate Last Dose  . heparin lock flush 100 unit/mL  500 Units Intracatheter Once PRN Curt Bears, MD      . LORazepam (ATIVAN) injection 0.5 mg  0.5 mg Intravenous PRN Curt Bears, MD      . sodium chloride 0.9 % injection 10 mL  10 mL Intracatheter PRN Curt Bears, MD         ALLERGIES: Amiodarone and Rosiglitazone-metformin   LABORATORY DATA:  Lab Results  Component Value Date   WBC 6.8 03/05/2015   HGB 9.8* 03/05/2015   HCT 30.7* 03/05/2015   MCV 91.8 03/05/2015   PLT 242 03/05/2015   Lab Results  Component Value Date   NA 139 03/05/2015   K 4.5 03/05/2015   CL 102 01/10/2015   CO2 25 03/05/2015   Lab Results  Component Value Date   ALT 16 03/05/2015   AST 18 03/05/2015   ALKPHOS 135 03/05/2015   BILITOT 0.49 03/05/2015     NARRATIVE: ADEN SEK was seen today for weekly treatment management. The chart was checked and the patient's films were reviewed.  Weekly rad txs brain, 9/10 completed , scalp erythema, , c/o itching, opoor appetite but is eating smaller meals thorugh  BP 112/64 mmHg  Pulse 103  Temp(Src) 97.7 F (36.5 C) (Oral)  Resp 20  Wt 176 lb 14.4 oz (80.241 kg)  SpO2 97%  Wt Readings from Last 3 Encounters:  04/05/15 176 lb 14.4 oz (80.241 kg)  03/29/15 175 lb 11.2 oz (79.697 kg)  03/11/15 180 lb (81.647 kg)  out the day, no nausea, vision changes or dizzy ness, will give a 1 month f/u appt, gave sonafine cream for his head to use  PHYSICAL EXAMINATION: weight is 176 lb 14.4 oz (80.241 kg). His oral temperature is 97.7 F (36.5 C). His blood pressure is 112/64 and his pulse is 103. His respiration is 20 and oxygen saturation is 97%.        ASSESSMENT: The patient is doing satisfactorily with treatment.  PLAN: We will continue with the patient's radiation treatment as planned. The patient is doing very well. I will see him in 1 month after he finishes this course of radiation  treatment next week.

## 2015-04-05 NOTE — Progress Notes (Signed)
Weekly rad txs brain, 9/10 completed , scalp erythema, , c/o itching, opoor appetite but is eating smaller meals thorugh  BP 112/64 mmHg  Pulse 103  Temp(Src) 97.7 F (36.5 C) (Oral)  Resp 20  Wt 176 lb 14.4 oz (80.241 kg)  SpO2 97%  Wt Readings from Last 3 Encounters:  04/05/15 176 lb 14.4 oz (80.241 kg)  03/29/15 175 lb 11.2 oz (79.697 kg)  03/11/15 180 lb (81.647 kg)  out the day, no nausea, vision changes or dizzy ness, will give a 1 month f/u appt, gave sonafine cream for his head to use

## 2015-04-08 ENCOUNTER — Encounter: Payer: Self-pay | Admitting: Radiation Oncology

## 2015-04-08 ENCOUNTER — Ambulatory Visit
Admission: RE | Admit: 2015-04-08 | Discharge: 2015-04-08 | Disposition: A | Discharge status: Home / Self Care | Payer: Medicare Other | Source: Ambulatory Visit | Attending: Radiation Oncology | Admitting: Radiation Oncology

## 2015-04-08 DIAGNOSIS — Z510 Encounter for antineoplastic radiation therapy: Secondary | ICD-10-CM | POA: Diagnosis not present

## 2015-04-12 ENCOUNTER — Ambulatory Visit (INDEPENDENT_AMBULATORY_CARE_PROVIDER_SITE_OTHER): Payer: Medicare Other | Admitting: Pulmonary Disease

## 2015-04-12 ENCOUNTER — Encounter: Payer: Self-pay | Admitting: Pulmonary Disease

## 2015-04-12 ENCOUNTER — Encounter (INDEPENDENT_AMBULATORY_CARE_PROVIDER_SITE_OTHER): Payer: Medicare Other

## 2015-04-12 ENCOUNTER — Other Ambulatory Visit (INDEPENDENT_AMBULATORY_CARE_PROVIDER_SITE_OTHER): Payer: Medicare Other

## 2015-04-12 VITALS — BP 116/58 | HR 104 | Ht 71.0 in | Wt 176.0 lb

## 2015-04-12 DIAGNOSIS — R0600 Dyspnea, unspecified: Secondary | ICD-10-CM

## 2015-04-12 DIAGNOSIS — M0510 Rheumatoid lung disease with rheumatoid arthritis of unspecified site: Secondary | ICD-10-CM | POA: Diagnosis not present

## 2015-04-12 DIAGNOSIS — C3492 Malignant neoplasm of unspecified part of left bronchus or lung: Secondary | ICD-10-CM

## 2015-04-12 LAB — CBC
HCT: 31.6 % — ABNORMAL LOW (ref 39.0–52.0)
Hemoglobin: 10.4 g/dL — ABNORMAL LOW (ref 13.0–17.0)
MCHC: 32.9 g/dL (ref 30.0–36.0)
MCV: 87.1 fl (ref 78.0–100.0)
PLATELETS: 203 10*3/uL (ref 150.0–400.0)
RBC: 3.63 Mil/uL — ABNORMAL LOW (ref 4.22–5.81)
RDW: 16.9 % — AB (ref 11.5–15.5)
WBC: 5.4 10*3/uL (ref 4.0–10.5)

## 2015-04-12 MED ORDER — HYDROCOD POLST-CPM POLST ER 10-8 MG/5ML PO SUER: 5.0000 mL | Freq: Two times a day (BID) | ORAL | Status: AC | PRN

## 2015-04-12 NOTE — Assessment & Plan Note (Signed)
Oncology records reviewed. Hopefully the May 2017 CT chest will not show progression of disease.  Plan As above and going to modify that CT chest to be a high-resolution study

## 2015-04-12 NOTE — Progress Notes (Signed)
Subjective:    Patient ID: Karl [name], male    DOB: 12-23-1935, 80 y.o.   MRN: [id number]  Synopsis: Mr. Capistran first saw the [facility] office in the fall of 2014 for pulmonary fibrosis diagnosed in the setting of newly diagnosed rheumatoid arthritis.  CT scan findings were felt to be non-specific. He formerly smoked 1 ppd for 40 years. 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  Treated with carboplatin and etoposide and radiotherapy in late 2016 (last 01/2015) and prophylactic whole brain radiation.   HPI  Chief Complaint  Patient presents with  . Follow-up    pt unable to perform pft today d/t coughing-was unable to obtain tidal breaths.  Pt c/o sob with any exertion, loss of appetite, weight loss, sometimes prod cough with gray mucus.    [place] had carboplatin and etoposide since the last visit.  He has been through prophylactic whole brain radiation since the last visit and he is not having a lot of redness and pain in his scalp and ears.  He is struggling with cough all day long. He says that it lasts all night and he has a lot of mucus production in the mornings.  He has no appetite.  He has lost 40 pounds total.   He has plans for another san in May 2016.  He is out of breath all the time.  He says he can't "do anything" without getting short of breath.  When he is at rest he doesn't feel too short of breath.    He says that his heart is "out of rhythm.  They had to move his ICD in order to have his radiation therapy.  He says that he thinks that this is related to his worsening dyspnea.  He is taking blood thinners for this.  He really wants to have another cardioversion.    He is quite discouraged by all this.    No leg sewlling, no chest pain.    Past Medical History  Diagnosis Date  . Coronary artery disease     status post CABG by Dr. Redmond Pulling in 1993 and status post percutaneous transluminal coronary angioplasty and stenting by Dr.  Wynonia Lawman in 2002 and 2006  . Carotid artery occlusion     severe left internal carotid artery stenosis, asymptomatic status post carotid endarterectomy  . Hypertensive heart disease without CHF   . Carotid artery disease   . Hyperlipidemia   . ICD (implantable cardiac defibrillator) in place   . CHF (congestive heart failure) ([place])   . Anginal pain ([place])   . Pacemaker   . Type 2 diabetes mellitus with vascular disease ([place]) 12/11/2008  . H/O hiatal hernia     "gone after my bypass"  . Arthritis     "neck, back, knees"  . Ischemic cardiomyopathy   . Atrial fibrillation ([place])   . Rheumatoid arthritis(714.0)   . AICD (automatic cardioverter/defibrillator) present   . Allergy   . Myocardial infarction ([place]) ~ 1992  . Antineoplastic chemotherapy induced anemia 12/24/2014  . Neutropenic fever ([place]) 10/11/2014  . Shortness of breath 10/24/2012    June 2014 full pulmonary function testing> ratio 86%, FEV1 2.91 L (96% predicted), no change with bronchodilator, total lung capacity 5.46 L (74% predicted) DLCO 15.09 (47% predicted) May 2014 CT chest>> there is no evidence of a pulmonary embolism, there is centrilobular emphysema seen throughout with significant paraseptal emphysema. There is also intralobular septal thickening and  groundglass throughout the lungs and periphery in a fairly diffuse pattern. There no prior studies for further evaluation. 11/2012 CT Chest > no comment if progression from prior studies, persistent ground glass and interlobular septal thickening with emphysema   . 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 Lung cancer ([place]) 09/11/14    eft upper lobe lung  . S/P radiation therapy 01/25/15 completed    left upper lung     Review of Systems  Constitutional: Positive for fatigue. Negative for fever and chills.  HENT: Negative for congestion, nosebleeds, postnasal drip and rhinorrhea.   Respiratory: Positive for cough and shortness of breath. Negative for wheezing.    Cardiovascular: Negative for chest pain, palpitations and leg swelling.       Objective:   Physical Exam  Filed Vitals:   04/12/15 1554  BP: 116/58  Pulse: 104  Height: '5\' 11"'$  (1.803 m)  Weight: 176 lb (79.833 kg)  SpO2: 92%  RA  Gen: chronically ill appearing HEENT: NCAT, EOMi PULM: Few insp crackles throughout CV: RRR, tachy, no mgr AB: BS+, soft, nontender MSK: trace edema Neuro: Awake and alert  June 2014 full pulmonary function testing> ratio 86%, FEV1 2.91 L (96% predicted), no change with bronchodilator, total lung capacity 5.46 L (74% predicted) DLCO 15.09 (47% predicted) May 2014 CT chest>> there is no evidence of a pulmonary embolism, there is centrilobular emphysema seen throughout with significant paraseptal emphysema. There is also intralobular septal thickening and groundglass throughout the lungs and periphery in a fairly diffuse pattern. There no prior studies for further evaluation. 11/2012 CT Chest > no comment if progression from prior studies, persistent ground glass and interlobular septal thickening with emphysema; felt to be consistent with NSIP or UIP  August 2016 oncology notes reviewed where he is being planned for treatment for his small cell cancer  PET CT from August 2016 reviewed showing that the disease is limited to the left lung and likely pleural fluid  January 2017 CT chest images personally reviewed showing stable to decreased size consolidation left upper lobe with chronic lung disease (fibrotic changes and emphysema roughly stable compared to the last study, left-sided pleural effusion Next line 02/22/2015 left pleural fluid: Multiple lymphocytes, no malignant cells identified     Assessment & Plan:      Updated Medication List Outpatient Encounter Prescriptions as of 04/12/2015  Medication Sig  . carvedilol (COREG) 3.125 MG tablet Take 6.25 mg by mouth 2 (two) times daily with a meal. Reported on 01/18/2015  . Cyanocobalamin  (VITAMIN B-12) 2500 MCG SUBL Place 2,500 mcg under the tongue daily.   Marland Kitchen dofetilide (TIKOSYN) 250 MCG capsule Take 250 mcg by mouth 2 (two) times daily.  . insulin detemir (LEVEMIR) 100 UNIT/ML injection Inject 0.2 mLs (20 Units total) into the skin daily.  . nitroGLYCERIN (NITROSTAT) 0.4 MG SL tablet Place 0.4 mg under the tongue every 5 (five) minutes as needed for chest pain. Reported on 04/05/2015  . Omega-3 Fatty Acids (FISH OIL) 1000 MG CAPS Take 1,000 mg by mouth 2 (two) times daily.   Marland Kitchen PRADAXA 150 MG CAPS capsule TAKE 1 CAPSULE BY MOUTH TWICE DAILY (Patient taking differently: TAKE 150 MG  BY MOUTH TWICE DAILY)  . simvastatin (ZOCOR) 40 MG tablet Take 40 mg by mouth daily.   . chlorpheniramine-HYDROcodone (TUSSIONEX PENNKINETIC ER) 10-8 MG/5ML SUER Take 5 mLs by mouth every 12 (twelve) hours as needed for cough.  . [DISCONTINUED] acetaminophen (TYLENOL) 500 MG tablet Take  500 mg by mouth every 6 (six) hours as needed for mild pain, moderate pain or headache. Reported on 01/18/2015  . [DISCONTINUED] guaiFENesin (MUCINEX) 600 MG 12 hr tablet Take 600 mg by mouth 2 (two) times daily as needed. Reported on 04/12/2015  . [DISCONTINUED] HUMALOG KWIKPEN 100 UNIT/ML KiwkPen Inject 4 Units into the skin 3 (three) times daily after meals. Reported on 04/12/2015  . [DISCONTINUED] prochlorperazine (COMPAZINE) 10 MG tablet Take 10 mg by mouth every 6 (six) hours as needed for nausea or vomiting. Reported on 03/11/2015   Facility-Administered Encounter Medications as of 04/12/2015  Medication  . heparin lock flush 100 unit/mL  . LORazepam (ATIVAN) injection 0.5 mg  . sodium chloride 0.9 % injection 10 mL

## 2015-04-12 NOTE — Patient Instructions (Signed)
Take Tussionex twice a day as needed for cough, do not take this with alcohol, do not take this and drive Use MiraLAX daily in addition to the Ex-Lax I'm going to arrange for the CT scan you have been made to be performed as a high-resolution study I will see you back in May

## 2015-04-12 NOTE — Assessment & Plan Note (Signed)
Sadly, and addition to his rheumatoid arthritis associated interstitial lung disease he also has small cell cancer and a host of cardiac issues. All of this is contributing to dyspnea. I worry that his ongoing cough is either due to radiation fibrosis or the underlying interstitial lung disease. We tried to assess for progression with lung function testing today but he was unable to tolerate that test.  Plan: Treat cough with Tussionex Change next CT chest high resolution study to see if there is evidence of progression Consider low-dose prednisone if no improvement with Tussionex and/or if worsening findings on the high-resolution CT chest

## 2015-04-12 NOTE — Assessment & Plan Note (Signed)
This is a multifactorial problem, due to his cancer, deconditioning, cardiac disease, and underlying interstitial lung disease.  Further, he was noted to be anemic recently.  Plan: Gradually increase activity Workup for ILD as above Continue cancer management per Dr. Earlie Server CBC today to check for anemia

## 2015-04-15 ENCOUNTER — Ambulatory Visit (INDEPENDENT_AMBULATORY_CARE_PROVIDER_SITE_OTHER): Payer: Medicare Other | Admitting: Internal Medicine

## 2015-04-15 ENCOUNTER — Encounter: Payer: Self-pay | Admitting: Internal Medicine

## 2015-04-15 VITALS — BP 90/60 | HR 105 | Ht 71.0 in | Wt 179.4 lb

## 2015-04-15 DIAGNOSIS — I4891 Unspecified atrial fibrillation: Secondary | ICD-10-CM

## 2015-04-15 LAB — CBC WITH DIFFERENTIAL/PLATELET
BASOS ABS: 0 10*3/uL (ref 0.0–0.1)
BASOS PCT: 0 % (ref 0–1)
EOS ABS: 0.1 10*3/uL (ref 0.0–0.7)
EOS PCT: 2 % (ref 0–5)
HCT: 32.7 % — ABNORMAL LOW (ref 39.0–52.0)
Hemoglobin: 10.4 g/dL — ABNORMAL LOW (ref 13.0–17.0)
LYMPHS ABS: 1.7 10*3/uL (ref 0.7–4.0)
Lymphocytes Relative: 25 % (ref 12–46)
MCH: 29.1 pg (ref 26.0–34.0)
MCHC: 31.8 g/dL (ref 30.0–36.0)
MCV: 91.3 fL (ref 78.0–100.0)
MPV: 9 fL (ref 8.6–12.4)
Monocytes Absolute: 0.6 10*3/uL (ref 0.1–1.0)
Monocytes Relative: 9 % (ref 3–12)
NEUTROS PCT: 64 % (ref 43–77)
Neutro Abs: 4.3 10*3/uL (ref 1.7–7.7)
PLATELETS: 187 10*3/uL (ref 150–400)
RBC: 3.58 MIL/uL — AB (ref 4.22–5.81)
RDW: 15.7 % — AB (ref 11.5–15.5)
WBC: 6.7 10*3/uL (ref 4.0–10.5)

## 2015-04-15 LAB — BASIC METABOLIC PANEL
BUN: 14 mg/dL (ref 7–25)
CALCIUM: 8.9 mg/dL (ref 8.6–10.3)
CHLORIDE: 102 mmol/L (ref 98–110)
CO2: 29 mmol/L (ref 20–31)
CREATININE: 0.7 mg/dL (ref 0.70–1.18)
GLUCOSE: 137 mg/dL — AB (ref 65–99)
POTASSIUM: 4.4 mmol/L (ref 3.5–5.3)
SODIUM: 139 mmol/L (ref 135–146)

## 2015-04-15 NOTE — Progress Notes (Signed)
HPI Mr. Okerlund returns today for followup of atrial flutter. He is a very pleasant 80 year old man with a history of atrial fibrillation, and an ischemic cardiomyopathy, status post bypass surgery in the past. He has chronic systolic heart failure which is well compensated and had been class II. He has done well on dofetilide for his atrial fibrillation and had maintained NSR until several weeks ago when he was found to be in atrial flutter. He does not feel palpitations or any symptomatic atrial fib. He has undergone prophylactic XRT to the head and felt poorly from this. He has had trouble with anorexia. He notes that his pulse is constantly 105/min. When I saw him last he was in atrial flutter. We decided that we allow him to finish his XRT before doing anything about his flutter.  Allergies  Allergen Reactions  . Amiodarone Other (See Comments)    Tremor   . Rosiglitazone-Metformin Other (See Comments)    Unknown     Current Outpatient Prescriptions  Medication Sig Dispense Refill  . carvedilol (COREG) 3.125 MG tablet Take 6.25 mg by mouth 2 (two) times daily with a meal. Reported on 01/18/2015    . chlorpheniramine-HYDROcodone (TUSSIONEX PENNKINETIC ER) 10-8 MG/5ML SUER Take 5 mLs by mouth every 12 (twelve) hours as needed for cough. 140 mL 0  . Cyanocobalamin (VITAMIN B-12) 2500 MCG SUBL Place 2,500 mcg under the tongue daily.     Marland Kitchen dofetilide (TIKOSYN) 250 MCG capsule Take 250 mcg by mouth 2 (two) times daily.    . insulin detemir (LEVEMIR) 100 UNIT/ML injection Inject 0.2 mLs (20 Units total) into the skin daily. 10 mL 11  . nitroGLYCERIN (NITROSTAT) 0.4 MG SL tablet Place 0.4 mg under the tongue every 5 (five) minutes as needed for chest pain. Reported on 04/05/2015    . Omega-3 Fatty Acids (FISH OIL) 1000 MG CAPS Take 1,000 mg by mouth 2 (two) times daily.     Marland Kitchen PRADAXA 150 MG CAPS capsule TAKE 1 CAPSULE BY MOUTH TWICE DAILY (Patient taking differently: TAKE 150 MG  BY MOUTH TWICE  DAILY) 60 capsule 5  . simvastatin (ZOCOR) 40 MG tablet Take 40 mg by mouth daily.      No current facility-administered medications for this visit.   Facility-Administered Medications Ordered in Other Visits  Medication Dose Route Frequency Provider Last Rate Last Dose  . heparin lock flush 100 unit/mL  500 Units Intracatheter Once PRN Curt Bears, MD      . LORazepam (ATIVAN) injection 0.5 mg  0.5 mg Intravenous PRN Curt Bears, MD      . sodium chloride 0.9 % injection 10 mL  10 mL Intracatheter PRN Curt Bears, MD         Past Medical History  Diagnosis Date  . Coronary artery disease     status post CABG by Dr. Redmond Pulling in 1993 and status post percutaneous transluminal coronary angioplasty and stenting by Dr. Wynonia Lawman in 2002 and 2006  . Carotid artery occlusion     severe left internal carotid artery stenosis, asymptomatic status post carotid endarterectomy  . Hypertensive heart disease without CHF   . Carotid artery disease   . Hyperlipidemia   . ICD (implantable cardiac defibrillator) in place   . CHF (congestive heart failure) ([place])   . Anginal pain ([place])   . Pacemaker   . Type 2 diabetes mellitus with vascular disease ([place]) 12/11/2008  . H/O hiatal hernia     "gone after my bypass"  . Arthritis     "  neck, back, knees"  . Ischemic cardiomyopathy   . Atrial fibrillation ([place])   . Rheumatoid arthritis(714.0)   . AICD (automatic cardioverter/defibrillator) present   . Allergy   . Myocardial infarction ([place]) ~ 1992  . Antineoplastic chemotherapy induced anemia 12/24/2014  . Neutropenic fever ([place]) 10/11/2014  . Shortness of breath 10/24/2012    June 2014 full pulmonary function testing> ratio 86%, FEV1 2.91 L (96% predicted), no change with bronchodilator, total lung capacity 5.46 L (74% predicted) DLCO 15.09 (47% predicted) May 2014 CT chest>> there is no evidence of a pulmonary embolism, there is centrilobular emphysema seen throughout with significant paraseptal  emphysema. There is also intralobular septal thickening and groundglass throughout the lungs and periphery in a fairly diffuse pattern. There no prior studies for further evaluation. 11/2012 CT Chest > no comment if progression from prior studies, persistent ground glass and interlobular septal thickening with emphysema   . 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 Lung cancer ([place]) 09/11/14    eft upper lobe lung  . S/P radiation therapy 01/25/15 completed    left upper lung    ROS:   All systems reviewed and negative except as noted in the HPI.   Past Surgical History  Procedure Laterality Date  . Carotid endarterectomy  2009    left  . Cardiac defibrillator placement  05/2006    St Jude   . Eye surgery  March 2013    Cataract Left eye  . Eye surgery  06/08/2011    Cataract Right eye  . Pr vein bypass graft,aorto-fem-pop    . Inguinal hernia repair  1970's    bilaterally  . Hemorrhoid surgery  1970's  . Coronary angioplasty with stent placement      "I've got 2" (08/10/11)  . Cardioversion  08/2010  . Insert / replace / remove pacemaker  05/2006    "got a defibrillator/pacemaker" (08/10/11)  . Coronary artery bypass graft  08/1991    CABG X5  . Cardioversion  08/13/2011    Procedure: CARDIOVERSION;  Surgeon: Jacolyn Reedy, MD;  Location: [facility];  Service: Cardiovascular;  Laterality: N/A;  . Left heart catheterization with coronary/graft angiogram N/A 06/16/2012    Procedure: LEFT HEART CATHETERIZATION WITH Beatrix Fetters;  Surgeon: Jacolyn Reedy, MD;  [facility];  Service: Cardiovascular;  Laterality: N/A;  . Video bronchoscopy Bilateral 09/11/2014    Procedure: VIDEO BRONCHOSCOPY WITH FLUORO;  Surgeon: Juanito Doom, MD;  Location: [facility];  Service: Cardiopulmonary;  Laterality: Bilateral;  . Cataract surgery Bilateral   . Ep implantable device N/A 11/26/2014    Procedure:  ICD Generator Removal /Can only;  Surgeon: Evans Lance, MD;  Location: [facility] CV LAB;  Service: Cardiovascular;  Laterality: N/A;     Family History  Problem Relation Age of Onset  . Diabetes Father   . Heart disease Father     Heart Disease before age 37  . Heart attack Father   . Stroke Mother      Social History   Social History  . Marital Status: Married    Spouse Name: N/A  . Number of Children: N/A  . Years of Education: N/A   Occupational History  . Not on file.   Social History Main Topics  . Smoking status: Former Smoker -- 1.00 packs/day for 26 years    Types: Cigarettes    Quit date: 10/20/1980  . Smokeless tobacco: Former Systems developer    Types:  Sarina Ser    Quit date: 04/02/2013  . Alcohol Use: 1.2 oz/week    2 Cans of beer per week     Comment: per week stopped alcohol 2 years ago 1-2 beers occasuionally  . Drug Use: No  . Sexual Activity: Not Currently   Other Topics Concern  . Not on file   Social History Narrative     BP 90/60 mmHg  Pulse 105  Ht '5\' 11"'$  (1.803 m)  Wt 179 lb 6.4 oz (81.375 kg)  BMI 25.03 kg/m2  Physical Exam:  stable but chronically ill appearing 80 year old man, NAD HEENT: Unremarkable Neck:  7 cm JVD, no thyromegally Lungs:  Clear with no wheezes, rales, or rhonchi. HEART:  Regular rate rhythm, no murmurs, no rubs, no clicks, wearing his life vest. Abd:  soft, positive bowel sounds, no organomegally, no rebound, no guarding Ext:  2 plus pulses, no edema, no cyanosis, no clubbing Skin:  No rashes no nodules Neuro:  CN II through XII intact, motor grossly intact  ECG - atrial flutter with 2:1 AV conduction   Assess/Plan:  1. ICM - he denies anginal symptoms. He will continue his current meds 2. Chronic systolic heart failure - his symptoms are class 2. No change in meds. He will maintain a low sodium diet.  3. Atrial fib - he is on Tikosyn 4. Atrial flutter - this is a persistent problem. He is minimally symptomatic with regard to palpitations and is on anti-coagulation. We discussed the pro's  con's of catheter ablation. He would like to proceed.  5. Lung CA - he is pending another CT scan in 1-2 months. If his disease is in remission we will consider another ICD. If he has evidence of active disease, then I think primary prevention re-implant for preventing sudden death would be contra-indicated.  Mikle Bosworth.D.

## 2015-04-15 NOTE — Patient Instructions (Addendum)
Medication Instructions:  Your physician recommends that you continue on your current medications as directed. Please refer to the Current Medication list given to you today.   Labwork: Your physician recommends that you return for lab work in: TODAY   Testing/Procedures: Your physician has recommended that you have an ablation. Catheter ablation is a medical procedure used to treat some cardiac arrhythmias (irregular heartbeats). During catheter ablation, a long, thin, flexible tube is put into a blood vessel in your groin (upper thigh), or neck. This tube is called an ablation catheter. It is then guided to your heart through the blood vessel. Radio frequency waves destroy small areas of heart tissue where abnormal heartbeats may cause an arrhythmia to start. Please see the instruction sheet given to you today. SCHEDULED ON 04/29/15---see instruction sheet  Follow up:  Will need a follow up with Dr Lovena Le 4 weeks from 04/29/15 post ablation    Any Other Special Instructions Will Be Listed Below (If Applicable).     If you need a refill on your cardiac medications before your next appointment, please call your pharmacy.

## 2015-04-23 ENCOUNTER — Telehealth: Payer: Self-pay | Admitting: Internal Medicine

## 2015-04-23 NOTE — Telephone Encounter (Signed)
New message      Calling to see if we received the fax from [employer] life [employer] last week

## 2015-04-23 NOTE — Telephone Encounter (Signed)
I was unable to locate form in office. I spoke with Marlowe Kays and paperwork is life vest renewal prescription. Marlowe Kays will refax.

## 2015-04-24 ENCOUNTER — Encounter (HOSPITAL_COMMUNITY): Payer: Self-pay | Admitting: Family Medicine

## 2015-04-24 ENCOUNTER — Emergency Department (HOSPITAL_COMMUNITY): Payer: Medicare Other

## 2015-04-24 ENCOUNTER — Inpatient Hospital Stay (HOSPITAL_COMMUNITY)
Admission: EM | Admit: 2015-04-24 | Discharge: 2015-04-26 | DRG: 180 | Disposition: A | Discharge status: Home / Self Care | Payer: Medicare Other | Attending: Internal Medicine | Admitting: Internal Medicine

## 2015-04-24 DIAGNOSIS — I2510 Atherosclerotic heart disease of native coronary artery without angina pectoris: Secondary | ICD-10-CM | POA: Diagnosis present

## 2015-04-24 DIAGNOSIS — Z7901 Long term (current) use of anticoagulants: Secondary | ICD-10-CM

## 2015-04-24 DIAGNOSIS — I252 Old myocardial infarction: Secondary | ICD-10-CM

## 2015-04-24 DIAGNOSIS — Z888 Allergy status to other drugs, medicaments and biological substances status: Secondary | ICD-10-CM

## 2015-04-24 DIAGNOSIS — Z95810 Presence of automatic (implantable) cardiac defibrillator: Secondary | ICD-10-CM | POA: Diagnosis not present

## 2015-04-24 DIAGNOSIS — Z951 Presence of aortocoronary bypass graft: Secondary | ICD-10-CM | POA: Diagnosis not present

## 2015-04-24 DIAGNOSIS — IMO0002 Reserved for concepts with insufficient information to code with codable children: Secondary | ICD-10-CM

## 2015-04-24 DIAGNOSIS — J9621 Acute and chronic respiratory failure with hypoxia: Secondary | ICD-10-CM | POA: Diagnosis present

## 2015-04-24 DIAGNOSIS — Z6827 Body mass index (BMI) 27.0-27.9, adult: Secondary | ICD-10-CM

## 2015-04-24 DIAGNOSIS — Z923 Personal history of irradiation: Secondary | ICD-10-CM | POA: Diagnosis not present

## 2015-04-24 DIAGNOSIS — Z87891 Personal history of nicotine dependence: Secondary | ICD-10-CM

## 2015-04-24 DIAGNOSIS — M0510 Rheumatoid lung disease with rheumatoid arthritis of unspecified site: Secondary | ICD-10-CM | POA: Diagnosis not present

## 2015-04-24 DIAGNOSIS — Z9221 Personal history of antineoplastic chemotherapy: Secondary | ICD-10-CM

## 2015-04-24 DIAGNOSIS — I110 Hypertensive heart disease with heart failure: Secondary | ICD-10-CM | POA: Diagnosis present

## 2015-04-24 DIAGNOSIS — E1159 Type 2 diabetes mellitus with other circulatory complications: Secondary | ICD-10-CM | POA: Diagnosis present

## 2015-04-24 DIAGNOSIS — Z794 Long term (current) use of insulin: Secondary | ICD-10-CM

## 2015-04-24 DIAGNOSIS — Z85828 Personal history of other malignant neoplasm of skin: Secondary | ICD-10-CM | POA: Diagnosis not present

## 2015-04-24 DIAGNOSIS — C3412 Malignant neoplasm of upper lobe, left bronchus or lung: Principal | ICD-10-CM | POA: Diagnosis present

## 2015-04-24 DIAGNOSIS — E43 Unspecified severe protein-calorie malnutrition: Secondary | ICD-10-CM | POA: Insufficient documentation

## 2015-04-24 DIAGNOSIS — I480 Paroxysmal atrial fibrillation: Secondary | ICD-10-CM | POA: Diagnosis not present

## 2015-04-24 DIAGNOSIS — E785 Hyperlipidemia, unspecified: Secondary | ICD-10-CM | POA: Diagnosis present

## 2015-04-24 DIAGNOSIS — K5900 Constipation, unspecified: Secondary | ICD-10-CM | POA: Diagnosis not present

## 2015-04-24 DIAGNOSIS — J8410 Pulmonary fibrosis, unspecified: Secondary | ICD-10-CM | POA: Diagnosis present

## 2015-04-24 DIAGNOSIS — R0902 Hypoxemia: Secondary | ICD-10-CM

## 2015-04-24 DIAGNOSIS — E1165 Type 2 diabetes mellitus with hyperglycemia: Secondary | ICD-10-CM

## 2015-04-24 DIAGNOSIS — I4891 Unspecified atrial fibrillation: Secondary | ICD-10-CM | POA: Diagnosis present

## 2015-04-24 DIAGNOSIS — J90 Pleural effusion, not elsewhere classified: Secondary | ICD-10-CM

## 2015-04-24 DIAGNOSIS — I255 Ischemic cardiomyopathy: Secondary | ICD-10-CM | POA: Diagnosis present

## 2015-04-24 DIAGNOSIS — I509 Heart failure, unspecified: Secondary | ICD-10-CM | POA: Diagnosis present

## 2015-04-24 DIAGNOSIS — Z955 Presence of coronary angioplasty implant and graft: Secondary | ICD-10-CM

## 2015-04-24 DIAGNOSIS — J910 Malignant pleural effusion: Secondary | ICD-10-CM | POA: Diagnosis present

## 2015-04-24 DIAGNOSIS — C3492 Malignant neoplasm of unspecified part of left bronchus or lung: Secondary | ICD-10-CM | POA: Diagnosis not present

## 2015-04-24 DIAGNOSIS — I4892 Unspecified atrial flutter: Secondary | ICD-10-CM | POA: Diagnosis present

## 2015-04-24 DIAGNOSIS — R0602 Shortness of breath: Secondary | ICD-10-CM | POA: Diagnosis present

## 2015-04-24 DIAGNOSIS — L8990 Pressure ulcer of unspecified site, unspecified stage: Secondary | ICD-10-CM | POA: Insufficient documentation

## 2015-04-24 LAB — I-STAT TROPONIN, ED: TROPONIN I, POC: 0 ng/mL (ref 0.00–0.08)

## 2015-04-24 LAB — GLUCOSE, CAPILLARY: Glucose-Capillary: 128 mg/dL — ABNORMAL HIGH (ref 65–99)

## 2015-04-24 LAB — BASIC METABOLIC PANEL
Anion gap: 10 (ref 5–15)
BUN: 9 mg/dL (ref 6–20)
CO2: 25 mmol/L (ref 22–32)
CREATININE: 0.79 mg/dL (ref 0.61–1.24)
Calcium: 9.1 mg/dL (ref 8.9–10.3)
Chloride: 101 mmol/L (ref 101–111)
Glucose, Bld: 176 mg/dL — ABNORMAL HIGH (ref 65–99)
POTASSIUM: 3.9 mmol/L (ref 3.5–5.1)
Sodium: 136 mmol/L (ref 135–145)

## 2015-04-24 LAB — CBC
HCT: 34.4 % — ABNORMAL LOW (ref 39.0–52.0)
Hemoglobin: 11 g/dL — ABNORMAL LOW (ref 13.0–17.0)
MCH: 28.6 pg (ref 26.0–34.0)
MCHC: 32 g/dL (ref 30.0–36.0)
MCV: 89.4 fL (ref 78.0–100.0)
PLATELETS: 168 10*3/uL (ref 150–400)
RBC: 3.85 MIL/uL — AB (ref 4.22–5.81)
RDW: 15.8 % — AB (ref 11.5–15.5)
WBC: 6.1 10*3/uL (ref 4.0–10.5)

## 2015-04-24 LAB — BRAIN NATRIURETIC PEPTIDE: B NATRIURETIC PEPTIDE 5: 343.8 pg/mL — AB (ref 0.0–100.0)

## 2015-04-24 MED ORDER — IOHEXOL 300 MG/ML  SOLN
75.0000 mL | Freq: Once | INTRAMUSCULAR | Status: AC | PRN
  Administered 2015-04-24: 75 mL via INTRAVENOUS

## 2015-04-24 MED ORDER — SODIUM CHLORIDE 0.9% FLUSH: 3.0000 mL | INTRAVENOUS | Status: DC | PRN

## 2015-04-24 MED ORDER — POLYETHYLENE GLYCOL 3350 17 G PO PACK: 17.0000 g | PACK | Freq: Every day | ORAL | Status: DC | PRN

## 2015-04-24 MED ORDER — LINACLOTIDE 145 MCG PO CAPS
145.0000 ug | ORAL_CAPSULE | Freq: Every day | ORAL | Status: DC
  Administered 2015-04-25 – 2015-04-26 (×2): 145 ug via ORAL
  Filled 2015-04-24 (×4): qty 1

## 2015-04-24 MED ORDER — ACETAMINOPHEN 325 MG PO TABS: 650.0000 mg | ORAL_TABLET | Freq: Four times a day (QID) | ORAL | Status: DC | PRN

## 2015-04-24 MED ORDER — VITAMIN B-12 1000 MCG PO TABS
1000.0000 ug | ORAL_TABLET | Freq: Every day | ORAL | Status: DC
  Administered 2015-04-25 – 2015-04-26 (×2): 1000 ug via ORAL
  Filled 2015-04-24 (×2): qty 1

## 2015-04-24 MED ORDER — INSULIN ASPART 100 UNIT/ML ~~LOC~~ SOLN
0.0000 [IU] | Freq: Three times a day (TID) | SUBCUTANEOUS | Status: DC
Start: 2015-04-25 — End: 2015-04-26
  Administered 2015-04-25: 1 [IU] via SUBCUTANEOUS
  Administered 2015-04-25: 3 [IU] via SUBCUTANEOUS

## 2015-04-24 MED ORDER — ONDANSETRON HCL 4 MG/2ML IJ SOLN: 4.0000 mg | Freq: Four times a day (QID) | INTRAMUSCULAR | Status: DC | PRN

## 2015-04-24 MED ORDER — FLEET ENEMA 7-19 GM/118ML RE ENEM: 1.0000 | ENEMA | Freq: Once | RECTAL | Status: DC

## 2015-04-24 MED ORDER — MORPHINE SULFATE (PF) 2 MG/ML IV SOLN: 1.0000 mg | INTRAVENOUS | Status: DC | PRN

## 2015-04-24 MED ORDER — ONDANSETRON HCL 4 MG PO TABS: 4.0000 mg | ORAL_TABLET | Freq: Four times a day (QID) | ORAL | Status: DC | PRN

## 2015-04-24 MED ORDER — CARVEDILOL 6.25 MG PO TABS
6.2500 mg | ORAL_TABLET | Freq: Two times a day (BID) | ORAL | Status: DC
  Administered 2015-04-25 – 2015-04-26 (×3): 6.25 mg via ORAL
  Filled 2015-04-24 (×3): qty 1

## 2015-04-24 MED ORDER — HYDROCOD POLST-CPM POLST ER 10-8 MG/5ML PO SUER
5.0000 mL | Freq: Two times a day (BID) | ORAL | Status: DC | PRN
Start: 2015-04-24 — End: 2015-04-26

## 2015-04-24 MED ORDER — ACETAMINOPHEN 650 MG RE SUPP
650.0000 mg | Freq: Four times a day (QID) | RECTAL | Status: DC | PRN
Start: 2015-04-24 — End: 2015-04-26

## 2015-04-24 MED ORDER — ALBUTEROL SULFATE (2.5 MG/3ML) 0.083% IN NEBU: 2.5000 mg | INHALATION_SOLUTION | RESPIRATORY_TRACT | Status: DC | PRN

## 2015-04-24 MED ORDER — OXYCODONE HCL 5 MG PO TABS: 5.0000 mg | ORAL_TABLET | ORAL | Status: DC | PRN

## 2015-04-24 MED ORDER — INSULIN DETEMIR 100 UNIT/ML ~~LOC~~ SOLN
20.0000 [IU] | Freq: Every day | SUBCUTANEOUS | Status: DC
  Administered 2015-04-25 – 2015-04-26 (×2): 20 [IU] via SUBCUTANEOUS
  Filled 2015-04-24 (×2): qty 0.2

## 2015-04-24 MED ORDER — OMEGA-3-ACID ETHYL ESTERS 1 G PO CAPS
1.0000 g | ORAL_CAPSULE | Freq: Two times a day (BID) | ORAL | Status: DC
  Administered 2015-04-24 – 2015-04-26 (×4): 1 g via ORAL
  Filled 2015-04-24 (×4): qty 1

## 2015-04-24 MED ORDER — NITROGLYCERIN 0.4 MG SL SUBL: 0.4000 mg | SUBLINGUAL_TABLET | SUBLINGUAL | Status: DC | PRN

## 2015-04-24 MED ORDER — POLYETHYLENE GLYCOL 3350 17 G PO PACK
17.0000 g | PACK | Freq: Two times a day (BID) | ORAL | Status: DC
  Administered 2015-04-24 – 2015-04-26 (×4): 17 g via ORAL
  Filled 2015-04-24 (×4): qty 1

## 2015-04-24 MED ORDER — SODIUM CHLORIDE 0.9% FLUSH
3.0000 mL | Freq: Two times a day (BID) | INTRAVENOUS | Status: DC
  Administered 2015-04-24 – 2015-04-26 (×4): 3 mL via INTRAVENOUS

## 2015-04-24 MED ORDER — SODIUM CHLORIDE 0.9 % IV SOLN: 250.0000 mL | INTRAVENOUS | Status: DC | PRN

## 2015-04-24 MED ORDER — DOFETILIDE 250 MCG PO CAPS
250.0000 ug | ORAL_CAPSULE | Freq: Two times a day (BID) | ORAL | Status: DC
  Administered 2015-04-24 – 2015-04-26 (×4): 250 ug via ORAL
  Filled 2015-04-24 (×7): qty 1

## 2015-04-24 MED ORDER — SIMVASTATIN 40 MG PO TABS
40.0000 mg | ORAL_TABLET | Freq: Every day | ORAL | Status: DC
  Administered 2015-04-25 – 2015-04-26 (×2): 40 mg via ORAL
  Filled 2015-04-24 (×2): qty 1

## 2015-04-24 NOTE — ED Provider Notes (Signed)
CSN: [id number]     Arrival date & time 04/24/15  1259 History   First MD Initiated Contact with Patient 04/24/15 1500     Chief Complaint  Patient presents with  . Shortness of Breath    HPI Comments: Pt started radiation for lung cancer in October.  He started getting short of breath when he began that radiation.  His appetite was also affected.  He was told that his breathing was affected by the cancer and the radiation.  Pt is followed by Pulmonary and has been seeing him for this problem. At one point he had an effusion and had it drained.  He saw his heart doctor last week but could not complete a test.  He saw his heart doctor for a fib recently.   Pt's sx are getting worse.  He called Dr Wynonia Lawman and was told to come to the ED.  Dr Wynonia Lawman evaluated him in the ED today and reviewed his CXR.  He will need medical admission, Chest CT and possible thoracentesis.  Patient is a 80 y.o. male presenting with shortness of breath. The history is provided by the patient.  Shortness of Breath Severity:  Severe Onset quality:  Gradual Timing:  Constant Progression:  Worsening Chronicity:  Recurrent Exacerbated by: lying flat. Associated symptoms: cough   Associated symptoms: no abdominal pain, no fever, no vomiting and no wheezing     Past Medical History  Diagnosis Date  . Coronary artery disease     status post CABG by Dr. Redmond Pulling in 1993 and status post percutaneous transluminal coronary angioplasty and stenting by Dr. Wynonia Lawman in 2002 and 2006  . Carotid artery occlusion     severe left internal carotid artery stenosis, asymptomatic status post carotid endarterectomy  . Hypertensive heart disease without CHF   . Carotid artery disease   . Hyperlipidemia   . ICD (implantable cardiac defibrillator) in place   . CHF (congestive heart failure) ([place])   . Anginal pain ([place])   . Pacemaker   . Type 2 diabetes mellitus with vascular disease ([place]) 12/11/2008  . H/O hiatal hernia     "gone after  my bypass"  . Arthritis     "neck, back, knees"  . Ischemic cardiomyopathy   . Atrial fibrillation ([place])   . Rheumatoid arthritis(714.0)   . AICD (automatic cardioverter/defibrillator) present   . Allergy   . Myocardial infarction ([place]) ~ 1992  . Antineoplastic chemotherapy induced anemia 12/24/2014  . Neutropenic fever ([place]) 10/11/2014  . Shortness of breath 10/24/2012    June 2014 full pulmonary function testing> ratio 86%, FEV1 2.91 L (96% predicted), no change with bronchodilator, total lung capacity 5.46 L (74% predicted) DLCO 15.09 (47% predicted) May 2014 CT chest>> there is no evidence of a pulmonary embolism, there is centrilobular emphysema seen throughout with significant paraseptal emphysema. There is also intralobular septal thickening and groundglass throughout the lungs and periphery in a fairly diffuse pattern. There no prior studies for further evaluation. 11/2012 CT Chest > no comment if progression from prior studies, persistent ground glass and interlobular septal thickening with emphysema   . 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 Lung cancer ([place]) 09/11/14    eft upper lobe lung  . S/P radiation therapy 01/25/15 completed    left upper lung   Past Surgical History  Procedure Laterality Date  . Carotid endarterectomy  2009    left  . Cardiac defibrillator placement  05/2006    St Jude   .  Eye surgery  March 2013    Cataract Left eye  . Eye surgery  06/08/2011    Cataract Right eye  . Pr vein bypass graft,aorto-fem-pop    . Inguinal hernia repair  1970's    bilaterally  . Hemorrhoid surgery  1970's  . Coronary angioplasty with stent placement      "I've got 2" (08/10/11)  . Cardioversion  08/2010  . Insert / replace / remove pacemaker  05/2006    "got a defibrillator/pacemaker" (08/10/11)  . Coronary artery bypass graft  08/1991    CABG X5  . Cardioversion  08/13/2011    Procedure: CARDIOVERSION;  Surgeon: Jacolyn Reedy, MD;  Location: [facility];  Service: Cardiovascular;   Laterality: N/A;  . Left heart catheterization with coronary/graft angiogram N/A 06/16/2012    Procedure: LEFT HEART CATHETERIZATION WITH Beatrix Fetters;  Surgeon: Jacolyn Reedy, MD;  Location: [facility] CATH LAB;  Service: Cardiovascular;  Laterality: N/A;  . Video bronchoscopy Bilateral 09/11/2014    Procedure: VIDEO BRONCHOSCOPY WITH FLUORO;  Surgeon: Juanito Doom, MD;  Location: [facility];  Service: Cardiopulmonary;  Laterality: Bilateral;  . Cataract surgery Bilateral   . Ep implantable device N/A 11/26/2014    Procedure:  ICD Generator Removal /Can only;  Surgeon: Evans Lance, MD;  Location: [facility] CV LAB;  Service: Cardiovascular;  Laterality: N/A;   Family History  Problem Relation Age of Onset  . Diabetes Father   . Heart disease Father     Heart Disease before age 24  . Heart attack Father   . Stroke Mother    Social History  Substance Use Topics  . Smoking status: Former Smoker -- 1.00 packs/day for 26 years    Types: Cigarettes    Quit date: 10/20/1980  . Smokeless tobacco: Former Systems developer    Types: Chew    Quit date: 04/02/2013  . Alcohol Use: 1.2 oz/week    2 Cans of beer per week     Comment: per week stopped alcohol 2 years ago 1-2 beers occasuionally    Review of Systems  Constitutional: Negative for fever.  Respiratory: Positive for cough and shortness of breath. Negative for wheezing.   Gastrointestinal: Negative for vomiting and abdominal pain.      Allergies  Amiodarone and Rosiglitazone-metformin  Home Medications   Prior to Admission medications   Medication Sig Start Date End Date Taking? Authorizing Provider  carvedilol (COREG) 3.125 MG tablet Take 6.25 mg by mouth 2 (two) times daily with a meal. Reported on 01/18/2015   Yes Historical Provider, MD  chlorpheniramine-HYDROcodone (TUSSIONEX PENNKINETIC ER) 10-8 MG/5ML SUER Take 5 mLs by mouth every 12 (twelve) hours as needed for cough. 04/12/15  Yes Juanito Doom, MD   Cyanocobalamin (VITAMIN B-12) 2500 MCG SUBL Place 2,500 mcg under the tongue daily.    Yes Historical Provider, MD  dofetilide (TIKOSYN) 250 MCG capsule Take 250 mcg by mouth 2 (two) times daily.   Yes Historical Provider, MD  ELIQUIS 5 MG TABS tablet Take 1 tablet by mouth 2 (two) times daily. 04/23/15  Yes Historical Provider, MD  insulin detemir (LEVEMIR) 100 UNIT/ML injection Inject 0.2 mLs (20 Units total) into the skin daily. 01/11/15  Yes Robbie Lis, MD  Linaclotide [facility]) 145 MCG CAPS capsule Take 145 mcg by mouth daily.   Yes Historical Provider, MD  nitroGLYCERIN (NITROSTAT) 0.4 MG SL tablet Place 0.4 mg under the tongue every 5 (five) minutes as needed for chest pain. Reported on  04/05/2015   Yes Historical Provider, MD  Omega-3 Fatty Acids (FISH OIL) 1000 MG CAPS Take 1,000 mg by mouth 2 (two) times daily.    Yes Historical Provider, MD  simvastatin (ZOCOR) 40 MG tablet Take 40 mg by mouth daily.    Yes Historical Provider, MD  PRADAXA 150 MG CAPS capsule TAKE 1 CAPSULE BY MOUTH TWICE DAILY Patient taking differently: TAKE 150 MG  BY MOUTH TWICE DAILY 03/06/13   Evans Lance, MD   BP 122/61 mmHg  Pulse 102  Temp(Src) 97.9 F (36.6 C) (Oral)  Resp 24  SpO2 96% Physical Exam  Constitutional: No distress.  HENT:  Head: Normocephalic and atraumatic.  Right Ear: External ear normal.  Left Ear: External ear normal.  Eyes: Conjunctivae are normal. Right eye exhibits no discharge. Left eye exhibits no discharge. No scleral icterus.  Neck: Neck supple. No tracheal deviation present.  Cardiovascular: Normal rate, regular rhythm and intact distal pulses.   Pulmonary/Chest: Effort normal. No stridor. No respiratory distress. He has decreased breath sounds in the left middle field and the left lower field. He has no wheezes. He has no rales.  Abdominal: Soft. Bowel sounds are normal. He exhibits no distension. There is no tenderness. There is no rebound and no guarding.   Musculoskeletal: He exhibits no edema or tenderness.  Neurological: He is alert. He has normal strength. No cranial nerve deficit (no facial droop, extraocular movements intact, no slurred speech) or sensory deficit. He exhibits normal muscle tone. He displays no seizure activity. Coordination normal.  Skin: Skin is warm and dry. No rash noted. He is not diaphoretic.  Psychiatric: He has a normal mood and affect.  Nursing note and vitals reviewed.   ED Course  Procedures (including critical care time) Labs Review Labs Reviewed  BASIC METABOLIC PANEL - Abnormal; Notable for the following:    Glucose, Bld 176 (*)    All other components within normal limits  CBC - Abnormal; Notable for the following:    RBC 3.85 (*)    Hemoglobin 11.0 (*)    HCT 34.4 (*)    RDW 15.8 (*)    All other components within normal limits  BRAIN NATRIURETIC PEPTIDE - Abnormal; Notable for the following:    B Natriuretic Peptide 343.8 (*)    All other components within normal limits  Randolm Idol, ED    Imaging Review Dg Chest 2 View  04/24/2015  CLINICAL DATA:  Shortness of breath.  Lung cancer. EXAM: CHEST  2 VIEW COMPARISON:  02/22/2015. FINDINGS: The heart is enlarged. Coronary stents are present. Single lead pacer from a LEFT subclavian approach, battery pack has been removed. The single lead remains in good position in the RIGHT ventricle. Worsening aeration with volume loss and new large LEFT pleural effusion, concerning for recurrent malignancy. Increased opacity centrally around the LEFT hilum. Chronic markings the RIGHT lung, stable. No acute osseous findings. Prior CABG. IMPRESSION: Marked worsening LEFT pleural effusion. Consider further evaluation with chest CT scan with contrast. Electronically Signed   By: Staci Righter M.D.   On: 04/24/2015 13:49   I have personally reviewed and evaluated these images and lab results as part of my medical decision-making.   MDM   Final diagnoses:   Pleural effusion  Pulmonary fibrosis ([place])    Pt CXR shows worsening pulmonary effusion.  He has a history of multiple medical problems including emphysema, CHF, lung CA and pleural effusions.  Presents with worsening dyspnea.  His pleural effusion  is increasing, and the likely cause of his dyspnea that increases when lying down.  CT scan of chest ordered.  Will plan on medical admission.  Likely will require thoracentesis.    Dorie Rank, MD 04/24/15 [phone]

## 2015-04-24 NOTE — H&P (Addendum)
PATIENT DETAILS Name: Karl [name] Age: 80 y.o. Sex: male Date of Birth: [birth year] Admit Date: 04/24/2015 [id number]:[id number],MBWG M, MD Referring Physician: Dr. Marye Round   CHIEF COMPLAINT:  Shortness of breath-to-3 weeks.  HPI: Karl [name] is a 80 y.o. male with a Past Medical History of small cell cancer of the lung status post recent for lactic radiation therapy to his brain-status post chemotherapy-currently on observation, atrial fibrillation on anticoagulation who presented to the ED with 2-3 weeks of shortness of breath. Shortness of breath is mostly exertional, he is comfortable at rest. There is no leg edema. Per patient, walking a few feet makes him short of breath, he does have 2-3 pillow orthopnea but denies PND. He denies any recent history of fever, cough or hemoptysis. Because of these worsening symptoms, he presented to the ED, where he was found to have large pleural effusion. Was subsequently asked to admit this patient for further evaluation and treatment. Please note-patient had undergone a thoracocentesis in January 2017, cytology from the fluid was negative for malignancy.   No history of fever, headache, nausea, vomiting or diarrhea.  No history of abdominal pain. No dysuria or frequency of urination.    ALLERGIES:   Allergies  Allergen Reactions  . Amiodarone Other (See Comments)    Tremor   . Rosiglitazone-Metformin Other (See Comments)    Unknown    PAST MEDICAL HISTORY: Past Medical History  Diagnosis Date  . Coronary artery disease     status post CABG by Dr. Redmond Pulling in 1993 and status post percutaneous transluminal coronary angioplasty and stenting by Dr. Wynonia Lawman in 2002 and 2006  . Carotid artery occlusion     severe left internal carotid artery stenosis, asymptomatic status post carotid endarterectomy  . Hypertensive heart disease without CHF   . Carotid artery disease   . Hyperlipidemia   . ICD (implantable cardiac defibrillator)  in place   . CHF (congestive heart failure) ([place])   . Anginal pain ([place])   . Pacemaker   . Type 2 diabetes mellitus with vascular disease ([place]) 12/11/2008  . H/O hiatal hernia     "gone after my bypass"  . Arthritis     "neck, back, knees"  . Ischemic cardiomyopathy   . Atrial fibrillation ([place])   . Rheumatoid arthritis(714.0)   . AICD (automatic cardioverter/defibrillator) present   . Allergy   . Myocardial infarction ([place]) ~ 1992  . Antineoplastic chemotherapy induced anemia 12/24/2014  . Neutropenic fever ([place]) 10/11/2014  . Shortness of breath 10/24/2012    June 2014 full pulmonary function testing> ratio 86%, FEV1 2.91 L (96% predicted), no change with bronchodilator, total lung capacity 5.46 L (74% predicted) DLCO 15.09 (47% predicted) May 2014 CT chest>> there is no evidence of a pulmonary embolism, there is centrilobular emphysema seen throughout with significant paraseptal emphysema. There is also intralobular septal thickening and groundglass throughout the lungs and periphery in a fairly diffuse pattern. There no prior studies for further evaluation. 11/2012 CT Chest > no comment if progression from prior studies, persistent ground glass and interlobular septal thickening with emphysema   . 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 Lung cancer ([place]) 09/11/14    eft upper lobe lung  . S/P radiation therapy 01/25/15 completed    left upper lung    PAST SURGICAL HISTORY: Past Surgical History  Procedure Laterality Date  . Carotid endarterectomy  2009    left  .  Cardiac defibrillator placement  05/2006    St Jude   . Eye surgery  March 2013    Cataract Left eye  . Eye surgery  06/08/2011    Cataract Right eye  . Pr vein bypass graft,aorto-fem-pop    . Inguinal hernia repair  1970's    bilaterally  . Hemorrhoid surgery  1970's  . Coronary angioplasty with stent placement      "I've got 2" (08/10/11)  . Cardioversion  08/2010  . Insert / replace / remove pacemaker  05/2006    "got a  defibrillator/pacemaker" (08/10/11)  . Coronary artery bypass graft  08/1991    CABG X5  . Cardioversion  08/13/2011    Procedure: CARDIOVERSION;  Surgeon: Jacolyn Reedy, MD;  Location: [facility];  Service: Cardiovascular;  Laterality: N/A;  . Left heart catheterization with coronary/graft angiogram N/A 06/16/2012    Procedure: LEFT HEART CATHETERIZATION WITH Beatrix Fetters;  Surgeon: Jacolyn Reedy, MD;  Location: [facility] CATH LAB;  Service: Cardiovascular;  Laterality: N/A;  . Video bronchoscopy Bilateral 09/11/2014    Procedure: VIDEO BRONCHOSCOPY WITH FLUORO;  Surgeon: Juanito Doom, MD;  Location: [facility];  Service: Cardiopulmonary;  Laterality: Bilateral;  . Cataract surgery Bilateral   . Ep implantable device N/A 11/26/2014    Procedure:  ICD Generator Removal /Can only;  Surgeon: Evans Lance, MD;  Location: [facility] CV LAB;  Service: Cardiovascular;  Laterality: N/A;    MEDICATIONS AT HOME: Prior to Admission medications   Medication Sig Start Date End Date Taking? Authorizing Provider  carvedilol (COREG) 3.125 MG tablet Take 6.25 mg by mouth 2 (two) times daily with a meal. Reported on 01/18/2015   Yes Historical Provider, MD  chlorpheniramine-HYDROcodone (TUSSIONEX PENNKINETIC ER) 10-8 MG/5ML SUER Take 5 mLs by mouth every 12 (twelve) hours as needed for cough. 04/12/15  Yes Juanito Doom, MD  Cyanocobalamin (VITAMIN B-12) 2500 MCG SUBL Place 2,500 mcg under the tongue daily.    Yes Historical Provider, MD  dofetilide (TIKOSYN) 250 MCG capsule Take 250 mcg by mouth 2 (two) times daily.   Yes Historical Provider, MD  ELIQUIS 5 MG TABS tablet Take 1 tablet by mouth 2 (two) times daily. 04/23/15  Yes Historical Provider, MD  insulin detemir (LEVEMIR) 100 UNIT/ML injection Inject 0.2 mLs (20 Units total) into the skin daily. 01/11/15  Yes Robbie Lis, MD  Linaclotide [facility]) 145 MCG CAPS capsule Take 145 mcg by mouth daily.   Yes Historical Provider, MD  nitroGLYCERIN  (NITROSTAT) 0.4 MG SL tablet Place 0.4 mg under the tongue every 5 (five) minutes as needed for chest pain. Reported on 04/05/2015   Yes Historical Provider, MD  Omega-3 Fatty Acids (FISH OIL) 1000 MG CAPS Take 1,000 mg by mouth 2 (two) times daily.    Yes Historical Provider, MD  simvastatin (ZOCOR) 40 MG tablet Take 40 mg by mouth daily.    Yes Historical Provider, MD    FAMILY HISTORY: Family History  Problem Relation Age of Onset  . Diabetes Father   . Heart disease Father     Heart Disease before age 53  . Heart attack Father   . Stroke Mother     SOCIAL HISTORY:  reports that he quit smoking about 34 years ago. His smoking use included Cigarettes. He has a 26 pack-year smoking history. He quit smokeless tobacco use about 2 years ago. His smokeless tobacco use included Chew. He reports that he drinks about 1.2 oz of alcohol  per week. He reports that he does not use illicit drugs. Lives at: Home Mobility:Independent  REVIEW OF SYSTEMS:  Constitutional:   No  weight loss, night sweats,  Fevers, chills, fatigue.  HEENT:    No headaches, Dysphagia,Tooth/dental problems,Sore throat  Cardio-vascular: No chest pain,Orthopnea, PND,lower extremity edema, anasarca, palpitations  GI:  No heartburn, indigestion, abdominal pain, nausea, vomiting, diarrhea, melena or hematochezia  Resp: No cough, hemoptysis,plueritic chest pain.   Skin:  No rash or lesions.  GU:  No dysuria, change in color of urine, no urgency or frequency.  No flank pain.  Musculoskeletal: No joint pain or swelling.  No decreased range of motion.  No back pain.  Endocrine: No heat intolerance, no cold intolerance, no polyuria, no polydipsia  Psych: No change in mood or affect. No depression or anxiety.  No memory loss.   PHYSICAL EXAM: Blood pressure 122/75, pulse 102, temperature 97.9 F (36.6 C), temperature source Oral, resp. rate 24, SpO2 95 %.  General appearance :Awake, alert, not in any distress.  Speech Clear. Not toxic Looking HEENT: Atraumatic and Normocephalic, pupils equally reactive to light and accomodation Neck: supple, no JVD. No cervical lymphadenopathy.  Chest:decreased air entry at the left base to mid lung area. But no added sounds.  CVS: S1 S2 regular, no murmurs.  Abdomen: Bowel sounds present, Non tender and not distended with no gaurding, rigidity or rebound. Extremities: B/L Lower Ext shows no edema, both legs are warm to touch Neurology:  Non focal Skin:No Rash Wounds:N/A  LABS ON ADMISSION:   Recent Labs  04/24/15 1322  NA 136  K 3.9  CL 101  CO2 25  GLUCOSE 176*  BUN 9  CREATININE 0.79  CALCIUM 9.1   No results for input(s): AST, ALT, ALKPHOS, BILITOT, PROT, ALBUMIN in the last 72 hours. No results for input(s): LIPASE, AMYLASE in the last 72 hours.  Recent Labs  04/24/15 1322  WBC 6.1  HGB 11.0*  HCT 34.4*  MCV 89.4  PLT 168   No results for input(s): CKTOTAL, CKMB, CKMBINDEX, TROPONINI in the last 72 hours. No results for input(s): DDIMER in the last 72 hours. Invalid input(s): POCBNP   RADIOLOGIC STUDIES ON ADMISSION: Dg Chest 2 View  04/24/2015  CLINICAL DATA:  Shortness of breath.  Lung cancer. EXAM: CHEST  2 VIEW COMPARISON:  02/22/2015. FINDINGS: The heart is enlarged. Coronary stents are present. Single lead pacer from a LEFT subclavian approach, battery pack has been removed. The single lead remains in good position in the RIGHT ventricle. Worsening aeration with volume loss and new large LEFT pleural effusion, concerning for recurrent malignancy. Increased opacity centrally around the LEFT hilum. Chronic markings the RIGHT lung, stable. No acute osseous findings. Prior CABG. IMPRESSION: Marked worsening LEFT pleural effusion. Consider further evaluation with chest CT scan with contrast. Electronically Signed   By: Staci Righter M.D.   On: 04/24/2015 13:49   Ct Chest W Contrast  04/24/2015  CLINICAL DATA:  Shortness of breath since  October of 2016 EXAM: CT CHEST WITH CONTRAST TECHNIQUE: Multidetector CT imaging of the chest was performed during intravenous contrast administration. CONTRAST:  81m OMNIPAQUE IOHEXOL 300 MG/ML  SOLN COMPARISON:  02/08/2015 FINDINGS: Right lung again demonstrates diffuse fibrotic changes similar to that seen on the prior exam. Previously seen right-sided pleural effusion has resolved. On the left there is now a large left pleural effusion which has increased in size when compared with the prior exam. The known pleural based left apical mass  lesion has regressed in the interval. There again no left upper lobe opacity similar to that seen on the prior exam likely representing a combination of radiation change and treated tumor. This is stable from the prior exam. In the left lower lobe there is now seen a 4 cm mass lesion which was not well appreciated on the prior exam consistent with recurrent disease. It appears centrally necrotic. Multiple small mediastinal lymph nodes are identified but slightly more prominent than that seen on the prior exam. The largest of these nodes lies in the right peritracheal region. It measures 17 mm. Previously it measured 7 mm. Enlargement of the precarinal lymph node is noted as well. It measures 16 mm in short axis and previously measured 12 mm with normal fatty hilus. The fatty hilus has now been eliminated. Heavy coronary calcifications are seen. Scattered small lymph nodes are noted in the hila bilaterally. Three enhancing pleural based mass lesions are noted in the left posterior costophrenic angle. These measure approximately 2.5 cm each. A celiac axis lymph node is noted which measures 15 mm in short axis which was not well seen on the prior exam. Along the inferior aspect of the cardiac border there is a 2.6 cm soft tissue lesion identified which was not seen on the prior exam consistent with enlarging lymphadenopathy. IMPRESSION: Enlarging left-sided pleural effusion.  Additionally multiple pleural-based enhancing lesions are noted consistent with new metastatic deposits. Resolution of previously seen left upper lobe pleural-based lesion. Multiple newly enlarged mediastinal lymph nodes are noted. New pericardial lymphadenopathy as well as celiac axis lymphadenopathy is seen. Electronically Signed   By: Inez Catalina M.D.   On: 04/24/2015 16:27    I have personally reviewed images of chest xray or the CT CHEST   EKG: Atrial flutter with 2-1 block  ASSESSMENT AND PLAN: Present on Admission:  . Pleural effusion: Given history of small cell cancer of the lung and CT chest findings of multiple pleural-based enhancing lesions consistent with metastatic deposits-likely malignant in nature. PCCM consulted, will undergo ultrasound-guided thoracocentesis tomorrow morning. Recommendations are to hold anticoagulation overnight.   . Small cell carcinoma of left lung: Currently under observation-competed systemic chemotherapy December 2016, and just completed prophylactic cranial radiation under the care of radiation oncology. Unfortunately, CT chest done today shows new pleural deposits and enlarging mediastinal lymphadenopathy. We will need to notify Dr. Earlie Server of patient's admission, continue weight for pleural fluid analysis and cytology results.  . Atrial fibrillation : continue Tikosyn and Coreg. Hold anticoagulation in anticipation of thoracocentesis.   .Insulin-dependent diabetes:Continue Levemir 20 units daily, and SSI. Follow   . Rheumatoid lung disease: Seems to be stable-but could cause pleural effusion is well-however given new CT chest findings, pleural effusion is likely malignant in nature.  . Constipation: Start MiraLAX twice a day, one dose of Fleet enema today. He claims he has occasional black stools-but his hemoglobin is actually higher than his usual baseline-I doubt GI bleeding at this time. But we'll check FOBT stools.  Further plan will depend as  patient's clinical course evolves and further radiologic and laboratory data become available. Patient will be monitored closely.  Above noted plan was discussed with patient/SPOUSE/Grandson face to face at bedside, they were in agreement.   CONSULTS: Cards PCCM  DVT Prophylaxis: Eliquis  Code Status: Full Code  Disposition Plan:  Discharge back home/SNF possibly in  1-2 days,but may warrant SNF depending on clinical course  Total time spent 45 minutes.Greater than 50% of this time  was spent in counseling, explanation of diagnosis, planning of further management, and coordination of care.  [facility] Pager [phone]  If 7PM-7AM, please contact night-coverage [email or web address] Password [email or web address] 04/24/2015, 5:10 PM

## 2015-04-24 NOTE — Consult Note (Signed)
Name: Karl [name] MRN: [id number] DOB: 1935-10-14    ADMISSION DATE:  04/24/2015 CONSULTATION DATE:  04/24/2015  REFERRING MD :  Sloan Leiter  CHIEF COMPLAINT:  SOB  BRIEF PATIENT DESCRIPTION: 80 year old male with PMH of Lung Ca s/p radiation 01/2015 (followed by BQ), CHF, and AFib on pradaxa. Presented to ED 3/22 with SOB. CT demonstrated Large left pleural effusion and stable appearing necrotic mass. PCCM consulted.   SIGNIFICANT EVENTS    STUDIES:  CT chest 3/22 >    HISTORY OF PRESENT ILLNESS:  80 year old male, former smoker, with PMH as below, including ILD, RA, CHF, AF on pradaxa, DM, He was diagnosed with small cell cancer of the lung in August 2016 by bronchoscopy. Treated with carboplatin and etoposide and radiotherapy in late 2016 (last 01/2015) and prophylactic whole brain radiation. Recently he has been struggling with cough, which lasts all night with mucous production in the morning. At baseline he is fairly dyspneic, but does not require oxygen. Not SOB at baseline however. He has had 40 pound weight loss since time chemo/radation initiation. SOB and cough have also gotten worse since that time with concern for radiation pneumonitis. 3/22 he presented to [facility] ED with complaints of progressive dyspnea with orthopnea. CXR and CT chest were performed and demonstrated large L pleural effusion. PCCM consulted.   PAST MEDICAL HISTORY :   has a past medical history of Coronary artery disease; Carotid artery occlusion; Hypertensive heart disease without CHF; Carotid artery disease; Hyperlipidemia; ICD (implantable cardiac defibrillator) in place; CHF (congestive heart failure) ([place]); Anginal pain ([place]); Pacemaker; Type 2 diabetes mellitus with vascular disease ([place]) (12/11/2008); H/O hiatal hernia; Arthritis; Ischemic cardiomyopathy; Atrial fibrillation ([place]); Rheumatoid arthritis(714.0); AICD (automatic cardioverter/defibrillator) present; Allergy; Myocardial infarction ([place]) (~  1992); Antineoplastic chemotherapy induced anemia (12/24/2014); Neutropenic fever ([place]) (10/11/2014); Shortness of breath (10/24/2012); Skin cancer (2013); Lung cancer ([place]) (09/11/14); and S/P radiation therapy (01/25/15 completed).  has past surgical history that includes Carotid endarterectomy (2009); Cardiac defibrillator placement (05/2006); Eye surgery (March 2013); Eye surgery (06/08/2011); vein bypass graft,aorto-fem-pop; Inguinal hernia repair (1970's); Hemorrhoid surgery (1970's); Coronary angioplasty with stent; Cardioversion (08/2010); Insert / replace / remove pacemaker (05/2006); Coronary artery bypass graft (08/1991); Cardioversion (08/13/2011); left heart catheterization with coronary/graft angiogram (N/A, 06/16/2012); Video bronchoscopy (Bilateral, 09/11/2014); cataract surgery (Bilateral); and Cardiac catheterization (N/A, 11/26/2014). Prior to Admission medications   Medication Sig Start Date End Date Taking? Authorizing Provider  carvedilol (COREG) 3.125 MG tablet Take 6.25 mg by mouth 2 (two) times daily with a meal. Reported on 01/18/2015   Yes Historical Provider, MD  chlorpheniramine-HYDROcodone (TUSSIONEX PENNKINETIC ER) 10-8 MG/5ML SUER Take 5 mLs by mouth every 12 (twelve) hours as needed for cough. 04/12/15  Yes Juanito Doom, MD  Cyanocobalamin (VITAMIN B-12) 2500 MCG SUBL Place 2,500 mcg under the tongue daily.    Yes Historical Provider, MD  dofetilide (TIKOSYN) 250 MCG capsule Take 250 mcg by mouth 2 (two) times daily.   Yes Historical Provider, MD  ELIQUIS 5 MG TABS tablet Take 1 tablet by mouth 2 (two) times daily. 04/23/15  Yes Historical Provider, MD  insulin detemir (LEVEMIR) 100 UNIT/ML injection Inject 0.2 mLs (20 Units total) into the skin daily. 01/11/15  Yes Robbie Lis, MD  Linaclotide [facility]) 145 MCG CAPS capsule Take 145 mcg by mouth daily.   Yes Historical Provider, MD  nitroGLYCERIN (NITROSTAT) 0.4 MG SL tablet Place 0.4 mg under the tongue every 5 (five) minutes as  needed  for chest pain. Reported on 04/05/2015   Yes Historical Provider, MD  Omega-3 Fatty Acids (FISH OIL) 1000 MG CAPS Take 1,000 mg by mouth 2 (two) times daily.    Yes Historical Provider, MD  simvastatin (ZOCOR) 40 MG tablet Take 40 mg by mouth daily.    Yes Historical Provider, MD  PRADAXA 150 MG CAPS capsule TAKE 1 CAPSULE BY MOUTH TWICE DAILY Patient taking differently: TAKE 150 MG  BY MOUTH TWICE DAILY 03/06/13   Evans Lance, MD   Allergies  Allergen Reactions  . Amiodarone Other (See Comments)    Tremor   . Rosiglitazone-Metformin Other (See Comments)    Unknown    FAMILY HISTORY:  family history includes Diabetes in his father; Heart attack in his father; Heart disease in his father; Stroke in his mother. SOCIAL HISTORY:  reports that he quit smoking about 34 years ago. His smoking use included Cigarettes. He has a 26 pack-year smoking history. He quit smokeless tobacco use about 2 years ago. His smokeless tobacco use included Chew. He reports that he drinks about 1.2 oz of alcohol per week. He reports that he does not use illicit drugs.  REVIEW OF SYSTEMS:    Bolds are positive  Constitutional: weight loss, gain, night sweats, Fevers, chills, fatigue .  HEENT: headaches, Sore throat, sneezing, nasal congestion, post nasal drip, Difficulty swallowing, Tooth/dental problems, visual complaints visual changes, ear ache CV:  chest pain, radiates: ,Orthopnea, PND, swelling in lower extremities, dizziness, palpitations, syncope.  GI  heartburn, indigestion, abdominal pain, nausea, vomiting, diarrhea, change in bowel habits, loss of appetite, bloody stools.  Resp: cough, productive:gray , hemoptysis, dyspnea, chest pain, pleuritic.  Skin: rash or itching or icterus GU: dysuria, change in color of urine, urgency or frequency. flank pain, hematuria  MS: joint pain or swelling. decreased range of motion  Psych: change in mood or affect. depression or anxiety.  Neuro: difficulty with  speech, weakness, numbness, ataxia    SUBJECTIVE:   VITAL SIGNS: Temp:  [97.9 F (36.6 C)] 97.9 F (36.6 C) (03/22 1315) Pulse Rate:  [102-105] 102 (03/22 1642) Resp:  [15-24] 18 (03/22 1642) BP: (103-122)/(61-89) 118/89 mmHg (03/22 1641) SpO2:  [93 %-96 %] 95 % (03/22 1642)  PHYSICAL EXAMINATION: General:  Chronically ill appearing male Neuro:  Alert, oriented, non-focal HEENT:  [place]/AT, PERRL, no appreciable JVD Cardiovascular:  IRIR, no MRG Lungs:  Diminished left Abdomen:  Soft, non-tender, non-distended Musculoskeletal:  No acute deformity or ROM limition Skin:  Grossly intact   Recent Labs Lab 04/24/15 1322  NA 136  K 3.9  CL 101  CO2 25  BUN 9  CREATININE 0.79  GLUCOSE 176*    Recent Labs Lab 04/24/15 1322  HGB 11.0*  HCT 34.4*  WBC 6.1  PLT 168   Dg Chest 2 View  04/24/2015  CLINICAL DATA:  Shortness of breath.  Lung cancer. EXAM: CHEST  2 VIEW COMPARISON:  02/22/2015. FINDINGS: The heart is enlarged. Coronary stents are present. Single lead pacer from a LEFT subclavian approach, battery pack has been removed. The single lead remains in good position in the RIGHT ventricle. Worsening aeration with volume loss and new large LEFT pleural effusion, concerning for recurrent malignancy. Increased opacity centrally around the LEFT hilum. Chronic markings the RIGHT lung, stable. No acute osseous findings. Prior CABG. IMPRESSION: Marked worsening LEFT pleural effusion. Consider further evaluation with chest CT scan with contrast. Electronically Signed   By: Staci Righter M.D.   On: 04/24/2015 13:49  Ct Chest W Contrast  04/24/2015  CLINICAL DATA:  Shortness of breath since October of 2016 EXAM: CT CHEST WITH CONTRAST TECHNIQUE: Multidetector CT imaging of the chest was performed during intravenous contrast administration. CONTRAST:  85m OMNIPAQUE IOHEXOL 300 MG/ML  SOLN COMPARISON:  02/08/2015 FINDINGS: Right lung again demonstrates diffuse fibrotic changes similar to  that seen on the prior exam. Previously seen right-sided pleural effusion has resolved. On the left there is now a large left pleural effusion which has increased in size when compared with the prior exam. The known pleural based left apical mass lesion has regressed in the interval. There again no left upper lobe opacity similar to that seen on the prior exam likely representing a combination of radiation change and treated tumor. This is stable from the prior exam. In the left lower lobe there is now seen a 4 cm mass lesion which was not well appreciated on the prior exam consistent with recurrent disease. It appears centrally necrotic. Multiple small mediastinal lymph nodes are identified but slightly more prominent than that seen on the prior exam. The largest of these nodes lies in the right peritracheal region. It measures 17 mm. Previously it measured 7 mm. Enlargement of the precarinal lymph node is noted as well. It measures 16 mm in short axis and previously measured 12 mm with normal fatty hilus. The fatty hilus has now been eliminated. Heavy coronary calcifications are seen. Scattered small lymph nodes are noted in the hila bilaterally. Three enhancing pleural based mass lesions are noted in the left posterior costophrenic angle. These measure approximately 2.5 cm each. A celiac axis lymph node is noted which measures 15 mm in short axis which was not well seen on the prior exam. Along the inferior aspect of the cardiac border there is a 2.6 cm soft tissue lesion identified which was not seen on the prior exam consistent with enlarging lymphadenopathy. IMPRESSION: Enlarging left-sided pleural effusion. Additionally multiple pleural-based enhancing lesions are noted consistent with new metastatic deposits. Resolution of previously seen left upper lobe pleural-based lesion. Multiple newly enlarged mediastinal lymph nodes are noted. New pericardial lymphadenopathy as well as celiac axis lymphadenopathy is  seen. Electronically Signed   By: MInez CatalinaM.D.   On: 04/24/2015 16:27    ASSESSMENT / PLAN:  Acute on chronic hypoxemic respiratory failure Large L pleural effusion Metastatic vs volume overload in setting CHF Pulmonary Fibrosis in setting Rheumatoid arthritis lung disease (without acute flare) Small Cell lung Ca s/p chemo/radiation, prophylactic whole brain radiation.    Plan:  - Supplemental O2 to keep SpO2 > 90% - Will likely need thoracentises, hold Pradaxa and will evaluate L pleural space 3/23 - Assess echo, BNP - May benefit from diuresis, will defer to primary/cardiology - Defer steroids as not felt to be exacerbating fibrosis  PGeorgann Housekeeper AGACNP-BC [facility] Care[facility]ulmonology/Critical Care Pager [phone] 04/24/2015 5:35 PM  Attending Note:  80year old male with ILD and non-operative lung adeno s/p chemo and radiation who presents to the hospital with SOB that started after his diagnosis of cancer but has significantly worsened over the last two days.  Now he is at a stage where he can no longer lay in bed due to SOB.  He has had cough and sputum production since he started radiation and that has not changed.  CT was done that I reviewed myself and showed a large left sided pleural effusion (I am also concerned that this lung is entrapped) and  PCCM was consulted.  On exam very diminished on the left and crackles on the right.  Discussed with TRH-MD and PCCM-NP.  Pleural effusion: Suspect due to CHF or lung cancer.  - Hold anti-coagulation.  - Thora in AM.  - Fluid analysis.  Hypoxemic respiratory failure: Suspect patient has underlying and undiagnosed hypoxemia.  - Titrate O2 for sat of 88-92%.  - Will need an ambulatory desaturation study prior to discharge to see if will qualify for home O2.  Lung cancer:  - Thora and send fluid for cytology.  Orthopnea: concern for CHF given clinical picture.  - BNP.  - 2D echo.  - May need diureses.  PCCM  will follow.  Patient seen and examined, agree with above note.  I dictated the care and orders written for this patient under my direction.  Rush Farmer, MD [phone]

## 2015-04-24 NOTE — Consult Note (Signed)
Cardiology Consult Note  Admit date: 04/24/2015 Name: Karl [name] 80 y.o.  male DOB:  05/02/1935 MRN:  [id number]  Today's date:  04/24/2015  Primary Physician:    Dr. [name]  Reason for Consultation:    Shortness of breath  IMPRESSIONS: 1.  Severe progressive dyspnea likely due to recurrent large left pleural effusion 2.  Small cell lung cancer with recent radiation therapy and chemotherapy treatment 3.  Rheumatoid lung disease with pulmonary fibrosis 4.  History of paroxysmal atrial fibrillation with recurrent atrial flutter currently 5.  Ischemic cardiomyopathy  RECOMMENDATION: His dyspnea is likely due to his pulmonary condition with a large left pleural effusion.  He needs to have an echocardiogram this admission and likely will need to have a thoracentesis this admission to help with his symptoms of dyspnea.  May need to involve oncology to be sure he has not had recurrent cancer.  I doubt that the atrial flutter is the cause of his shortness of breath at this time.    HISTORY: This 80 year old male has a history of coronary bypass grafting and stenting.  He has known hypertensive heart disease and has a chronic ischemic cardiomyopathy.  He has a history of atrial fibrillation that was tried initially on amiodarone and later on Tikosyn and has felt well but has recently had a recurrence of atrial flutter.  He was set up to have an atrial flutter ablation by Dr. Lovena Le but he called the office today complaining of severe shortness of breath to the point where he has significant orthopnea, PND and is unable to do even mild walking or any type of activity.  He has a history of small cell lung cancer that was diagnosed.  He has undergone radiation therapy for this as well as chemotherapy as part of the treatment for that his previous defibrillator battery was removed and he now has a life vest on.  He has been short of breath for some time and had a  thoracentesis several months ago.   He does have significant PND and orthopnea but does not have any edema.  He does not have any anginal pain.  Past Medical History  Diagnosis Date  . Coronary artery disease     status post CABG by Dr. Redmond Pulling in 1993 and status post percutaneous transluminal coronary angioplasty and stenting by Dr. Wynonia Lawman in 2002 and 2006  . Carotid artery occlusion     severe left internal carotid artery stenosis, asymptomatic status post carotid endarterectomy  . Hypertensive heart disease without CHF   . Carotid artery disease   . Hyperlipidemia   . ICD (implantable cardiac defibrillator) in place   . CHF (congestive heart failure) ([place])   . Anginal pain ([place])   . Pacemaker   . Type 2 diabetes mellitus with vascular disease ([place]) 12/11/2008  . H/O hiatal hernia     "gone after my bypass"  . Arthritis     "neck, back, knees"  . Ischemic cardiomyopathy   . Atrial fibrillation ([place])   . Rheumatoid arthritis(714.0)   . AICD (automatic cardioverter/defibrillator) present   . Allergy   . Myocardial infarction ([place]) ~ 1992  . Antineoplastic chemotherapy induced anemia 12/24/2014  . Neutropenic fever ([place]) 10/11/2014  . Shortness of breath 10/24/2012    June 2014 full pulmonary function testing> ratio 86%, FEV1 2.91 L (96% predicted), no change with bronchodilator, total lung capacity 5.46 L (74% predicted) DLCO 15.09 (47% predicted) May 2014 CT chest>> there is no evidence of  a pulmonary embolism, there is centrilobular emphysema seen throughout with significant paraseptal emphysema. There is also intralobular septal thickening and groundglass throughout the lungs and periphery in a fairly diffuse pattern. There no prior studies for further evaluation. 11/2012 CT Chest > no comment if progression from prior studies, persistent ground glass and interlobular septal thickening with emphysema   . 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 Lung cancer ([place]) 09/11/14    eft upper lobe lung  . S/P radiation therapy 01/25/15  completed    left upper lung      Past Surgical History  Procedure Laterality Date  . Carotid endarterectomy  2009    left  . Cardiac defibrillator placement  05/2006    St Jude   . Eye surgery  March 2013    Cataract Left eye  . Eye surgery  06/08/2011    Cataract Right eye  . Pr vein bypass graft,aorto-fem-pop    . Inguinal hernia repair  1970's    bilaterally  . Hemorrhoid surgery  1970's  . Coronary angioplasty with stent placement      "I've got 2" (08/10/11)  . Cardioversion  08/2010  . Insert / replace / remove pacemaker  05/2006    "got a defibrillator/pacemaker" (08/10/11)  . Coronary artery bypass graft  08/1991    CABG X5  . Cardioversion  08/13/2011    Procedure: CARDIOVERSION;  Surgeon: Jacolyn Reedy, MD;  Location: [place];  Service: Cardiovascular;  Laterality: N/A;  . Left heart catheterization with coronary/graft angiogram N/A 06/16/2012    Procedure: LEFT HEART CATHETERIZATION WITH Beatrix Fetters;  Surgeon: Jacolyn Reedy, MD;  Location: [facility] CATH LAB;  Service: Cardiovascular;  Laterality: N/A;  . Video bronchoscopy Bilateral 09/11/2014    Procedure: VIDEO BRONCHOSCOPY WITH FLUORO;  Surgeon: Juanito Doom, MD;  Location: [facility];  Service: Cardiopulmonary;  Laterality: Bilateral;  . Cataract surgery Bilateral   . Ep implantable device N/A 11/26/2014    Procedure:  ICD Generator Removal /Can only;  Surgeon: Evans Lance, MD;  Location: [place] CV LAB;  Service: Cardiovascular;  Laterality: N/A;     Allergies:  is allergic to amiodarone and rosiglitazone-metformin.   Medications: Prior to Admission medications   Medication Sig Start Date End Date Taking? Authorizing Provider  carvedilol (COREG) 3.125 MG tablet Take 6.25 mg by mouth 2 (two) times daily with a meal. Reported on 01/18/2015   Yes Historical Provider, MD  chlorpheniramine-HYDROcodone (TUSSIONEX PENNKINETIC ER) 10-8 MG/5ML SUER Take 5 mLs by mouth every 12 (twelve) hours as needed for  cough. 04/12/15  Yes Juanito Doom, MD  Cyanocobalamin (VITAMIN B-12) 2500 MCG SUBL Place 2,500 mcg under the tongue daily.    Yes Historical Provider, MD  dofetilide (TIKOSYN) 250 MCG capsule Take 250 mcg by mouth 2 (two) times daily.   Yes Historical Provider, MD  ELIQUIS 5 MG TABS tablet Take 1 tablet by mouth 2 (two) times daily. 04/23/15  Yes Historical Provider, MD  insulin detemir (LEVEMIR) 100 UNIT/ML injection Inject 0.2 mLs (20 Units total) into the skin daily. 01/11/15  Yes Robbie Lis, MD  Linaclotide [facility]) 145 MCG CAPS capsule Take 145 mcg by mouth daily.   Yes Historical Provider, MD  nitroGLYCERIN (NITROSTAT) 0.4 MG SL tablet Place 0.4 mg under the tongue every 5 (five) minutes as needed for chest pain. Reported on 04/05/2015   Yes Historical Provider, MD  Omega-3 Fatty Acids (FISH OIL) 1000 MG CAPS Take 1,000  mg by mouth 2 (two) times daily.    Yes Historical Provider, MD  simvastatin (ZOCOR) 40 MG tablet Take 40 mg by mouth daily.    Yes Historical Provider, MD  PRADAXA 150 MG CAPS capsule TAKE 1 CAPSULE BY MOUTH TWICE DAILY Patient taking differently: TAKE 150 MG  BY MOUTH TWICE DAILY 03/06/13   Evans Lance, MD    Family History: Family Status  Relation Status Death Age  . Father Deceased 41  . Mother Deceased 67  . Maternal Grandmother Deceased   . Maternal Grandfather Deceased   . Paternal Grandmother Deceased   . Paternal Grandfather Deceased     Social History:   reports that he quit smoking about 34 years ago. His smoking use included Cigarettes. He has a 26 pack-year smoking history. He quit smokeless tobacco use about 2 years ago. His smokeless tobacco use included Chew. He reports that he drinks about 1.2 oz of alcohol per week. He reports that he does not use illicit drugs.   Social History   Social History Narrative    Review of Systems: Significant anorexia, he is severely weak and complains of malaise and fatigue.  Other than as noted above  remainder of the review of systems unremarkable  Physical Exam: BP 118/73 mmHg  Pulse 105  Temp(Src) 97.9 F (36.6 C) (Oral)  Resp 18  SpO2 95%  General appearance: He has a pale thin white male in no acute distress Head: Normocephalic, without obvious abnormality, atraumatic, Balding male hair pattern Neck: no adenopathy, no carotid bruit, no JVD, supple, symmetrical, trachea midline and Bilateral carotid endarterectomy scars noted Lungs: Markedly reduced breath sounds in the left lower base, clear on the right Heart: Somewhat rapid rhythm normal S1-S2 no S3 Abdomen: soft, non-tender; bowel sounds normal; no masses,  no organomegaly Rectal: deferred Extremities: No edema present, normal range of motion, no deformity Pulses: 2+ and symmetric Neurologic: Grossly normal  Labs: CBC  Recent Labs  04/24/15 1322  WBC 6.1  RBC 3.85*  HGB 11.0*  HCT 34.4*  PLT 168  MCV 89.4  MCH 28.6  MCHC 32.0  RDW 15.8*   CMP   Recent Labs  04/24/15 1322  NA 136  K 3.9  CL 101  CO2 25  GLUCOSE 176*  BUN 9  CREATININE 0.79  CALCIUM 9.1  GFRNONAA >60  GFRAA >60   BNP (last 3 results) BNP    Component Value Date/Time   BNP 619.2* 01/09/2015 1300   Radiology: Significant increase in size of left effusion, hilar mass noted, cardiomegaly, previous lead from defibrillator noted  EKG: Atrial flutter with 2-1 block, ventricular response around 100  Signed:  W. Doristine Church MD [facility]   Cardiology Consultant  04/24/2015, 2:50 PM

## 2015-04-24 NOTE — ED Notes (Signed)
Pt here for SOB that has been ongoing but getting worse. sts unable to lie flat. sts HR has been irregular. Pt wearing external defib.

## 2015-04-25 DIAGNOSIS — E43 Unspecified severe protein-calorie malnutrition: Secondary | ICD-10-CM | POA: Insufficient documentation

## 2015-04-25 DIAGNOSIS — L8990 Pressure ulcer of unspecified site, unspecified stage: Secondary | ICD-10-CM | POA: Insufficient documentation

## 2015-04-25 LAB — GLUCOSE, CAPILLARY
GLUCOSE-CAPILLARY: 107 mg/dL — AB (ref 65–99)
GLUCOSE-CAPILLARY: 147 mg/dL — AB (ref 65–99)
GLUCOSE-CAPILLARY: 227 mg/dL — AB (ref 65–99)
GLUCOSE-CAPILLARY: 146 mg/dL — AB (ref 65–99)

## 2015-04-25 LAB — CBC
HCT: 33.1 % — ABNORMAL LOW (ref 39.0–52.0)
HEMOGLOBIN: 10.2 g/dL — AB (ref 13.0–17.0)
MCH: 27.3 pg (ref 26.0–34.0)
MCHC: 30.8 g/dL (ref 30.0–36.0)
MCV: 88.5 fL (ref 78.0–100.0)
PLATELETS: 166 10*3/uL (ref 150–400)
RBC: 3.74 MIL/uL — ABNORMAL LOW (ref 4.22–5.81)
RDW: 15.7 % — ABNORMAL HIGH (ref 11.5–15.5)
WBC: 6.2 10*3/uL (ref 4.0–10.5)

## 2015-04-25 LAB — PROTEIN, TOTAL: TOTAL PROTEIN: 7.1 g/dL (ref 6.5–8.1)

## 2015-04-25 LAB — BASIC METABOLIC PANEL
Anion gap: 9 (ref 5–15)
BUN: 6 mg/dL (ref 6–20)
CALCIUM: 9.2 mg/dL (ref 8.9–10.3)
CO2: 27 mmol/L (ref 22–32)
CREATININE: 0.77 mg/dL (ref 0.61–1.24)
Chloride: 101 mmol/L (ref 101–111)
GFR calc Af Amer: >60 mL/min (ref >60)
GFR calc non Af Amer: >60 mL/min (ref >60)
GLUCOSE: 102 mg/dL — AB (ref 65–99)
Potassium: 4.5 mmol/L (ref 3.5–5.1)
Sodium: 137 mmol/L (ref 135–145)

## 2015-04-25 LAB — LACTATE DEHYDROGENASE: LDH: 164 U/L (ref 98–192)

## 2015-04-25 MED ORDER — ENSURE ENLIVE PO LIQD: 237.0000 mL | Freq: Two times a day (BID) | ORAL | Status: DC

## 2015-04-25 MED ORDER — ENSURE ENLIVE PO LIQD
237.0000 mL | Freq: Three times a day (TID) | ORAL | Status: DC
  Administered 2015-04-25 – 2015-04-26 (×3): 237 mL via ORAL

## 2015-04-25 NOTE — Consult Note (Signed)
Name: Karl [name] MRN: [id number] DOB: 1935/05/04    ADMISSION DATE:  04/24/2015 CONSULTATION DATE:  04/24/2015  REFERRING MD :  Sloan Leiter  CHIEF COMPLAINT:  SOB  BRIEF PATIENT DESCRIPTION: 80 year old male with PMH of Lung Ca s/p radiation 01/2015 (followed by [facility]), CHF, and AFib on pradaxa. Presented to ED 3/22 with SOB. CT demonstrated Large left pleural effusion and stable appearing necrotic mass. PCCM consulted.   SIGNIFICANT EVENTS    STUDIES:  CT chest 3/22 >    BRIEF  80 year old male, former smoker, with PMH as below, including ILD, RA, CHF, AF on pradaxa, DM, He was diagnosed with small cell cancer of the lung in August 2016 by bronchoscopy. Treated with carboplatin and etoposide and radiotherapy in late 2016 (last 01/2015) and prophylactic whole brain radiation. Recently he has been struggling with cough, which lasts all night with mucous production in the morning. At baseline he is fairly dyspneic, but does not require oxygen. Not SOB at baseline however. He has had 40 pound weight loss since time chemo/radation initiation. SOB and cough have also gotten worse since that time with concern for radiation pneumonitis. 3/22 he presented to [facility] ED with complaints of progressive dyspnea with orthopnea. CXR and CT chest were performed and demonstrated large L pleural effusion. PCCM consulted.   SUBJECTIVE:   04/25/15 - came out of toilet. Denies complaints. REports last dose of eliquis 10am 04/24/15  VITAL SIGNS: Temp:  [97.7 F (36.5 C)-98 F (36.7 C)] 98 F (36.7 C) (03/23 0605) Pulse Rate:  [102-105] 105 (03/23 0605) Resp:  [15-26] 20 (03/23 0605) BP: (102-127)/(61-89) 115/61 mmHg (03/23 0605) SpO2:  [92 %-96 %] 92 % (03/23 0605) Weight:  [76.25 kg (168 lb 1.6 oz)] 76.25 kg (168 lb 1.6 oz) (03/22 1814)  PHYSICAL EXAMINATION: General:  Chronically ill appearing male Neuro:  Alert, oriented, non-focal HEENT:  [place]/AT, PERRL, no appreciable JVD Cardiovascular:  IRIR,  no MRG Lungs:  Diminished left with stony dullness Abdomen:  Soft, non-tender, non-distended Musculoskeletal:  No acute deformity or ROM limition Skin:  Grossly intact   Recent Labs Lab 04/24/15 1322 04/25/15 0532  NA 136 137  K 3.9 4.5  CL 101 101  CO2 25 27  BUN 9 6  CREATININE 0.79 0.77  GLUCOSE 176* 102*    Recent Labs Lab 04/24/15 1322 04/25/15 0532  HGB 11.0* 10.2*  HCT 34.4* 33.1*  WBC 6.1 6.2  PLT 168 166   Image = personally visualized  Dg Chest 2 View  04/24/2015  CLINICAL DATA:  Shortness of breath.  Lung cancer. EXAM: CHEST  2 VIEW COMPARISON:  02/22/2015. FINDINGS: The heart is enlarged. Coronary stents are present. Single lead pacer from a LEFT subclavian approach, battery pack has been removed. The single lead remains in good position in the RIGHT ventricle. Worsening aeration with volume loss and new large LEFT pleural effusion, concerning for recurrent malignancy. Increased opacity centrally around the LEFT hilum. Chronic markings the RIGHT lung, stable. No acute osseous findings. Prior CABG. IMPRESSION: Marked worsening LEFT pleural effusion. Consider further evaluation with chest CT scan with contrast. Electronically Signed   By: Staci Righter M.D.   On: 04/24/2015 13:49   Ct Chest W Contrast  04/24/2015  CLINICAL DATA:  Shortness of breath since October of 2016 EXAM: CT CHEST WITH CONTRAST TECHNIQUE: Multidetector CT imaging of the chest was performed during intravenous contrast administration. CONTRAST:  83m OMNIPAQUE IOHEXOL 300 MG/ML  SOLN COMPARISON:  02/08/2015 FINDINGS: Right lung again demonstrates diffuse fibrotic changes similar to that seen on the prior exam. Previously seen right-sided pleural effusion has resolved. On the left there is now a large left pleural effusion which has increased in size when compared with the prior exam. The known pleural based left apical mass lesion has regressed in the interval. There again no left upper lobe opacity  similar to that seen on the prior exam likely representing a combination of radiation change and treated tumor. This is stable from the prior exam. In the left lower lobe there is now seen a 4 cm mass lesion which was not well appreciated on the prior exam consistent with recurrent disease. It appears centrally necrotic. Multiple small mediastinal lymph nodes are identified but slightly more prominent than that seen on the prior exam. The largest of these nodes lies in the right peritracheal region. It measures 17 mm. Previously it measured 7 mm. Enlargement of the precarinal lymph node is noted as well. It measures 16 mm in short axis and previously measured 12 mm with normal fatty hilus. The fatty hilus has now been eliminated. Heavy coronary calcifications are seen. Scattered small lymph nodes are noted in the hila bilaterally. Three enhancing pleural based mass lesions are noted in the left posterior costophrenic angle. These measure approximately 2.5 cm each. A celiac axis lymph node is noted which measures 15 mm in short axis which was not well seen on the prior exam. Along the inferior aspect of the cardiac border there is a 2.6 cm soft tissue lesion identified which was not seen on the prior exam consistent with enlarging lymphadenopathy. IMPRESSION: Enlarging left-sided pleural effusion. Additionally multiple pleural-based enhancing lesions are noted consistent with new metastatic deposits. Resolution of previously seen left upper lobe pleural-based lesion. Multiple newly enlarged mediastinal lymph nodes are noted. New pericardial lymphadenopathy as well as celiac axis lymphadenopathy is seen. Electronically Signed   By: Inez Catalina M.D.   On: 04/24/2015 16:27    ASSESSMENT / PLAN:  Acute on chronic hypoxemic respiratory failure Large L pleural effusion Metastatic vs volume overload in setting CHF Pulmonary Fibrosis in setting Rheumatoid arthritis lung disease (without acute flare) Small Cell lung  Ca s/p chemo/radiation, prophylactic whole brain radiation.    Plan:  - Supplemental O2 to keep SpO2 > 90% - Now 24h off NOAC - IR guided thora ordered 04/25/2015 -Risks of pneumothorax, hemothorax, sedation/anesthesia complications such as cardiac or respiratory arrest or hypotension, stroke and bleeding all explained. Benefits of diagnosis but limitations of non-diagnosis also explained. Patient verbalized understanding and wished to proceed.     Dr. Brand Males, M.D., [facility]C.P Pulmonary and Critical Care Medicine Staff Physician [facility] Pager: [phone], If no answer or between  15:00h - 7:00h: call [phone]  04/25/2015 12:03 PM

## 2015-04-25 NOTE — Progress Notes (Signed)
Initial Nutrition Assessment  DOCUMENTATION CODES:   Severe malnutrition in context of chronic illness  INTERVENTION:   -Ensure Enlive po TID, each supplement provides 350 kcal and 20 grams of protein  NUTRITION DIAGNOSIS:   Malnutrition related to chronic illness as evidenced by severe depletion of body fat, severe depletion of muscle mass, percent weight loss.  GOAL:   Patient will meet greater than or equal to 90% of their needs  MONITOR:   Supplement acceptance, PO intake, Labs, Weight trends, TF tolerance, I & O's  REASON FOR ASSESSMENT:   Consult, Malnutrition Screening Tool Assessment of nutrition requirement/status  ASSESSMENT:   Karl [name] is a 80 y.o. male with a Past Medical History of small cell cancer of the lung status post recent for lactic radiation therapy to his brain-status post chemotherapy-currently on observation, atrial fibrillation on anticoagulation who presented to the ED with 2-3 weeks of shortness of breath. Shortness of breath is mostly exertional, he is comfortable at rest. There is no leg edema. Per patient, walking a few feet makes him short of breath, he does have 2-3 pillow orthopnea but denies PND. He denies any recent history of fever, cough or hemoptysis.   Pt admitted with pleural effusion.   Per MD notes, CT chest findings large and mediastinal lymphadenopathy, and pleural based enhancing lesions-hydrated suspicion for recurrence of cancer.   Spoke with pt at bedside. He reports a general decline in health over the past month as a result of chemotherapy and radiation treatments. He reports poor appetite secondary to taste changes and early satiety. PTA pt wound consume a nutri-grain bar and cup of coffee for breakfast and consume multiple small snacks such as ice cream throughout the day. Pt reports he started consuming 2-3 bottles of Ensure daily 3-4 weeks PTA. Intake continues to be poor; pt consumed a few bites of chicken salad at  dinner last night and a bite of a sausage patty at breakfast this AM.   Pt reveals his UBW is around 210#. He estimates he has lost about 45# over the past 6 months. Wt hx confirms a 31# (15.6%) wt loss over the past 6 months.  Nutrition-Focused physical exam completed. Findings are moderate to severe fat depletion, moderate to severe muscle depletion, and no edema. Prior to 6 months ago, pt was very active, participating in hunting and gardening.   Discussed importance of good meal and supplement intake to promote healing. Pt amenable to continue Ensure supplements and encouraged continued use at home. Additionally, discussed ways to add more protein in diet by adding high protein snacks and adding cold foods with less odors to assist with nausea with eating.  Case discussed with RN. She confirms presence of stage II pressure injury on sacrum.   Labs reviewed.  Diet Order:  Diet Heart Room service appropriate?: Yes; Fluid consistency:: Thin  Skin:  Wound (see comment) (stage II sacrum)  Last BM:  04/24/15  Height:   Ht Readings from Last 1 Encounters:  04/24/15 '5\' 10"'$  (1.778 m)    Weight:   Wt Readings from Last 1 Encounters:  04/24/15 168 lb 1.6 oz (76.25 kg)    Ideal Body Weight:  75.5 kg  BMI:  Body mass index is 24.12 kg/(m^2).  Estimated Nutritional Needs:   Kcal:  2000-2200  Protein:  110-125 grams  Fluid:  2.2-2.0 L  EDUCATION NEEDS:   Education needs addressed  Donae Kueker A. Jimmye Norman, RD, LDN, CDE Pager: [phone] After hours Pager: [phone]

## 2015-04-25 NOTE — Progress Notes (Signed)
PATIENT DETAILS Name: Karl [name] Age: 80 y.o. Sex: male Date of Birth: [birth year] Admit Date: 04/24/2015 Admitting Physician Evalee Mutton Kristeen Mans, MD BOF:BPZWC,HENI M, MD  Subjective: Still short of breath-mostly on ambulation.  Assessment/Plan: Principal Problem: Pleural effusion: Given history of small cell cancer of the lung and CT chest findings of multiple pleural-based enhancing lesions consistent with metastatic deposits-likely malignant in nature. PCCM consulted-thoracocentesis today. Await pleural studies and cytology. Case discussed with Dr. Julien Nordmann over the phone-he advises outpatient follow-up with him, does not think patient has any inpatient oncology needs.  Active Problems: Small cell carcinoma of left lung: competed systemic chemotherapy December 2016, and just completed prophylactic cranial radiation -was under observation-given  CT chest findings large and mediastinal lymphadenopathy, and pleural based enhancing lesions-hydrated suspicion for recurrence of cancer.Case discussed with Dr. Julien Nordmann over the phone-he advises outpatient follow-up with him, does not think patient has any inpatient oncology needs.  Atrial fibrillation : continue Tikosyn and Coreg. Holding anticoagulation in anticipation of thoracocentesis. Echo pending  Insulin-dependent diabetes: CBGs stable-Continue Levemir 20 units daily, and SSI. Follow   Constipation: Refused enema, continue MiraLAX. Claims he has had intermittent black stools, given stable hemoglobin over the past few weeks, doubt any significant GI bleeding. Await stool FOBT, but suspect we could continue anticoagulation cautiously.  Disposition: Remain inpatient-suspect home 3/24  Antimicrobial agents  See below  Anti-infectives    None      DVT Prophylaxis: SCD's-resume anticoagulation once thoracocentesis complete  Code Status: Full code   Family Communication Daughter at  bedside  Procedures: None  CONSULTS:  pulmonary/intensive care  Time spent 30 minutes-Greater than 50% of this time was spent in counseling, explanation of diagnosis, planning of further management, and coordination of care.  MEDICATIONS: Scheduled Meds: . carvedilol  6.25 mg Oral BID WC  . dofetilide  250 mcg Oral BID  . insulin aspart  0-9 Units Subcutaneous TID WC  . insulin detemir  20 Units Subcutaneous Daily  . Linaclotide  145 mcg Oral Daily  . omega-3 acid ethyl esters  1 g Oral BID  . polyethylene glycol  17 g Oral BID  . simvastatin  40 mg Oral Daily  . sodium chloride flush  3 mL Intravenous Q12H  . sodium phosphate  1 enema Rectal Once  . vitamin B-12  1,000 mcg Oral Daily   Continuous Infusions:  PRN Meds:.sodium chloride, acetaminophen **OR** acetaminophen, albuterol, chlorpheniramine-HYDROcodone, morphine injection, nitroGLYCERIN, ondansetron **OR** ondansetron (ZOFRAN) IV, oxyCODONE, polyethylene glycol, sodium chloride flush    PHYSICAL EXAM: Vital signs in last 24 hours: Filed Vitals:   04/24/15 1755 04/24/15 1814 04/24/15 2138 04/25/15 0605  BP:  127/72 102/63 115/61  Pulse: 103 104 102 105  Temp:  97.7 F (36.5 C) 97.8 F (36.6 C) 98 F (36.7 C)  TempSrc:  Oral Oral Oral  Resp: '26 20 20 20  '$ Height:  '5\' 10"'$  (1.778 m)    Weight:  76.25 kg (168 lb 1.6 oz)    SpO2: 93% 95% 94% 92%    Weight change:  Filed Weights   04/24/15 1814  Weight: 76.25 kg (168 lb 1.6 oz)   Body mass index is 24.12 kg/(m^2).   Gen Exam: Awake and alert with clear speech.   Neck: Supple, No JVD.   Chest: Decreased air entry at left base to left mid lung, otherwise clear to auscultation. CVS: S1 S2 Regular, no murmurs.  Abdomen:  soft, BS +, non tender, non distended.  Extremities: no edema, lower extremities warm to touch. Neurologic: Non Focal.   Skin: No Rash.   Wounds: N/A.    Intake/Output from previous day:  Intake/Output Summary (Last 24 hours) at 04/25/15  1136 Last data filed at 04/25/15 9242  Gross per 24 hour  Intake    120 ml  Output    400 ml  Net   -280 ml     LAB RESULTS: CBC  Recent Labs Lab 04/24/15 1322 04/25/15 0532  WBC 6.1 6.2  HGB 11.0* 10.2*  HCT 34.4* 33.1*  PLT 168 166  MCV 89.4 88.5  MCH 28.6 27.3  MCHC 32.0 30.8  RDW 15.8* 15.7*    Chemistries   Recent Labs Lab 04/24/15 1322 04/25/15 0532  NA 136 137  K 3.9 4.5  CL 101 101  CO2 25 27  GLUCOSE 176* 102*  BUN 9 6  CREATININE 0.79 0.77  CALCIUM 9.1 9.2    CBG:  Recent Labs Lab 04/24/15 2342 04/25/15 0846  GLUCAP 128* 107*    GFR Estimated Creatinine Clearance: 79.8 mL/min (by C-G formula based on Cr of 0.77).  Coagulation profile No results for input(s): INR, PROTIME in the last 168 hours.  Cardiac Enzymes No results for input(s): CKMB, TROPONINI, MYOGLOBIN in the last 168 hours.  Invalid input(s): CK  Invalid input(s): POCBNP No results for input(s): DDIMER in the last 72 hours. No results for input(s): HGBA1C in the last 72 hours. No results for input(s): CHOL, HDL, LDLCALC, TRIG, CHOLHDL, LDLDIRECT in the last 72 hours. No results for input(s): TSH, T4TOTAL, T3FREE, THYROIDAB in the last 72 hours.  Invalid input(s): FREET3 No results for input(s): VITAMINB12, FOLATE, FERRITIN, TIBC, IRON, RETICCTPCT in the last 72 hours. No results for input(s): LIPASE, AMYLASE in the last 72 hours.  Urine Studies No results for input(s): UHGB, CRYS in the last 72 hours.  Invalid input(s): UACOL, UAPR, USPG, UPH, UTP, UGL, UKET, UBIL, UNIT, UROB, ULEU, UEPI, UWBC, URBC, UBAC, CAST, UCOM, BILUA  MICROBIOLOGY: No results found for this or any previous visit (from the past 240 hour(s)).  RADIOLOGY STUDIES/RESULTS: Dg Chest 2 View  04/24/2015  CLINICAL DATA:  Shortness of breath.  Lung cancer. EXAM: CHEST  2 VIEW COMPARISON:  02/22/2015. FINDINGS: The heart is enlarged. Coronary stents are present. Single lead pacer from a LEFT subclavian  approach, battery pack has been removed. The single lead remains in good position in the RIGHT ventricle. Worsening aeration with volume loss and new large LEFT pleural effusion, concerning for recurrent malignancy. Increased opacity centrally around the LEFT hilum. Chronic markings the RIGHT lung, stable. No acute osseous findings. Prior CABG. IMPRESSION: Marked worsening LEFT pleural effusion. Consider further evaluation with chest CT scan with contrast. Electronically Signed   By: Staci Righter M.D.   On: 04/24/2015 13:49   Ct Chest W Contrast  04/24/2015  CLINICAL DATA:  Shortness of breath since October of 2016 EXAM: CT CHEST WITH CONTRAST TECHNIQUE: Multidetector CT imaging of the chest was performed during intravenous contrast administration. CONTRAST:  53m OMNIPAQUE IOHEXOL 300 MG/ML  SOLN COMPARISON:  02/08/2015 FINDINGS: Right lung again demonstrates diffuse fibrotic changes similar to that seen on the prior exam. Previously seen right-sided pleural effusion has resolved. On the left there is now a large left pleural effusion which has increased in size when compared with the prior exam. The known pleural based left apical mass lesion has regressed in the interval. There again  no left upper lobe opacity similar to that seen on the prior exam likely representing a combination of radiation change and treated tumor. This is stable from the prior exam. In the left lower lobe there is now seen a 4 cm mass lesion which was not well appreciated on the prior exam consistent with recurrent disease. It appears centrally necrotic. Multiple small mediastinal lymph nodes are identified but slightly more prominent than that seen on the prior exam. The largest of these nodes lies in the right peritracheal region. It measures 17 mm. Previously it measured 7 mm. Enlargement of the precarinal lymph node is noted as well. It measures 16 mm in short axis and previously measured 12 mm with normal fatty hilus. The fatty  hilus has now been eliminated. Heavy coronary calcifications are seen. Scattered small lymph nodes are noted in the hila bilaterally. Three enhancing pleural based mass lesions are noted in the left posterior costophrenic angle. These measure approximately 2.5 cm each. A celiac axis lymph node is noted which measures 15 mm in short axis which was not well seen on the prior exam. Along the inferior aspect of the cardiac border there is a 2.6 cm soft tissue lesion identified which was not seen on the prior exam consistent with enlarging lymphadenopathy. IMPRESSION: Enlarging left-sided pleural effusion. Additionally multiple pleural-based enhancing lesions are noted consistent with new metastatic deposits. Resolution of previously seen left upper lobe pleural-based lesion. Multiple newly enlarged mediastinal lymph nodes are noted. New pericardial lymphadenopathy as well as celiac axis lymphadenopathy is seen. Electronically Signed   By: Inez Catalina M.D.   On: 04/24/2015 16:27    Oren Binet, MD  [facility] Pager:[phone]  If 7PM-7AM, please contact night-coverage [email or web address] Password [facility] 04/25/2015, 11:36 AM   LOS: 1 day

## 2015-04-25 NOTE — Care Management Note (Signed)
Case Management Note  Patient Details  Name: Karl [name] MRN: [id number] Date of Birth: [birth year]  Subjective/Objective:  80 y.o. M admitted 04/24/2015 with Large L pleural effusion. Hs Lung Ca with Mets. S/P Radiation 2016. Thoracentesis planned for 04/25/2015.                   Action/Plan: Anticipate discharge home Friday 04/26/2015.  No further CM needs but will be available should additional discharge needs arise.   Expected Discharge Date:                  Expected Discharge Plan:     In-House Referral:     Discharge planning Services     Post Acute Care Choice:    Choice offered to:     DME Arranged:    DME Agency:     HH Arranged:    [place] Agency:     Status of Service:     Medicare Important Message Given:    Date Medicare IM Given:    Medicare IM give by:    Date Additional Medicare IM Given:    Additional Medicare Important Message give by:     If discussed at [place] of Stay Meetings, dates discussed:    Additional Comments:  Delrae Sawyers, RN 04/25/2015, 3:11 PM

## 2015-04-26 ENCOUNTER — Encounter (HOSPITAL_COMMUNITY): Payer: Self-pay | Admitting: Radiology

## 2015-04-26 ENCOUNTER — Telehealth: Payer: Self-pay | Admitting: Internal Medicine

## 2015-04-26 ENCOUNTER — Inpatient Hospital Stay (HOSPITAL_COMMUNITY): Payer: Medicare Other

## 2015-04-26 ENCOUNTER — Other Ambulatory Visit (HOSPITAL_COMMUNITY): Payer: Medicare Other

## 2015-04-26 DIAGNOSIS — K5900 Constipation, unspecified: Secondary | ICD-10-CM

## 2015-04-26 LAB — GRAM STAIN

## 2015-04-26 LAB — PROTEIN, BODY FLUID: TOTAL PROTEIN, FLUID: 4.2 g/dL

## 2015-04-26 LAB — BODY FLUID CELL COUNT WITH DIFFERENTIAL
LYMPHS FL: 86 %
MONOCYTE-MACROPHAGE-SEROUS FLUID: 11 % — AB (ref 50–90)
NEUTROPHIL FLUID: 3 % (ref 0–25)
WBC FLUID: 370 uL (ref 0–1000)

## 2015-04-26 LAB — LACTATE DEHYDROGENASE, PLEURAL OR PERITONEAL FLUID: LD FL: 172 U/L — AB (ref 3–23)

## 2015-04-26 LAB — GLUCOSE, CAPILLARY
GLUCOSE-CAPILLARY: 94 mg/dL (ref 65–99)
GLUCOSE-CAPILLARY: 119 mg/dL — AB (ref 65–99)
GLUCOSE-CAPILLARY: 105 mg/dL — AB (ref 65–99)

## 2015-04-26 LAB — GLUCOSE, SEROUS FLUID: Glucose, Fluid: 102 mg/dL

## 2015-04-26 MED ORDER — POTASSIUM CHLORIDE ER 10 MEQ PO TBCR: 10.0000 meq | EXTENDED_RELEASE_TABLET | Freq: Every day | ORAL | Status: AC

## 2015-04-26 MED ORDER — LIDOCAINE HCL (PF) 1 % IJ SOLN
INTRAMUSCULAR | Status: AC
  Filled 2015-04-26: qty 10

## 2015-04-26 MED ORDER — ENSURE ENLIVE PO LIQD: 237.0000 mL | Freq: Three times a day (TID) | ORAL | Status: AC

## 2015-04-26 MED ORDER — FUROSEMIDE 20 MG PO TABS: 20.0000 mg | ORAL_TABLET | Freq: Every day | ORAL | Status: AC

## 2015-04-26 MED ORDER — POLYETHYLENE GLYCOL 3350 17 G PO PACK: 17.0000 g | PACK | Freq: Two times a day (BID) | ORAL | Status: AC

## 2015-04-26 NOTE — Procedures (Signed)
Request for (L)thoracentesis of up to 1.5L max. Successful US guided left thoracentesis. Yielded 1.5L of hazy, blood tinged fluid. Pt tolerated procedure well. No immediate complications.  Specimen was sent for labs. CXR ordered.  Ascencion Dike PA-C 04/26/2015 10:08 AM

## 2015-04-26 NOTE — Telephone Encounter (Signed)
[facility] called to sched appt...done....they will notify pt

## 2015-04-26 NOTE — Progress Notes (Signed)
NURSING PROGRESS NOTE  Karl [name] [id number] Discharge Data: 04/26/2015 2:59 PM Attending Provider: Jonetta Osgood, MD JSH:[id number],ZCHY Jerilynn Mages, MD     Wadie Lessen to be D/C'd Home per Ghimire,MD order.  Discussed with the patient and wife the After Visit Summary and all questions fully answered. All IV's discontinued with no bleeding noted. All belongings returned to patient for patient to take home. Pt given prescriptions for new medications and has no further questions. Pt to taken downstairs via wheelchair.   Last Vital Signs:  Blood pressure 104/65, pulse 105, temperature 97.9 F (36.6 C), temperature source Oral, resp. rate 18, height '5\' 10"'$  (1.778 m), weight 76.25 kg (168 lb 1.6 oz), SpO2 92 %.  Discharge Medication List   Medication List    TAKE these medications        carvedilol 3.125 MG tablet  Commonly known as:  COREG  Take 6.25 mg by mouth 2 (two) times daily with a meal. Reported on 01/18/2015     chlorpheniramine-HYDROcodone 10-8 MG/5ML Suer  Commonly known as:  TUSSIONEX PENNKINETIC ER  Take 5 mLs by mouth every 12 (twelve) hours as needed for cough.     dofetilide 250 MCG capsule  Commonly known as:  TIKOSYN  Take 250 mcg by mouth 2 (two) times daily.     ELIQUIS 5 MG Tabs tablet  Generic drug:  apixaban  Take 1 tablet by mouth 2 (two) times daily.     feeding supplement (ENSURE ENLIVE) Liqd  Take 237 mLs by mouth 3 (three) times daily between meals.     Fish Oil 1000 MG Caps  Take 1,000 mg by mouth 2 (two) times daily.     furosemide 20 MG tablet  Commonly known as:  LASIX  Take 1 tablet (20 mg total) by mouth daily.     insulin detemir 100 UNIT/ML injection  Commonly known as:  LEVEMIR  Inject 0.2 mLs (20 Units total) into the skin daily.     LINZESS 145 MCG Caps capsule  Generic drug:  Linaclotide  Take 145 mcg by mouth daily.     nitroGLYCERIN 0.4 MG SL tablet  Commonly known as:  NITROSTAT  Place 0.4 mg under the tongue every 5 (five)  minutes as needed for chest pain. Reported on 04/05/2015     polyethylene glycol packet  Commonly known as:  MIRALAX / GLYCOLAX  Take 17 g by mouth 2 (two) times daily.     potassium chloride 10 MEQ tablet  Commonly known as:  K-DUR  Take 1 tablet (10 mEq total) by mouth daily.     simvastatin 40 MG tablet  Commonly known as:  ZOCOR  Take 40 mg by mouth daily.     Vitamin B-12 2500 MCG Subl  Place 2,500 mcg under the tongue daily.

## 2015-04-26 NOTE — Progress Notes (Signed)
Name: Karl [name] MRN: [id number] DOB: 13-Apr-1935    ADMISSION DATE:  04/24/2015 CONSULTATION DATE:  04/24/2015  REFERRING MD :  Sloan Leiter  CHIEF COMPLAINT:  SOB  BRIEF PATIENT DESCRIPTION: 80 year old male with PMH of Lung Ca s/p radiation 01/2015 (followed by BQ), CHF, and AFib on pradaxa. Presented to ED 3/22 with SOB. CT demonstrated Large left pleural effusion and stable appearing necrotic mass. PCCM consulted.   SIGNIFICANT EVENTS    STUDIES:  CT chest 3/22 >    BRIEF  80 year old male, former smoker, with PMH as below, including ILD, RA, CHF, AF on pradaxa, DM, He was diagnosed with small cell cancer of the lung in August 2016 by bronchoscopy. Treated with carboplatin and etoposide and radiotherapy in late 2016 (last 01/2015) and prophylactic whole brain radiation. Recently he has been struggling with cough, which lasts all night with mucous production in the morning. At baseline he is fairly dyspneic, but does not require oxygen. Not SOB at baseline however. He has had 40 pound weight loss since time chemo/radation initiation. SOB and cough have also gotten worse since that time with concern for radiation pneumonitis. 3/22 he presented to [facility] ED with complaints of progressive dyspnea with orthopnea. CXR and CT chest were performed and demonstrated large L pleural effusion. PCCM consulted.   SUBJECTIVE:   Underwent thoracentesis Denies pain or dyspnea-infact breathing is much improved Afebrile  VITAL SIGNS: Temp:  [97.7 F (36.5 C)-98.4 F (36.9 C)] 97.9 F (36.6 C) (03/24 1331) Pulse Rate:  [60-106] 105 (03/24 1331) Resp:  [18] 18 (03/24 1331) BP: (104-124)/(43-72) 104/65 mmHg (03/24 1331) SpO2:  [92 %-96 %] 92 % (03/24 1331)  PHYSICAL EXAMINATION: General:  Chronically ill appearing male Neuro:  Alert, oriented, non-focal HEENT:  [place]/AT, PERRL, no appreciable JVD Cardiovascular:  IRIR, no MRG Lungs:  Diminished left with  dullness Abdomen:  Soft,  non-tender, non-distended Musculoskeletal:  No acute deformity or ROM limition Skin:  Grossly intact   Recent Labs Lab 04/24/15 1322 04/25/15 0532  NA 136 137  K 3.9 4.5  CL 101 101  CO2 25 27  BUN 9 6  CREATININE 0.79 0.77  GLUCOSE 176* 102*    Recent Labs Lab 04/24/15 1322 04/25/15 0532  HGB 11.0* 10.2*  HCT 34.4* 33.1*  WBC 6.1 6.2  PLT 168 166   Image = personally visualized  Dg Chest 1 View  04/26/2015  CLINICAL DATA:  Status post left-sided thoracentesis EXAM: CHEST 1 VIEW COMPARISON:  Chest radiograph and chest CT April 24, 2015 FINDINGS: No pneumothorax. Left effusion is smaller post thoracentesis. There is generalized interstitial and patchy alveolar edema, stable. There is a small residual left effusion with a mild loculated component laterally. There is cardiomegaly with pulmonary venous hypertension. Pacemaker lead remains attached to the right ventricle. Patient is status post coronary artery bypass grafting. Bones are osteoporotic. IMPRESSION: No demonstrable pneumothorax. Persistent congestive heart failure. The degree of interstitial and patchy alveolar edema remain stable. There is stable cardiomegaly with pulmonary venous hypertension. Electronically Signed   By: Lowella Grip III M.D.   On: 04/26/2015 10:19   US Thoracentesis Asp Pleural Space W/img Guide  04/26/2015  INDICATION: Lung cancer. Recurrent left pleural effusion. Request for diagnostic and therapeutic thoracentesis of up to 1.5 L max. EXAM: ULTRASOUND GUIDED LEFT THORACENTESIS MEDICATIONS: None. COMPLICATIONS: None immediate. PROCEDURE: An ultrasound guided thoracentesis was thoroughly discussed with the patient and questions answered. The benefits, risks, alternatives and complications were  also discussed. The patient understands and wishes to proceed with the procedure. Written consent was obtained. Ultrasound was performed to localize and mark an adequate pocket of fluid in the left chest. The  area was then prepped and draped in the normal sterile fashion. 1% Lidocaine was used for local anesthesia. Under ultrasound guidance a Safe-T-Centesis catheter was introduced. Thoracentesis was performed. The catheter was removed and a dressing applied. FINDINGS: A total of approximately 1.5 L of hazy, blood-tinged fluid was removed. Samples were sent to the laboratory as requested by the clinical team. IMPRESSION: Successful ultrasound guided left thoracentesis yielding 1.5 L of pleural fluid. Read by: Ascencion Dike PA-C Electronically Signed   By: Sandi Mariscal M.D.   On: 04/26/2015 10:25    ASSESSMENT / PLAN:  Acute on chronic hypoxemic respiratory failure Large L pleural effusion Metastatic vs volume overload in setting CHF Pulmonary Fibrosis in setting Rheumatoid arthritis lung disease (without acute flare) Small Cell lung Ca s/p chemo/radiation, prophylactic whole brain radiation.    Plan:  - Supplemental O2 to keep SpO2 > 90% -Pleural effusion appears to be a lymphocytic exudate, await cytology. Of note pleural fluid cytology was negative in 02/2015, but very likely this is a malignant effusion He will follow-up as an outpatient with Dr. Lake Bells and Dr. Roselee Nova MD. [facility]. Mobile Pulmonary & Critical care Pager [phone] If no response call [phone]   04/26/2015     04/26/2015 4:28 PM

## 2015-04-26 NOTE — Discharge Summary (Addendum)
PATIENT DETAILS Name: Karl [name] Age: 80 y.o. Sex: male Date of Birth: 01/06/1936 MRN: [id number]. Admitting Physician: Jonetta Osgood, MD YIR:SWNIO,EVOJ M, MD  Admit Date: 04/24/2015 Discharge date: 04/26/2015  Recommendations for Outpatient Follow-up:  1. Pleural fluid cytology is pending-please follow 2. 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 Coronary artery disease     status post CABG by Dr. Redmond Pulling in 1993 and status post percutaneous transluminal coronary angioplasty and stenting by Dr. Wynonia Lawman in 2002 and 2006  . Carotid artery occlusion     severe left internal carotid artery stenosis, asymptomatic status post carotid endarterectomy  . Hypertensive heart disease without CHF   . Carotid artery disease   . Hyperlipidemia   . ICD (implantable cardiac defibrillator) in place   . CHF (congestive heart failure) ([place])   . Anginal pain ([place])   . Pacemaker   . Type 2 diabetes mellitus with vascular disease ([place]) 12/11/2008  . H/O hiatal hernia     "gone after my bypass"  . Arthritis     "neck, back, knees"  . Ischemic cardiomyopathy   . Atrial fibrillation ([place])   . Rheumatoid arthritis(714.0)   . AICD (automatic cardioverter/defibrillator) present   . Allergy   . Myocardial infarction ([place]) ~ 1992  . Antineoplastic chemotherapy induced anemia 12/24/2014  . Neutropenic fever ([place]) 10/11/2014  . Shortness of breath 10/24/2012    June 2014 full pulmonary function testing> ratio 86%, FEV1 2.91 L (96% predicted), no change with bronchodilator, total lung capacity 5.46 L (74% predicted) DLCO 15.09 (47% predicted) May 2014 CT chest>> there is no  evidence of a pulmonary embolism, there is centrilobular emphysema seen throughout with significant paraseptal emphysema. There is also intralobular septal thickening and groundglass throughout the lungs and periphery in a fairly diffuse pattern. There no prior studies for further evaluation. 11/2012 CT Chest > no comment if progression from prior studies, persistent ground glass and interlobular septal thickening with emphysema   . 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 Lung cancer ([place]) 09/11/14    left upper lobe lung  . S/P radiation therapy 01/25/15 completed    left upper lung    DISCHARGE MEDICATIONS: Current Discharge Medication List    START taking these medications   Details  feeding supplement, ENSURE ENLIVE, (ENSURE ENLIVE) LIQD Take 237 mLs by mouth 3 (three) times daily between meals. Qty: 90 Bottle, Refills: 12    furosemide (LASIX) 20 MG tablet Take 1 tablet (20 mg total) by mouth daily. Qty: 30 tablet, Refills: 0    polyethylene glycol (MIRALAX / GLYCOLAX) packet Take 17 g by mouth 2 (two) times daily. Qty: 14 each, Refills: 0    potassium chloride (K-DUR) 10 MEQ tablet Take 1 tablet (10 mEq total) by mouth daily. Qty: 30 tablet, Refills: 0      CONTINUE these medications which have NOT CHANGED   Details  carvedilol (COREG) 3.125 MG tablet Take 6.25 mg by mouth 2 (two) times daily with a meal. Reported on 01/18/2015    chlorpheniramine-HYDROcodone (TUSSIONEX PENNKINETIC ER) 10-8 MG/5ML SUER Take 5 mLs by mouth every 12 (twelve) hours as  needed for cough. Qty: 140 mL, Refills: 0    Cyanocobalamin (VITAMIN B-12) 2500 MCG SUBL Place 2,500 mcg under the tongue daily.     dofetilide (TIKOSYN) 250 MCG capsule Take 250 mcg by mouth 2 (two) times daily.    ELIQUIS 5 MG TABS tablet Take 1 tablet by mouth 2 (two) times daily.    insulin detemir (LEVEMIR) 100 UNIT/ML injection Inject 0.2 mLs (20 Units total) into the skin daily. Qty: 10 mL, Refills: 11    Linaclotide  (LINZESS) 145 MCG CAPS capsule Take 145 mcg by mouth daily.    nitroGLYCERIN (NITROSTAT) 0.4 MG SL tablet Place 0.4 mg under the tongue every 5 (five) minutes as needed for chest pain. Reported on 04/05/2015    Omega-3 Fatty Acids (FISH OIL) 1000 MG CAPS Take 1,000 mg by mouth 2 (two) times daily.     simvastatin (ZOCOR) 40 MG tablet Take 40 mg by mouth daily.         ALLERGIES:   Allergies  Allergen Reactions  . Amiodarone Other (See Comments)    Tremor   . Rosiglitazone-Metformin Other (See Comments)    Unknown    BRIEF HPI:  See H&P, Labs, Consult and Test reports for all details in brief, patient was admitted for shortness of breath-found to have a large pleural effusion.Admitted for further evaluation and treatment  CONSULTATIONS:   cardiology and pulmonary/intensive care  PERTINENT RADIOLOGIC STUDIES: Dg Chest 1 View  04/26/2015  CLINICAL DATA:  Status post left-sided thoracentesis EXAM: CHEST 1 VIEW COMPARISON:  Chest radiograph and chest CT April 24, 2015 FINDINGS: No pneumothorax. Left effusion is smaller post thoracentesis. There is generalized interstitial and patchy alveolar edema, stable. There is a small residual left effusion with a mild loculated component laterally. There is cardiomegaly with pulmonary venous hypertension. Pacemaker lead remains attached to the right ventricle. Patient is status post coronary artery bypass grafting. Bones are osteoporotic. IMPRESSION: No demonstrable pneumothorax. Persistent congestive heart failure. The degree of interstitial and patchy alveolar edema remain stable. There is stable cardiomegaly with pulmonary venous hypertension. Electronically Signed   By: Lowella Grip III M.D.   On: 04/26/2015 10:19   Dg Chest 2 View  04/24/2015  CLINICAL DATA:  Shortness of breath.  Lung cancer. EXAM: CHEST  2 VIEW COMPARISON:  02/22/2015. FINDINGS: The heart is enlarged. Coronary stents are present. Single lead pacer from a LEFT subclavian  approach, battery pack has been removed. The single lead remains in good position in the RIGHT ventricle. Worsening aeration with volume loss and new large LEFT pleural effusion, concerning for recurrent malignancy. Increased opacity centrally around the LEFT hilum. Chronic markings the RIGHT lung, stable. No acute osseous findings. Prior CABG. IMPRESSION: Marked worsening LEFT pleural effusion. Consider further evaluation with chest CT scan with contrast. Electronically Signed   By: Staci Righter M.D.   On: 04/24/2015 13:49   Ct Chest W Contrast  04/24/2015  CLINICAL DATA:  Shortness of breath since October of 2016 EXAM: CT CHEST WITH CONTRAST TECHNIQUE: Multidetector CT imaging of the chest was performed during intravenous contrast administration. CONTRAST:  1m OMNIPAQUE IOHEXOL 300 MG/ML  SOLN COMPARISON:  02/08/2015 FINDINGS: Right lung again demonstrates diffuse fibrotic changes similar to that seen on the prior exam. Previously seen right-sided pleural effusion has resolved. On the left there is now a large left pleural effusion which has increased in size when compared with the prior exam. The known pleural based left apical mass lesion has regressed in the  interval. There again no left upper lobe opacity similar to that seen on the prior exam likely representing a combination of radiation change and treated tumor. This is stable from the prior exam. In the left lower lobe there is now seen a 4 cm mass lesion which was not well appreciated on the prior exam consistent with recurrent disease. It appears centrally necrotic. Multiple small mediastinal lymph nodes are identified but slightly more prominent than that seen on the prior exam. The largest of these nodes lies in the right peritracheal region. It measures 17 mm. Previously it measured 7 mm. Enlargement of the precarinal lymph node is noted as well. It measures 16 mm in short axis and previously measured 12 mm with normal fatty hilus. The fatty  hilus has now been eliminated. Heavy coronary calcifications are seen. Scattered small lymph nodes are noted in the hila bilaterally. Three enhancing pleural based mass lesions are noted in the left posterior costophrenic angle. These measure approximately 2.5 cm each. A celiac axis lymph node is noted which measures 15 mm in short axis which was not well seen on the prior exam. Along the inferior aspect of the cardiac border there is a 2.6 cm soft tissue lesion identified which was not seen on the prior exam consistent with enlarging lymphadenopathy. IMPRESSION: Enlarging left-sided pleural effusion. Additionally multiple pleural-based enhancing lesions are noted consistent with new metastatic deposits. Resolution of previously seen left upper lobe pleural-based lesion. Multiple newly enlarged mediastinal lymph nodes are noted. New pericardial lymphadenopathy as well as celiac axis lymphadenopathy is seen. Electronically Signed   By: Inez Catalina M.D.   On: 04/24/2015 16:27   US Thoracentesis Asp Pleural Space W/img Guide  04/26/2015  INDICATION: Lung cancer. Recurrent left pleural effusion. Request for diagnostic and therapeutic thoracentesis of up to 1.5 L max. EXAM: ULTRASOUND GUIDED LEFT THORACENTESIS MEDICATIONS: None. COMPLICATIONS: None immediate. PROCEDURE: An ultrasound guided thoracentesis was thoroughly discussed with the patient and questions answered. The benefits, risks, alternatives and complications were also discussed. The patient understands and wishes to proceed with the procedure. Written consent was obtained. Ultrasound was performed to localize and mark an adequate pocket of fluid in the left chest. The area was then prepped and draped in the normal sterile fashion. 1% Lidocaine was used for local anesthesia. Under ultrasound guidance a Safe-T-Centesis catheter was introduced. Thoracentesis was performed. The catheter was removed and a dressing applied. FINDINGS: A total of approximately  1.5 L of hazy, blood-tinged fluid was removed. Samples were sent to the laboratory as requested by the clinical team. IMPRESSION: Successful ultrasound guided left thoracentesis yielding 1.5 L of pleural fluid. Read by: Ascencion Dike PA-C Electronically Signed   By: Sandi Mariscal M.D.   On: 04/26/2015 10:25     PERTINENT LAB RESULTS: CBC:  Recent Labs  04/24/15 1322 04/25/15 0532  WBC 6.1 6.2  HGB 11.0* 10.2*  HCT 34.4* 33.1*  PLT 168 166   CMET CMP     Component Value Date/Time   NA 137 04/25/2015 0532   NA 139 03/05/2015 1237   K 4.5 04/25/2015 0532   K 4.5 03/05/2015 1237   CL 101 04/25/2015 0532   CO2 27 04/25/2015 0532   CO2 25 03/05/2015 1237   GLUCOSE 102* 04/25/2015 0532   GLUCOSE 224* 03/05/2015 1237   BUN 6 04/25/2015 0532   BUN 10.9 03/05/2015 1237   CREATININE 0.77 04/25/2015 0532   CREATININE 0.70 04/15/2015 1601   CREATININE 0.9 03/05/2015 1237  CALCIUM 9.2 04/25/2015 0532   CALCIUM 9.2 03/05/2015 1237   PROT 7.1 04/25/2015 1008   PROT 7.3 03/05/2015 1237   ALBUMIN 2.8* 03/05/2015 1237   ALBUMIN 2.9* 10/12/2014 0353   AST 18 03/05/2015 1237   AST 41 10/12/2014 0353   ALT 16 03/05/2015 1237   ALT 61 10/12/2014 0353   ALKPHOS 135 03/05/2015 1237   ALKPHOS 97 10/12/2014 0353   BILITOT 0.49 03/05/2015 1237   BILITOT 0.8 10/12/2014 0353   GFRNONAA >60 04/25/2015 0532   GFRAA >60 04/25/2015 0532    GFR Estimated Creatinine Clearance: 79.8 mL/min (by C-G formula based on Cr of 0.77). No results for input(s): LIPASE, AMYLASE in the last 72 hours. No results for input(s): CKTOTAL, CKMB, CKMBINDEX, TROPONINI in the last 72 hours. Invalid input(s): POCBNP No results for input(s): DDIMER in the last 72 hours. No results for input(s): HGBA1C in the last 72 hours. No results for input(s): CHOL, HDL, LDLCALC, TRIG, CHOLHDL, LDLDIRECT in the last 72 hours. No results for input(s): TSH, T4TOTAL, T3FREE, THYROIDAB in the last 72 hours.  Invalid input(s):  FREET3 No results for input(s): VITAMINB12, FOLATE, FERRITIN, TIBC, IRON, RETICCTPCT in the last 72 hours. Coags: No results for input(s): INR in the last 72 hours.  Invalid input(s): PT Microbiology: Recent Results (from the past 240 hour(s))  Gram stain     Status: None   Collection Time: 04/26/15 10:02 AM  Result Value Ref Range Status   Specimen Description FLUID LEFT PLEURAL  Final   Special Requests NONE  Final   Gram Stain   Final    FEW WBC PRESENT, PREDOMINANTLY MONONUCLEAR NO ORGANISMS SEEN    Report Status 04/26/2015 FINAL  Final     BRIEF HOSPITAL COURSE:  Pleural effusion: Given history of small cell cancer of the lung and CT chest findings of multiple pleural-based enhancing lesions consistent with metastatic deposits-likely malignant in nature. PCCM consulted-underwent ultrasound guided thoracocentesis on 3/24-pleural fluid consistent with lymphocytic exudate-highly suspicious of malignancy. Patient does not want to remain hospitalized-wants to go home today. He will follow with his oncologist/pulmonologist next week for further continued care. Note-he feels a lot better post thoracocentesis.CXR post thoracocentesis-?congestion-d/w Dr Bosie Clos try low dose diuretics to see if any improvement-note he does have some amount of chronic exertional dyspnea at basel. Also Note- Case discussed with Dr. Julien Nordmann on 3/23 over the phone-he advises outpatient follow-up with him, does not think patient has any inpatient oncology needs.  Active Problems: Small cell carcinoma of left lung: competed systemic chemotherapy December 2016, and just completed prophylactic cranial radiation -was under observation-given CT chest findings large and mediastinal lymphadenopathy, and pleural based enhancing lesions-hydrated suspicion for recurrence of cancer.Case discussed with Dr. Julien Nordmann  On 3/23 over the phone-he advises outpatient follow-up with him, does not think patient has any inpatient  oncology needs.  Atrial fibrillation/Flutter : continue Tikosyn and Coreg. Anticoagulation held since admission in anticipation of thoracocentesis. Patient does not want to pursue TTE as inpatient-can be done in the outpatient setting.Note-patient was scheduled for ablation on 3/27-I have reached out to EP team-Renee Ursay PA-C will touch base with EP MD to see if ablation needs to postponed-she will get in touch with the patient/family.   Insulin-dependent diabetes: CBGs stable-Continue Levemir 20 units daily  Constipation: resolved with laxative. Did claims to have intermittent dark appearing stools over the past few weeks-but Hb stable. No active/overt bleeding evident. He will follow up with his PCP for further needs.  Severe malnutrition in context of chronic illness:continue supplements  TODAY-DAY OF DISCHARGE:  Subjective:   Nyle Limb today has no headache,no chest abdominal pain,no new weakness tingling or numbness, feels much better wants to go home today.   Objective:   Blood pressure 104/65, pulse 105, temperature 97.9 F (36.6 C), temperature source Oral, resp. rate 18, height '5\' 10"'$  (1.778 m), weight 76.25 kg (168 lb 1.6 oz), SpO2 92 %.  Intake/Output Summary (Last 24 hours) at 04/26/15 1447 Last data filed at 04/26/15 0830  Gross per 24 hour  Intake    580 ml  Output    200 ml  Net    380 ml   Filed Weights   04/24/15 1814  Weight: 76.25 kg (168 lb 1.6 oz)    Exam Awake Alert, Oriented *3, No new F.N deficits, Normal affect [place].AT,PERRAL Supple Neck,No JVD, No cervical lymphadenopathy appriciated.  Symmetrical Chest wall movement, Good air movement bilaterally, CTAB RRR,No Gallops,Rubs or new Murmurs, No Parasternal Heave +ve B.Sounds, Abd Soft, Non tender, No organomegaly appriciated, No rebound -guarding or rigidity. No Cyanosis, Clubbing or edema, No new Rash or bruise  DISCHARGE CONDITION: Stable  DISPOSITION: Home  DISCHARGE INSTRUCTIONS:      Activity:  As tolerated   Get Medicines reviewed and adjusted: Please take all your medications with you for your next visit with your Primary MD  Please request your Primary MD to go over all hospital tests and procedure/radiological results at the follow up, please ask your Primary MD to get all Hospital records sent to his/her office.  If you experience worsening of your admission symptoms, develop shortness of breath, life threatening emergency, suicidal or homicidal thoughts you must seek medical attention immediately by calling 911 or calling your MD immediately  if symptoms less severe.  You must read complete instructions/literature along with all the possible adverse reactions/side effects for all the Medicines you take and that have been prescribed to you. Take any new Medicines after you have completely understood and accpet all the possible adverse reactions/side effects.   Do not drive when taking Pain medications.   Do not take more than prescribed Pain, Sleep and Anxiety Medications  Special Instructions: If you have smoked or chewed Tobacco  in the last 2 yrs please stop smoking, stop any regular Alcohol  and or any Recreational drug use.  Wear Seat belts while driving.  Please note  You were cared for by a hospitalist during your hospital stay. Once you are discharged, your primary care physician will handle any further medical issues. Please note that NO REFILLS for any discharge medications will be authorized once you are discharged, as it is imperative that you return to your primary care physician (or establish a relationship with a primary care physician if you do not have one) for your aftercare needs so that they can reassess your need for medications and monitor your lab values.   Diet recommendation: Diabetic Diet Heart Healthy diet  Discharge Instructions    (HEART FAILURE PATIENTS) Call MD:  Anytime you have any of the following symptoms: 1) 3 pound weight  gain in 24 hours or 5 pounds in 1 week 2) shortness of breath, with or without a dry hacking cough 3) swelling in the hands, feet or stomach 4) if you have to sleep on extra pillows at night in order to breathe.    Complete by:  As directed      Call MD for:  difficulty breathing, headache or visual  disturbances    Complete by:  As directed      Diet - low sodium heart healthy    Complete by:  As directed      Diet Carb Modified    Complete by:  As directed      Increase activity slowly    Complete by:  As directed            Follow-up Information    Follow up with [name], MD. Schedule an appointment as soon as possible for a visit in 1 week.   Specialty:  Internal Medicine   Contact information:   [place] [address] [phone]       Follow up with Eilleen Kempf., MD On 04/22/2015.   Specialty:  Oncology   Why:  call office to make a appointment. Need to follow up on pleural fluid cytology result   Contact information:   [address] [place] [address] [phone]       Follow up with Rexene Edison, NP On 05/06/2015.   Specialty:  Pulmonary Disease   Why:  Hospital follow up-cytology pending. Appointment at 2 pm   Contact information:   [address] [place] [address] [phone]      Total Time spent on discharge equals  45 minutes.  SignedOren Binet 04/26/2015 2:47 PM

## 2015-04-26 NOTE — Progress Notes (Signed)
Feels better post thoracocentesis-wants to go home-doesn't want to stay inpatient to await cytology results.  Pleural Fluid-exudate by lights criteria-is likely malignant until proven otherwise. CXR-?CHF-will start low dose diuretics.  He will follow with Primary Oncologist for further needs. Will discharge him at his own request. See d/c summary for further details.

## 2015-04-26 NOTE — Care Management Important Message (Signed)
Important Message  Patient Details  Name: Karl [name] MRN: [id number] Date of Birth: 12/29/1935   Medicare Important Message Given:  Yes    Tiffancy Moger P [place] 04/26/2015, 12:52 PM

## 2015-04-27 LAB — MISC LABCORP TEST (SEND OUT): Labcorp test code: 88062

## 2015-04-27 LAB — TRIGLYCERIDES, BODY FLUIDS: Triglycerides, Fluid: 26 mg/dL

## 2015-04-29 ENCOUNTER — Ambulatory Visit (HOSPITAL_COMMUNITY): Admission: RE | Admit: 2015-04-29 | Payer: Medicare Other | Source: Ambulatory Visit | Admitting: Internal Medicine

## 2015-04-29 ENCOUNTER — Encounter (HOSPITAL_COMMUNITY): Admission: RE | Payer: Self-pay | Source: Ambulatory Visit

## 2015-04-29 LAB — PH, BODY FLUID: PH, BODY FLUID: 7.9

## 2015-04-29 SURGERY — A-FLUTTER/A-TACH/SVT ABLATION

## 2015-04-30 ENCOUNTER — Encounter: Payer: Self-pay | Admitting: Internal Medicine

## 2015-04-30 ENCOUNTER — Ambulatory Visit (HOSPITAL_BASED_OUTPATIENT_CLINIC_OR_DEPARTMENT_OTHER): Payer: Medicare Other | Admitting: Internal Medicine

## 2015-04-30 ENCOUNTER — Telehealth: Payer: Self-pay | Admitting: Medical Oncology

## 2015-04-30 ENCOUNTER — Telehealth: Payer: Self-pay | Admitting: Internal Medicine

## 2015-04-30 VITALS — BP 126/60 | HR 106 | Temp 97.7°F | Resp 28 | Wt 167.6 lb

## 2015-04-30 DIAGNOSIS — E440 Moderate protein-calorie malnutrition: Secondary | ICD-10-CM

## 2015-04-30 DIAGNOSIS — J90 Pleural effusion, not elsewhere classified: Secondary | ICD-10-CM | POA: Diagnosis not present

## 2015-04-30 DIAGNOSIS — C3492 Malignant neoplasm of unspecified part of left bronchus or lung: Secondary | ICD-10-CM | POA: Diagnosis not present

## 2015-04-30 NOTE — Progress Notes (Signed)
[facility] [phone]   Fax:([phone] [place], [place] [address]  DIAGNOSIS: Metastatic small cell lung cancer initially diagnosed as Limited stage IIIB (T3, N3, M0) small cell lung cancer diagnosed in August 2016.  PRIOR THERAPY: Systemic chemotherapy was carboplatin for AUC of 5 on day 1 and etoposide 120 MG/M2 on days 1, 2 and 3 with Neulasta support on day 4. Status post 6 cycles. Last dose was given 01/21/2015,  with concurrent with radiotherapy under the care of Dr. Lisbeth Renshaw.  CURRENT THERAPY: Palliative and hospice care.  INTERVAL HISTORY: Karl [name] 80 y.o. male returns to the clinic today for follow-up visit accompanied by several family members including his wife, son, daughter and grandson. The patient has been on observation for the last few months but his condition had deteriorated significantly. He was admitted recently to [facility] and repeat CT scan of the chest showed significant disease progression with recurrent left pleural effusion. He underwent ultrasound-guided left thoracentesis with drainage of 1.5 L of fluid. He is very weak and tired. He continues to have pain on the left side of the chest as well as shortness breath at baseline and increased with exertion and cough but no hemoptysis He denied having any significant night sweats. No current fever or chills, no nausea or vomiting. He is here today for evaluation and discussion of his treatment options.  MEDICAL HISTORY: Past Medical History  Diagnosis Date  . Coronary artery disease     status post CABG by Dr. Redmond Pulling in 1993 and status post percutaneous transluminal coronary angioplasty and stenting by Dr. Wynonia Lawman in 2002 and 2006  . Carotid artery occlusion     severe left internal carotid artery stenosis, asymptomatic status post carotid endarterectomy  . Hypertensive heart disease without CHF   . Carotid artery  disease   . Hyperlipidemia   . ICD (implantable cardiac defibrillator) in place   . CHF (congestive heart failure) ([place])   . Anginal pain ([place])   . Pacemaker   . Type 2 diabetes mellitus with vascular disease ([place]) 12/11/2008  . H/O hiatal hernia     "gone after my bypass"  . Arthritis     "neck, back, knees"  . Ischemic cardiomyopathy   . Atrial fibrillation ([place])   . Rheumatoid arthritis(714.0)   . AICD (automatic cardioverter/defibrillator) present   . Allergy   . Myocardial infarction ([place]) ~ 1992  . Antineoplastic chemotherapy induced anemia 12/24/2014  . Neutropenic fever ([place]) 10/11/2014  . Shortness of breath 10/24/2012    June 2014 full pulmonary function testing> ratio 86%, FEV1 2.91 L (96% predicted), no change with bronchodilator, total lung capacity 5.46 L (74% predicted) DLCO 15.09 (47% predicted) May 2014 CT chest>> there is no evidence of a pulmonary embolism, there is centrilobular emphysema seen throughout with significant paraseptal emphysema. There is also intralobular septal thickening and groundglass throughout the lungs and periphery in a fairly diffuse pattern. There no prior studies for further evaluation. 11/2012 CT Chest > no comment if progression from prior studies, persistent ground glass and interlobular septal thickening with emphysema   . 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 Lung cancer ([place]) 09/11/14    left upper lobe lung  . S/P radiation therapy 01/25/15 completed    left upper lung    ALLERGIES:  is allergic to amiodarone and rosiglitazone-metformin.  MEDICATIONS:  Current Outpatient Prescriptions  Medication Sig Dispense Refill  . carvedilol (COREG) 3.125 MG tablet Take 6.25 mg by mouth 2 (two) times daily with a meal. Reported on 01/18/2015    . chlorpheniramine-HYDROcodone (TUSSIONEX PENNKINETIC ER) 10-8 MG/5ML SUER Take 5 mLs by mouth every 12 (twelve) hours as needed for cough. 140 mL 0  . Cyanocobalamin (VITAMIN B-12) 2500 MCG SUBL Place 2,500  mcg under the tongue daily.     Marland Kitchen dofetilide (TIKOSYN) 250 MCG capsule Take 250 mcg by mouth 2 (two) times daily.    Marland Kitchen ELIQUIS 5 MG TABS tablet Take 1 tablet by mouth 2 (two) times daily.    . feeding supplement, ENSURE ENLIVE, (ENSURE ENLIVE) LIQD Take 237 mLs by mouth 3 (three) times daily between meals. 90 Bottle 12  . furosemide (LASIX) 20 MG tablet Take 1 tablet (20 mg total) by mouth daily. 30 tablet 0  . insulin detemir (LEVEMIR) 100 UNIT/ML injection Inject 0.2 mLs (20 Units total) into the skin daily. 10 mL 11  . Linaclotide (LINZESS) 145 MCG CAPS capsule Take 145 mcg by mouth daily.    . Omega-3 Fatty Acids (FISH OIL) 1000 MG CAPS Take 1,000 mg by mouth 2 (two) times daily.     . polyethylene glycol (MIRALAX / GLYCOLAX) packet Take 17 g by mouth 2 (two) times daily. 14 each 0  . potassium chloride (K-DUR) 10 MEQ tablet Take 1 tablet (10 mEq total) by mouth daily. 30 tablet 0  . simvastatin (ZOCOR) 40 MG tablet Take 40 mg by mouth daily.     . nitroGLYCERIN (NITROSTAT) 0.4 MG SL tablet Place 0.4 mg under the tongue every 5 (five) minutes as needed for chest pain. Reported on 04/30/2015     No current facility-administered medications for this visit.   Facility-Administered Medications Ordered in Other Visits  Medication Dose Route Frequency Provider Last Rate Last Dose  . heparin lock flush 100 unit/mL  500 Units Intracatheter Once PRN Curt Bears, MD      . LORazepam (ATIVAN) injection 0.5 mg  0.5 mg Intravenous PRN Curt Bears, MD      . sodium chloride 0.9 % injection 10 mL  10 mL Intracatheter PRN Curt Bears, MD        SURGICAL HISTORY:  Past Surgical History  Procedure Laterality Date  . Carotid endarterectomy  2009    left  . Cardiac defibrillator placement  05/2006    St Jude   . Eye surgery  March 2013    Cataract Left eye  . Eye surgery  06/08/2011    Cataract Right eye  . Pr vein bypass graft,aorto-fem-pop    . Inguinal hernia repair  1970's     bilaterally  . Hemorrhoid surgery  1970's  . Coronary angioplasty with stent placement      "I've got 2" (08/10/11)  . Cardioversion  08/2010  . Insert / replace / remove pacemaker  05/2006    "got a defibrillator/pacemaker" (08/10/11)  . Coronary artery bypass graft  08/1991    CABG X5  . Cardioversion  08/13/2011    Procedure: CARDIOVERSION;  Surgeon: Jacolyn Reedy, MD;  Location: [facility];  Service: Cardiovascular;  Laterality: N/A;  . Left heart catheterization with coronary/graft angiogram N/A 06/16/2012    Procedure: LEFT HEART CATHETERIZATION WITH Beatrix Fetters;  Surgeon: Jacolyn Reedy, MD;  Location: [facility] CATH LAB;  Service: Cardiovascular;  Laterality: N/A;  . Video bronchoscopy Bilateral 09/11/2014    Procedure: VIDEO BRONCHOSCOPY WITH FLUORO;  Surgeon: Juanito Doom,  MD;  Location: [facility];  Service: Cardiopulmonary;  Laterality: Bilateral;  . Cataract surgery Bilateral   . Ep implantable device N/A 11/26/2014    Procedure:  ICD Generator Removal /Can only;  Surgeon: Evans Lance, MD;  Location: [facility] CV LAB;  Service: Cardiovascular;  Laterality: N/A;    REVIEW OF SYSTEMS:  Constitutional: positive for anorexia, fatigue and weight loss Eyes: negative Ears, nose, mouth, throat, and face: negative Respiratory: positive for cough, dyspnea on exertion and pleurisy/chest pain Cardiovascular: negative Gastrointestinal: negative Genitourinary:negative Integument/breast: negative Hematologic/lymphatic: negative Musculoskeletal:positive for muscle weakness Neurological: negative Behavioral/Psych: negative Endocrine: negative Allergic/Immunologic: negative   PHYSICAL EXAMINATION: General appearance: alert, cooperative, fatigued and no distress Head: Normocephalic, without obvious abnormality, atraumatic Neck: no adenopathy, no JVD, supple, symmetrical, trachea midline and thyroid not enlarged, symmetric, no tenderness/mass/nodules Lymph nodes: Cervical,  supraclavicular, and axillary nodes normal. Resp: clear to auscultation bilaterally Back: symmetric, no curvature. ROM normal. No CVA tenderness. Cardio: regular rate and rhythm, S1, S2 normal, no murmur, click, rub or gallop GI: soft, non-tender; bowel sounds normal; no masses,  no organomegaly Extremities: extremities normal, atraumatic, no cyanosis or edema Neurologic: Alert and oriented X 3, normal strength and tone. Normal symmetric reflexes. Normal coordination and gait  ECOG PERFORMANCE STATUS: 1 - Symptomatic but completely ambulatory  There were no vitals taken for this visit.  LABORATORY DATA: Lab Results  Component Value Date   WBC 6.2 04/25/2015   HGB 10.2* 04/25/2015   HCT 33.1* 04/25/2015   MCV 88.5 04/25/2015   PLT 166 04/25/2015      Chemistry      Component Value Date/Time   NA 137 04/25/2015 0532   NA 139 03/05/2015 1237   K 4.5 04/25/2015 0532   K 4.5 03/05/2015 1237   CL 101 04/25/2015 0532   CO2 27 04/25/2015 0532   CO2 25 03/05/2015 1237   BUN 6 04/25/2015 0532   BUN 10.9 03/05/2015 1237   CREATININE 0.77 04/25/2015 0532   CREATININE 0.70 04/15/2015 1601   CREATININE 0.9 03/05/2015 1237      Component Value Date/Time   CALCIUM 9.2 04/25/2015 0532   CALCIUM 9.2 03/05/2015 1237   ALKPHOS 135 03/05/2015 1237   ALKPHOS 97 10/12/2014 0353   AST 18 03/05/2015 1237   AST 41 10/12/2014 0353   ALT 16 03/05/2015 1237   ALT 61 10/12/2014 0353   BILITOT 0.49 03/05/2015 1237   BILITOT 0.8 10/12/2014 0353       RADIOGRAPHIC STUDIES: Dg Chest 1 View  04/26/2015  CLINICAL DATA:  Status post left-sided thoracentesis EXAM: CHEST 1 VIEW COMPARISON:  Chest radiograph and chest CT April 24, 2015 FINDINGS: No pneumothorax. Left effusion is smaller post thoracentesis. There is generalized interstitial and patchy alveolar edema, stable. There is a small residual left effusion with a mild loculated component laterally. There is cardiomegaly with pulmonary venous  hypertension. Pacemaker lead remains attached to the right ventricle. Patient is status post coronary artery bypass grafting. Bones are osteoporotic. IMPRESSION: No demonstrable pneumothorax. Persistent congestive heart failure. The degree of interstitial and patchy alveolar edema remain stable. There is stable cardiomegaly with pulmonary venous hypertension. Electronically Signed   By: Lowella Grip III M.D.   On: 04/26/2015 10:19   Dg Chest 2 View  04/24/2015  CLINICAL DATA:  Shortness of breath.  Lung cancer. EXAM: CHEST  2 VIEW COMPARISON:  02/22/2015. FINDINGS: The heart is enlarged. Coronary stents are present. Single lead pacer from a LEFT subclavian approach, battery  pack has been removed. The single lead remains in good position in the RIGHT ventricle. Worsening aeration with volume loss and new large LEFT pleural effusion, concerning for recurrent malignancy. Increased opacity centrally around the LEFT hilum. Chronic markings the RIGHT lung, stable. No acute osseous findings. Prior CABG. IMPRESSION: Marked worsening LEFT pleural effusion. Consider further evaluation with chest CT scan with contrast. Electronically Signed   By: Staci Righter M.D.   On: 04/24/2015 13:49   Ct Chest W Contrast  04/24/2015  CLINICAL DATA:  Shortness of breath since October of 2016 EXAM: CT CHEST WITH CONTRAST TECHNIQUE: Multidetector CT imaging of the chest was performed during intravenous contrast administration. CONTRAST:  66m OMNIPAQUE IOHEXOL 300 MG/ML  SOLN COMPARISON:  02/08/2015 FINDINGS: Right lung again demonstrates diffuse fibrotic changes similar to that seen on the prior exam. Previously seen right-sided pleural effusion has resolved. On the left there is now a large left pleural effusion which has increased in size when compared with the prior exam. The known pleural based left apical mass lesion has regressed in the interval. There again no left upper lobe opacity similar to that seen on the prior exam  likely representing a combination of radiation change and treated tumor. This is stable from the prior exam. In the left lower lobe there is now seen a 4 cm mass lesion which was not well appreciated on the prior exam consistent with recurrent disease. It appears centrally necrotic. Multiple small mediastinal lymph nodes are identified but slightly more prominent than that seen on the prior exam. The largest of these nodes lies in the right peritracheal region. It measures 17 mm. Previously it measured 7 mm. Enlargement of the precarinal lymph node is noted as well. It measures 16 mm in short axis and previously measured 12 mm with normal fatty hilus. The fatty hilus has now been eliminated. Heavy coronary calcifications are seen. Scattered small lymph nodes are noted in the hila bilaterally. Three enhancing pleural based mass lesions are noted in the left posterior costophrenic angle. These measure approximately 2.5 cm each. A celiac axis lymph node is noted which measures 15 mm in short axis which was not well seen on the prior exam. Along the inferior aspect of the cardiac border there is a 2.6 cm soft tissue lesion identified which was not seen on the prior exam consistent with enlarging lymphadenopathy. IMPRESSION: Enlarging left-sided pleural effusion. Additionally multiple pleural-based enhancing lesions are noted consistent with new metastatic deposits. Resolution of previously seen left upper lobe pleural-based lesion. Multiple newly enlarged mediastinal lymph nodes are noted. New pericardial lymphadenopathy as well as celiac axis lymphadenopathy is seen. Electronically Signed   By: MInez CatalinaM.D.   On: 04/24/2015 16:27   [place]oracentesis Asp Pleural Space W/img Guide  04/26/2015  INDICATION: Lung cancer. Recurrent left pleural effusion. Request for diagnostic and therapeutic thoracentesis of up to 1.5 L max. EXAM: ULTRASOUND GUIDED LEFT THORACENTESIS MEDICATIONS: None. COMPLICATIONS: None immediate.  PROCEDURE: An ultrasound guided thoracentesis was thoroughly discussed with the patient and questions answered. The benefits, risks, alternatives and complications were also discussed. The patient understands and wishes to proceed with the procedure. Written consent was obtained. Ultrasound was performed to localize and mark an adequate pocket of fluid in the left chest. The area was then prepped and draped in the normal sterile fashion. 1% Lidocaine was used for local anesthesia. Under ultrasound guidance a Safe-T-Centesis catheter was introduced. Thoracentesis was performed. The catheter was removed and a dressing applied. FINDINGS:  A total of approximately 1.5 L of hazy, blood-tinged fluid was removed. Samples were sent to the laboratory as requested by the clinical team. IMPRESSION: Successful ultrasound guided left thoracentesis yielding 1.5 L of pleural fluid. Read by: Ascencion Dike PA-C Electronically Signed   By: Sandi Mariscal M.D.   On: 04/26/2015 10:25    ASSESSMENT AND PLAN: This is a very pleasant 80 years old white male with limited stage small cell lung cancer currently undergoing systemic chemotherapy with carboplatin and etoposide status post 6 cycles concurrent with radiation during the last 2 cycles. The patient has been observation for the last few months but unfortunately the recent CT scan of the chest showed significant disease progression with enlarging left-sided pleural effusion as well as multiple pleural-based lesion and enlarged lymphadenopathy. I had a lengthy discussion with the patient and his family about his current condition and treatment options. I offered the patient treatment with second line systemic chemotherapy versus palliative care and hospice. The patient is not interested in proceeding with any further chemotherapy at this point. I will refer him to the palliative and hospice care of [facility]. I would see the patient an as-needed basis at this point. The patient  was advised to call immediately if he has any concerning symptoms in the interval. The patient voices understanding of current disease status and treatment options and is in agreement with the current care plan.  All questions were answered. The patient knows to call the clinic with any problems, questions or concerns. We can certainly see the patient much sooner if necessary.  Disclaimer: This note was dictated with voice recognition software. Similar sounding words can inadvertently be transcribed and may not be corrected upon review.

## 2015-04-30 NOTE — Telephone Encounter (Signed)
cxl appts per 3/28 pof due to patient going on hospice care

## 2015-04-30 NOTE — Telephone Encounter (Signed)
Hospice referral done. 

## 2015-05-01 LAB — CULTURE, BODY FLUID-BOTTLE: CULTURE: NO GROWTH

## 2015-05-01 LAB — CULTURE, BODY FLUID W GRAM STAIN -BOTTLE

## 2015-05-06 ENCOUNTER — Inpatient Hospital Stay: Payer: Medicare Other | Admitting: Adult Health

## 2015-05-13 ENCOUNTER — Telehealth: Payer: Self-pay | Admitting: Internal Medicine

## 2015-05-13 ENCOUNTER — Encounter: Payer: Medicare Other | Admitting: Internal Medicine

## 2015-05-13 NOTE — Progress Notes (Signed)
°  Radiation Oncology         [phone] ________________________________  Name: Karl [name] MRN: [id number]  Date: 04/08/2015  DOB: 1935/02/09  End of Treatment Note  Diagnosis:   Malignant neoplasm of bronchus of left upper lobe     Indication for treatment:  Prophylactic       Radiation treatment dates:   03/26/15 - 04/08/15  Site/dose:   Whole brain treated to 25 Gy in 10 fractions  Beams/energy:   Whole brain: 2 field/ 6X   Narrative: The patient tolerated radiation treatment relatively well.     Plan: The patient has completed radiation treatment. The patient will return to radiation oncology clinic for routine followup in one month. I advised them to call or return sooner if they have any questions or concerns related to their recovery or treatment.  ------------------------------------------------  Jodelle Gross, MD, PhD  This document serves as a record of services personally performed by Kyung Rudd, MD. It was created on his behalf by Derek Mound, a trained medical scribe. The creation of this record is based on the scribe's personal observations and the provider's statements to them. This document has been checked and approved by the attending provider.

## 2015-05-13 NOTE — Telephone Encounter (Signed)
New Message  Pt is now in hospice sch for eval for possible ablation. Is the appt still needed.

## 2015-05-13 NOTE — Telephone Encounter (Signed)
Procedure canceled  [facility] aware

## 2015-05-14 ENCOUNTER — Ambulatory Visit: Payer: Self-pay | Admitting: Radiation Oncology

## 2015-05-14 ENCOUNTER — Ambulatory Visit
Admission: RE | Admit: 2015-05-14 | Discharge: 2015-05-14 | Disposition: A | Discharge status: Home / Self Care | Payer: Medicare Other | Source: Ambulatory Visit | Attending: Radiation Oncology | Admitting: Radiation Oncology

## 2015-05-15 ENCOUNTER — Telehealth: Payer: Self-pay | Admitting: *Deleted

## 2015-05-15 NOTE — Telephone Encounter (Signed)
Hospice RN called to advised MD Dr. Clifton James has rx pt 2 antibiotics for an infection on right side of face/jac below the ear. Clyndimycin 450 q 8hrs x 10 days and Cipro '500mg'$  BID x 10 days. No further concerns.

## 2015-06-03 ENCOUNTER — Other Ambulatory Visit: Payer: Medicare Other

## 2015-06-03 ENCOUNTER — Ambulatory Visit (HOSPITAL_COMMUNITY): Payer: Medicare Other

## 2015-06-03 DEATH — deceased

## 2015-06-10 ENCOUNTER — Ambulatory Visit: Payer: Medicare Other | Admitting: Internal Medicine

## 2015-06-11 ENCOUNTER — Ambulatory Visit: Payer: Medicare Other | Admitting: Internal Medicine

## 2015-06-27 ENCOUNTER — Ambulatory Visit: Payer: Medicare Other | Admitting: Pulmonary Disease

## 2015-07-09 ENCOUNTER — Encounter (HOSPITAL_COMMUNITY): Payer: Medicare Other

## 2015-07-09 ENCOUNTER — Ambulatory Visit: Payer: Medicare Other | Admitting: Family

## 2016-10-22 IMAGING — CT CT CHEST W/O CM
2 of 4 series · 13 of 36 positions shown, 16 images · IV contrast (Omnipaque 300)
Comparison: Most recent chest CT from 11/30/2012.

CLINICAL DATA: 78-year-old male former smoker (quit in [id number]) with
intermittent hemoptysis, chest pain and shortness of breath for
weeks.

EXAM:
CT CHEST WITHOUT CONTRAST
TECHNIQUE: Multidetector CT imaging of the chest was performed following the
standard protocol without IV contrast.

[Series 2: chest routine with · axial · 0.81mm/px · z∈[-310,-34]mm · 10 of 65 slices shown, 13 images]
[im 5/65  mediastinal]
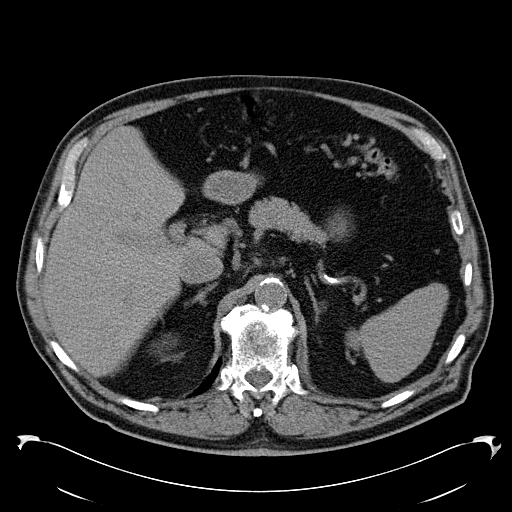
[im 5/65  lung]
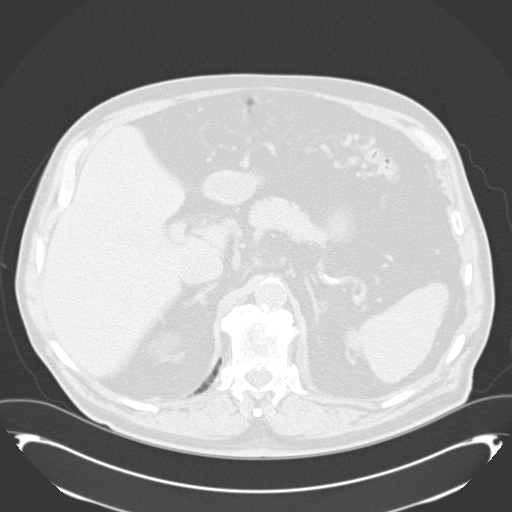
[im 10/65  lung]
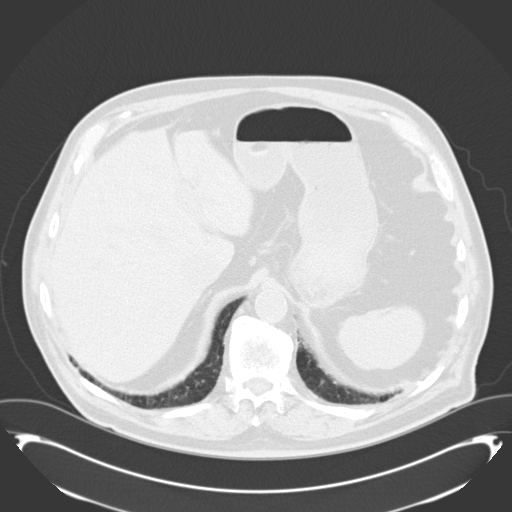
[im 20/65  lung]
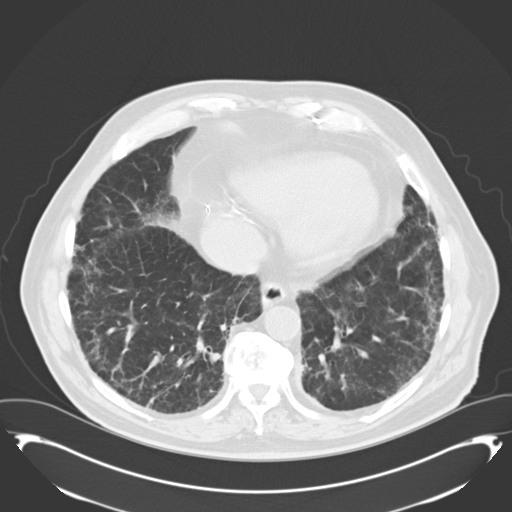
[im 25/65  lung]
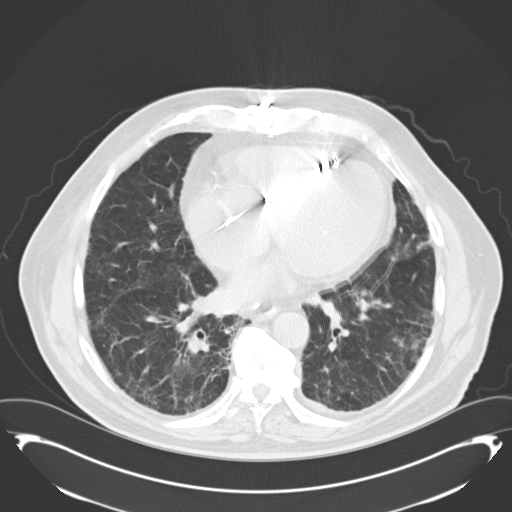
[im 30/65  mediastinal]
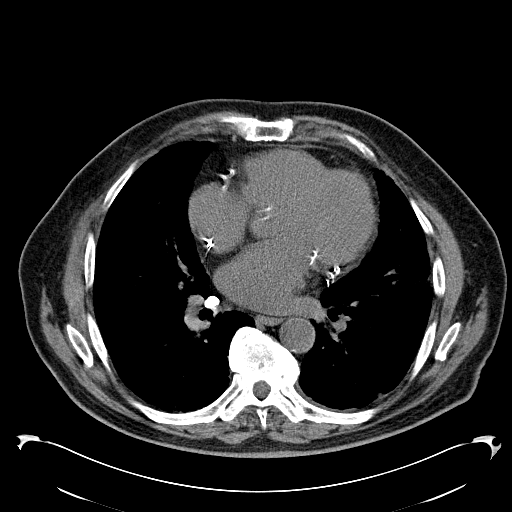
[im 30/65  lung]
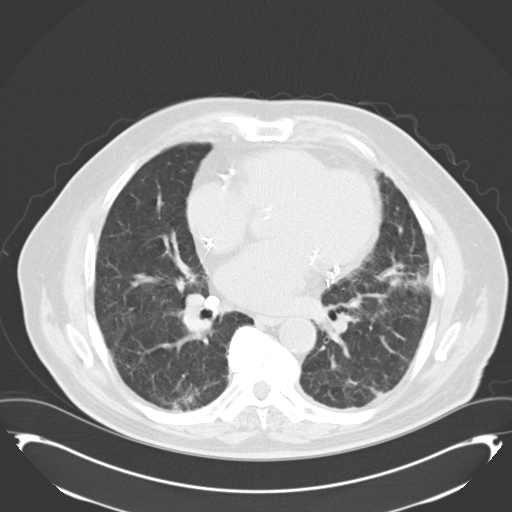
[im 35/65  lung]
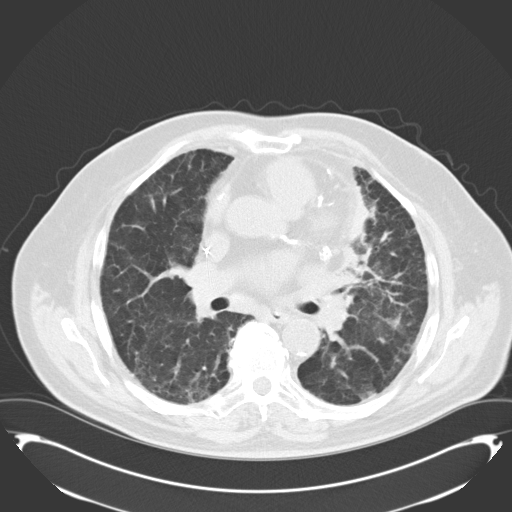
[im 40/65  lung]
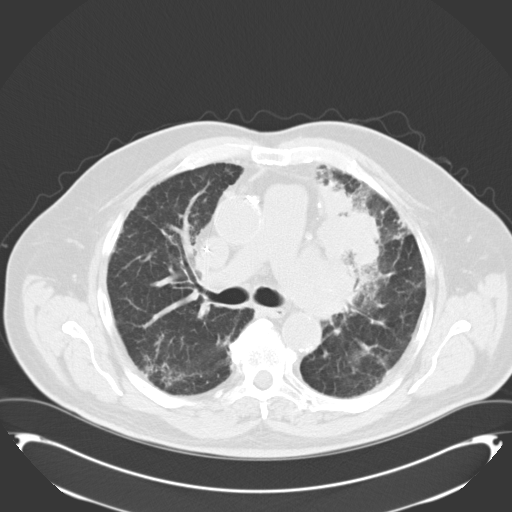
[im 50/65  lung]
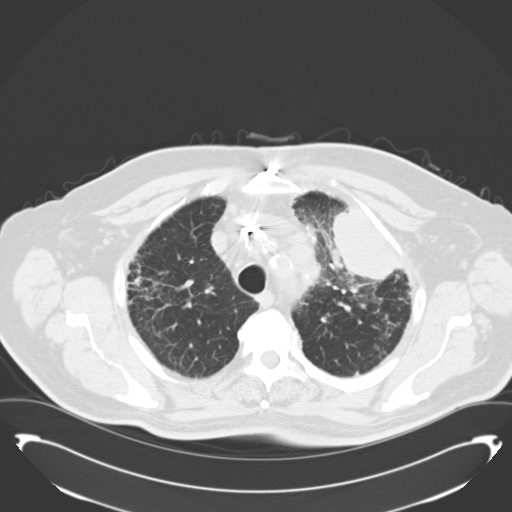
[im 55/65  mediastinal]
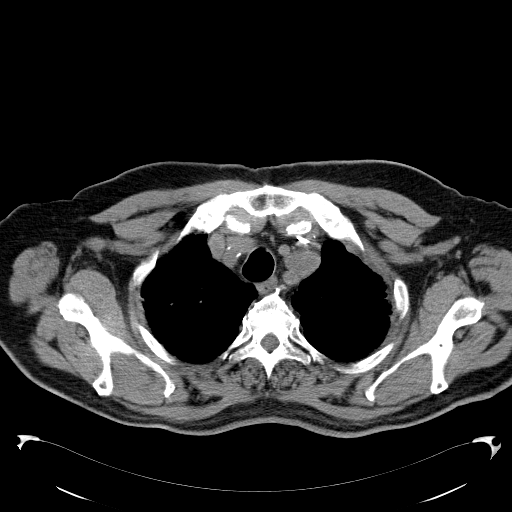
[im 55/65  lung]
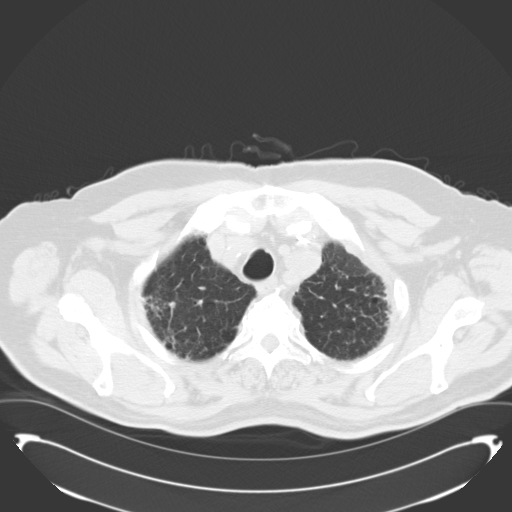
[im 60/65  lung]
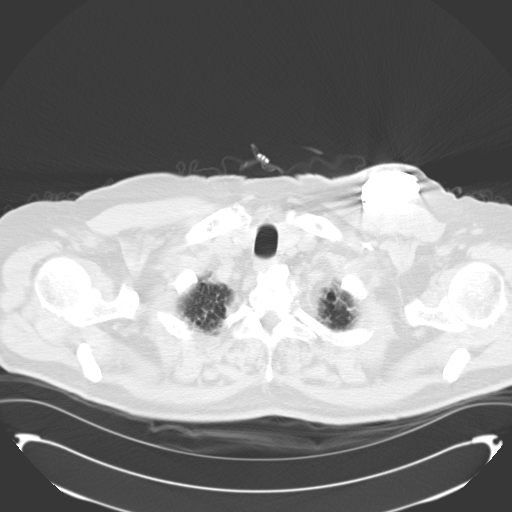

[Series 602: cor · coronal · 0.81mm/px · 3 of 142 slices shown]
[im 29/142  lung]
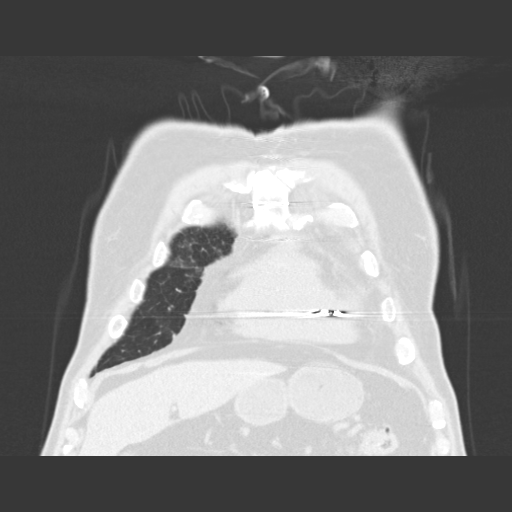
[im 57/142  lung]
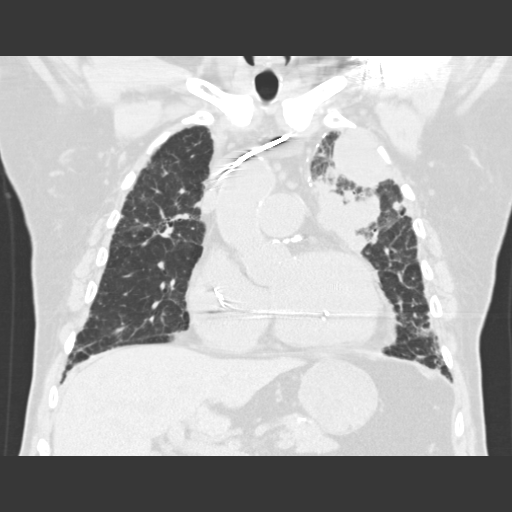
[im 85/142  lung]
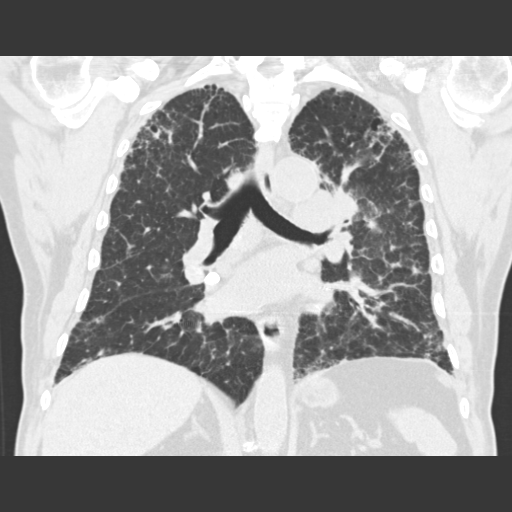

[13 of 36 positions shown; findings below may reference images not displayed]

FINDINGS: Mediastinum/Nodes: Stable mild cardiomegaly. Well-positioned single
lead left subclavian ICD with lead tip in the right ventricular
apex. There is atherosclerosis of the thoracic aorta, the great
vessels of the mediastinum and the coronary arteries, including
calcified atherosclerotic plaque in the left main, left anterior
descending, left circumflex and right coronary arteries. Post CABG
changes are noted with left internal mammary and ascending aortic
grafts, which are not evaluated on this noncontrast study. Great
vessels are normal in course and caliber. No central pulmonary
emboli. Normal visualized thyroid. Normal esophagus. No axillary
lymphadenopathy. There is new extensive mediastinal lymphadenopathy.
No supraclavicular lymphadenopathy is seen. A mildly enlarged 1.6 cm
lower right paratracheal node (series 2/image 24) is increased from
1.3 cm on 11/30/2012. There are multiple new mildly to moderately
enlarged prevascular and aortopulmonary window left mediastinal
lymph nodes, including a 2.6 cm high left mediastinal node ([DATE]), a
1.9 cm anterior left prevascular node ([DATE]) and a 1.6 cm
aortopulmonary window node ([DATE]). There is a mildly enlarged 1.0 cm
short axis right anterior mediastinal node ([DATE]). There is a mildly
enlarged 1.5 cm short axis subcarinal node ([DATE]). There is
extensive left hilar lymphadenopathy, which is confluent with the
left upper lobe masses, with the largest left hilar node measuring
approximately 4.0 cm short axis ([DATE]). Multiple nonenlarged
coarsely calcified paratracheal, subcarinal and bilateral hilar
lymph nodes are unchanged from the prior chest CT, in keeping with
prior granulomatous disease. No right hilar adenopathy is
appreciated, noting limited sensitivity for the detection of hilar
adenopathy on a noncontrast study.

Lungs/Pleura: No pneumothorax. No pleural effusion. There is a new
peripheral pleural-based left upper lobe 6.1 x 5.3 cm mass ([DATE]),
which demonstrates direct extension into the interspace between the
anterior left first and second ribs ([DATE]). There are multiple
confluent central/para-mediastinal left upper lobe pulmonary nodules
of various size, measuring 10.4 x 5.1 cm in conglomerate (series
3/image 25), which is confluent with the infiltrative left hilar
lymphadenopathy, which may invade the mediastinum ([DATE]), and which
demonstrates encasement and extrinsic narrowing of the associated
left upper lobe segmental bronchus, suspicious for confluent
satellite left upper lobe tumors. There is patchy ground-glass
opacity in the left upper lobe surrounding the masses. There are
separate 0.7 cm and 0.7 cm pulmonary nodules in the lingula (3/32
and 3/33). Re- demonstrated are diffuse peripheral predominant
changes of subpleural reticulation, architectural distortion,
traction bronchiectasis and mild ground-glass component, not
appreciably changed since 11/30/2012, most suggestive of fibrotic
phase nonspecific interstitial pneumonia (NSIP). No significant
pulmonary nodules are detected in the right lung.

Upper abdomen: Unremarkable.

Musculoskeletal: No suspicious focal osseous lesions. Moderate to
marked degenerative changes in the thoracic spine. Median sternotomy
wires appear intact.
IMPRESSION: 1. Pleural-based 6.1 cm left upper lobe mass, highly suspicious for
a primary bronchogenic malignancy, with likely visceral and parietal
pleural invasion and possible chest wall invasion between the first
and second ribs, likely T3 disease. Numerous confluent satellite
nodules throughout the left upper lobe, suspicious for ipsilateral
metastases within the same lobe. These masses are likely amenable to
tissue sampling either via bronchoscopic or CT-guided percutaneous
biopsy approach.
2. Extensive left hilar and mediastinal lymphadenopathy as
described, including a lower right paratracheal pathologic node,
suspicious for N3 metastatic nodal disease. Overall, the chest CT
findings are suspicious for at least stage IIIB lung cancer
([id number]).
3. Patchy ground-glass opacity in the left upper lobe surrounding
the masses, nonspecific, possibly postobstructive pneumonitis or
peri-tumoral hemorrhage.
4. Stable underlying moderate severity interstitial lung disease,
likely fibrotic phase nonspecific interstitial pneumonia (NSIP).
5. Atherosclerosis, including left main and 3 vessel coronary artery
disease, status post CABG.

These results were called by telephone at the time of interpretation
on 09/07/2014 at [DATE] to Dr. ZEINAB TIGER , who verbally
acknowledged these results.

## 2016-11-25 IMAGING — CR DG CHEST 2V
2 series · 2 of 2 positions shown · non-contrast
Comparison: PET-CT 09/28/2014. CT chest 09/07/2014 dating back to
06/29/2012. Two-view chest x-ray 07/31/2011.

CLINICAL DATA: Patient currently undergoing chemotherapy for
metastatic left upper lobe lung cancer, most recent chemotherapy 1
week ago, seen at the [HOSPITAL] yesterday and informed bat he
has leukopenia, presenting with acute onset fever and chills 104
degrees F.

EXAM:
CHEST  2 VIEW

[w chest lat]
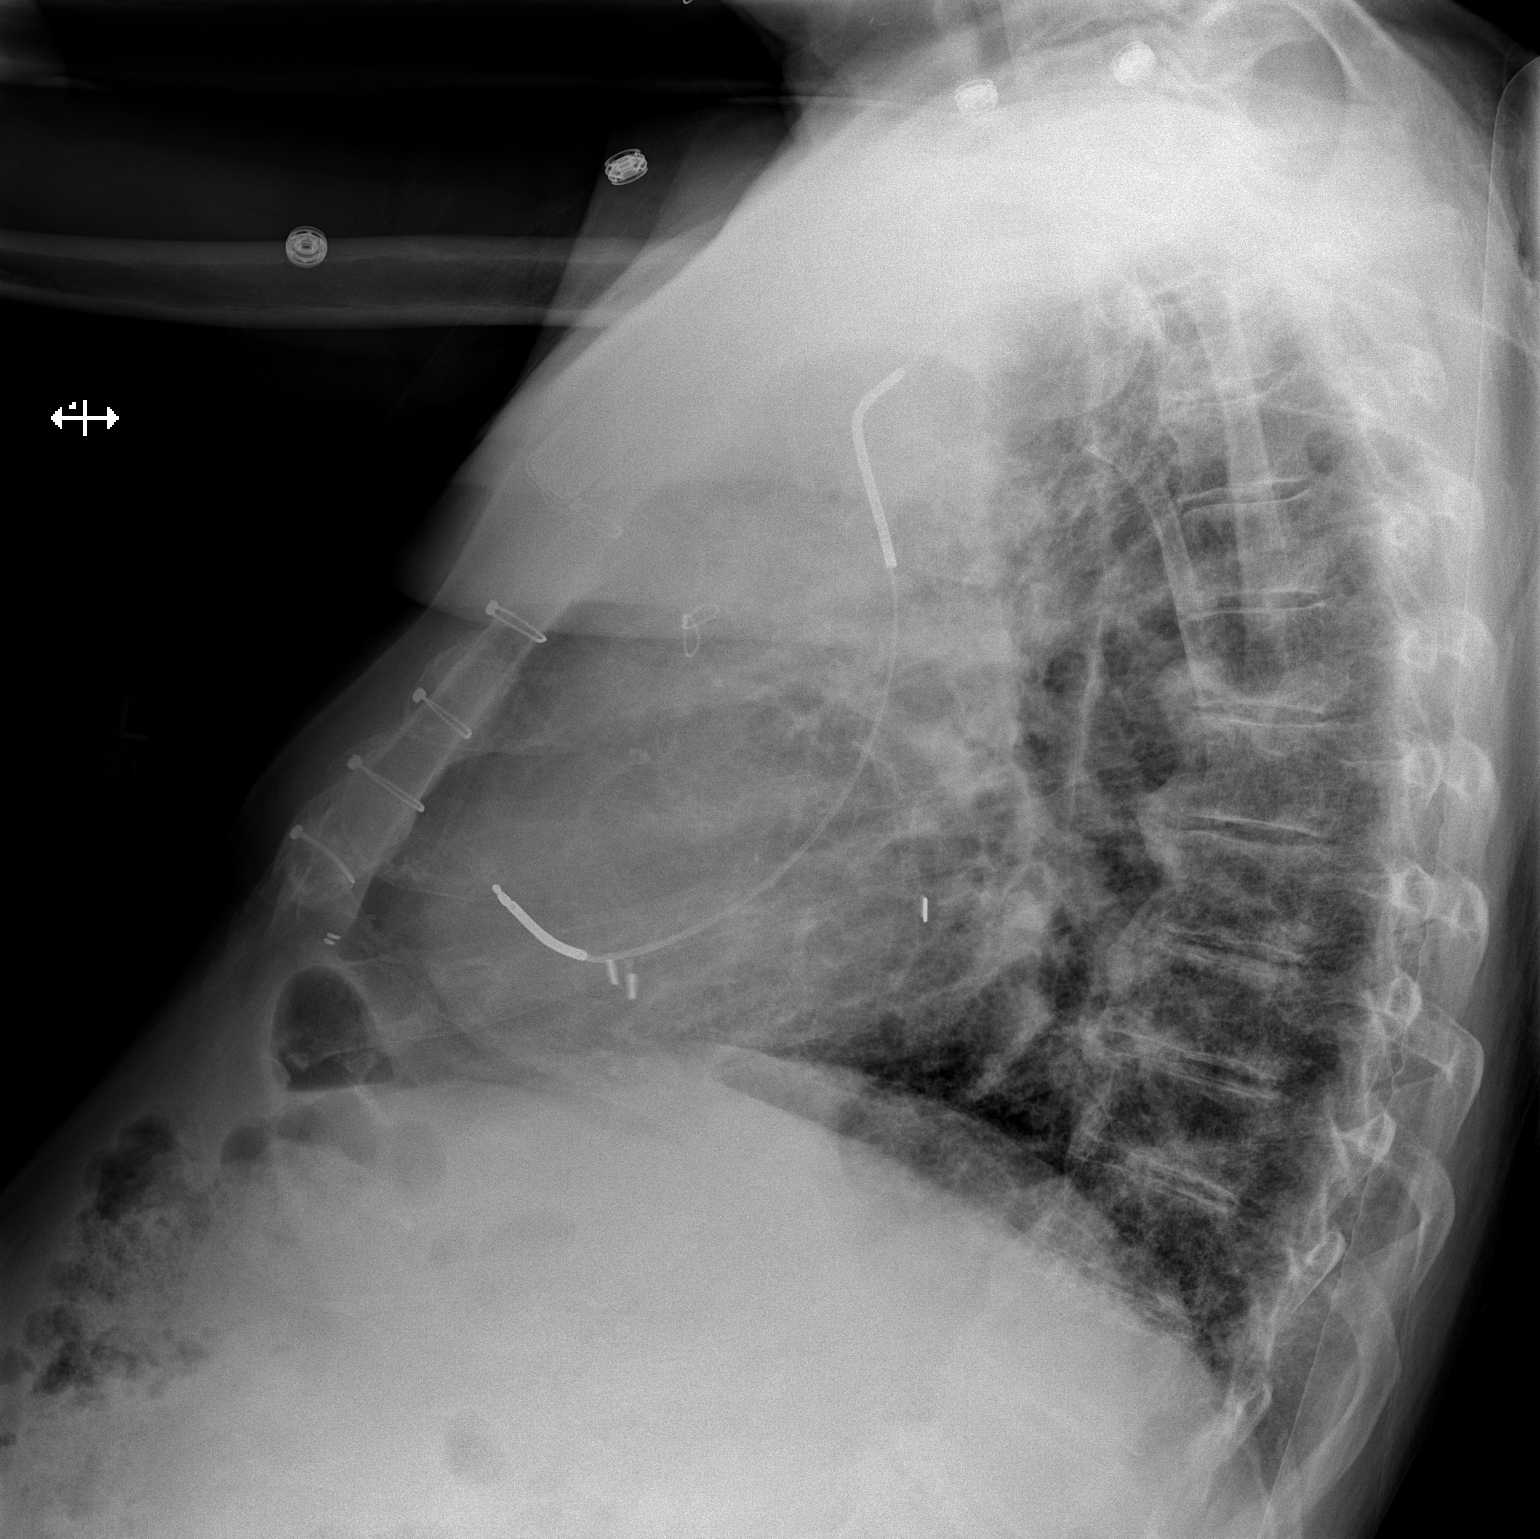

[x chest ap]
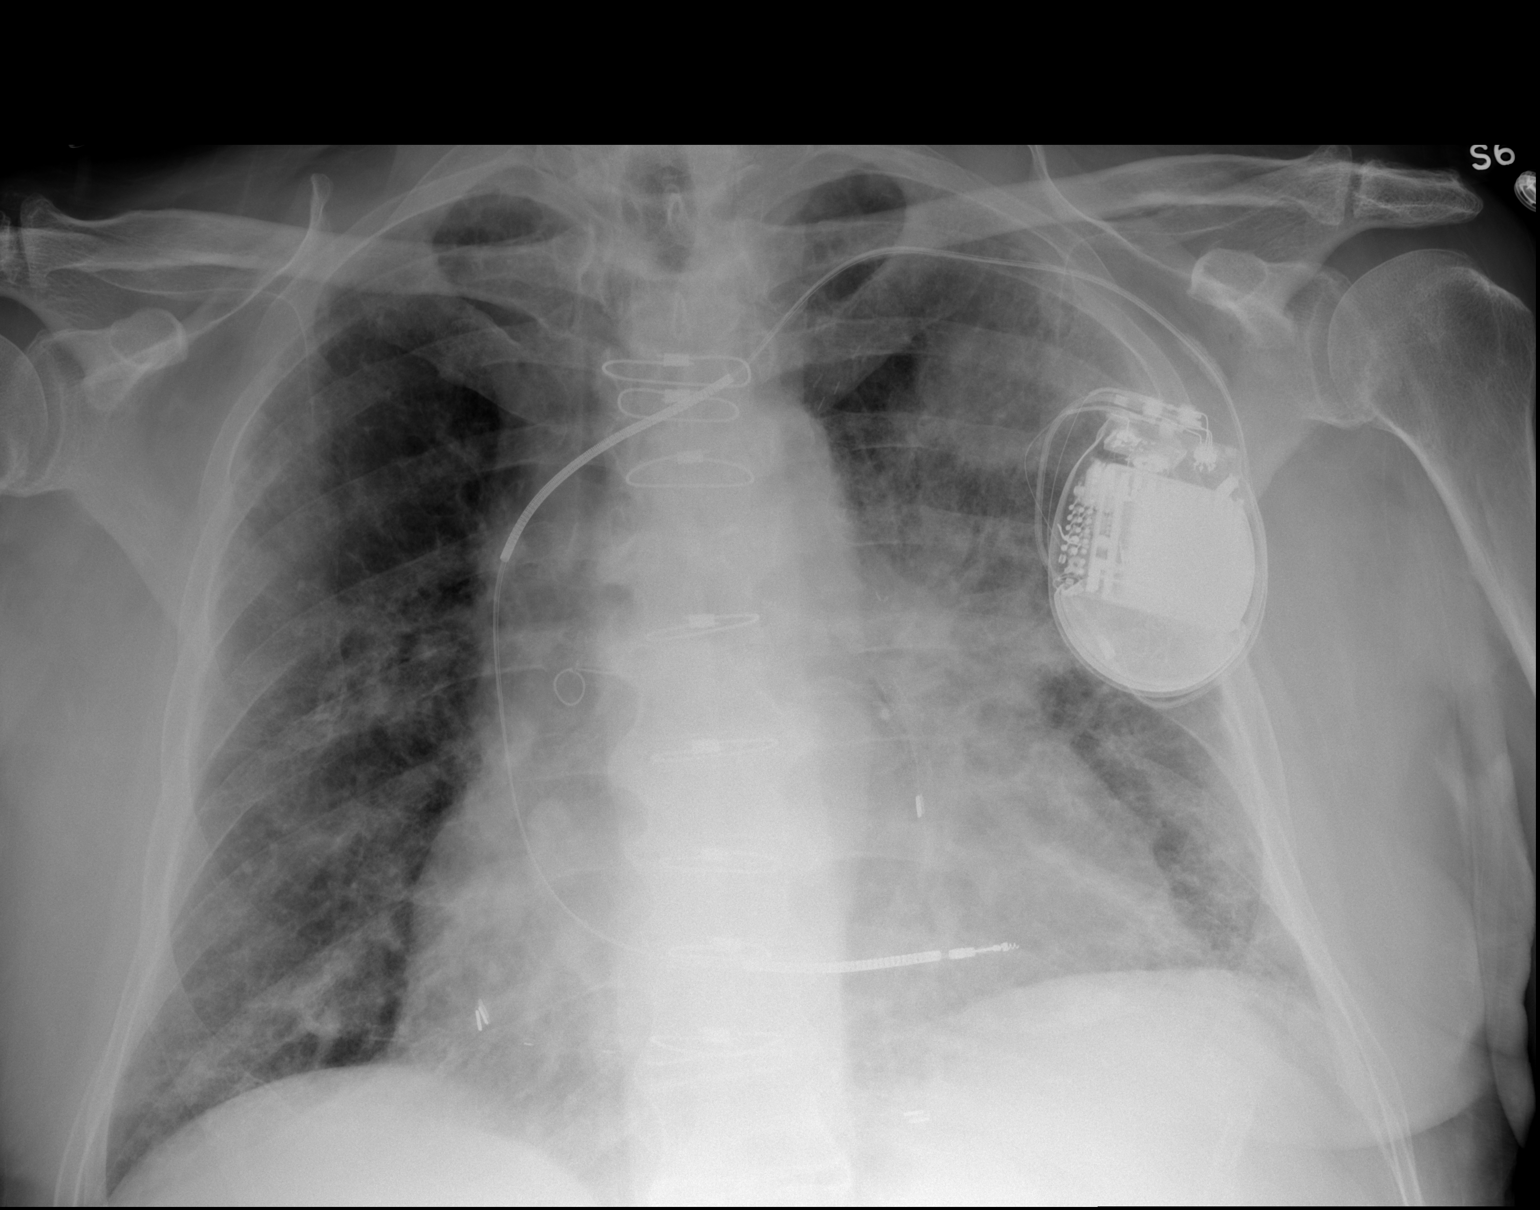

[2 of 2 positions shown; findings below may reference images not displayed]

FINDINGS: Prior sternotomy for CABG. Cardiac silhouette moderately enlarged,
unchanged. Left subclavian pacing defibrillator unchanged and
appears intact.

Large left upper lobe lung mass extending into the left hilum, with
improvement in aeration of the left upper lobe since the PET-CT.
Diffuse interstitial pulmonary fibrosis, unchanged. No new pulmonary
parenchymal abnormalities elsewhere in either lung. Degenerative
changes and DISH involving the thoracic spine.
IMPRESSION: 1. Large left upper lobe lung mass with improved aeration in the
left upper lobe since the PET-CT 09/28/2014, likely a combination of
improved post-obstructive atelectasis and tumor shrinkage after
chemotherapy.
2. Chronic Interstitial pulmonary fibrosis.
3. No new/acute abnormalities.
4. Stable cardiomegaly without evidence pulmonary edema.

## 2017-02-23 IMAGING — CR DG CHEST 2V
2 series · 2 of 2 positions shown · non-contrast
Comparison: 10/11/2014

CLINICAL DATA: Increasing cough for 1 week. Lung cancer under
treatment.

EXAM:
CHEST  2 VIEW

[w chest lat]
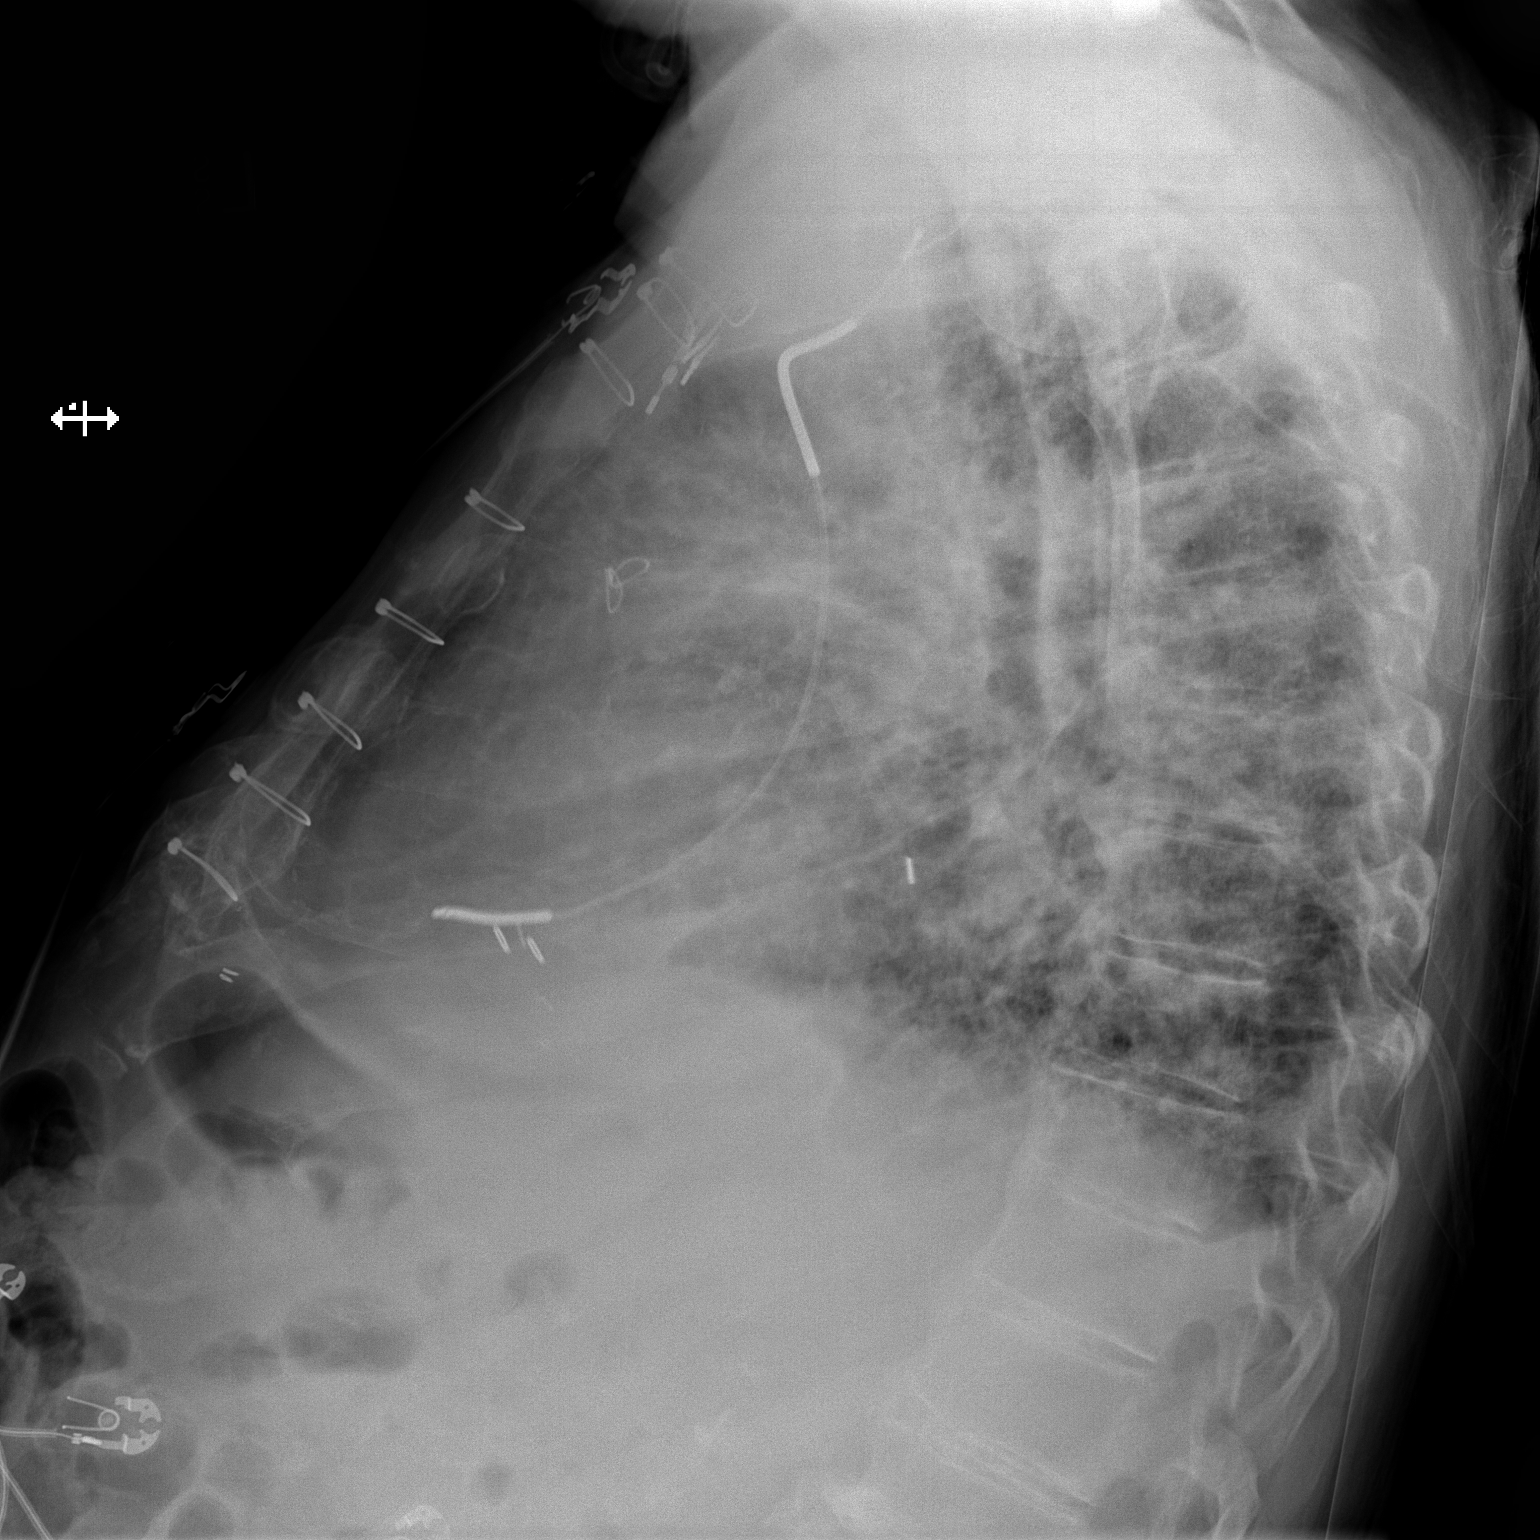

[x chest ap]
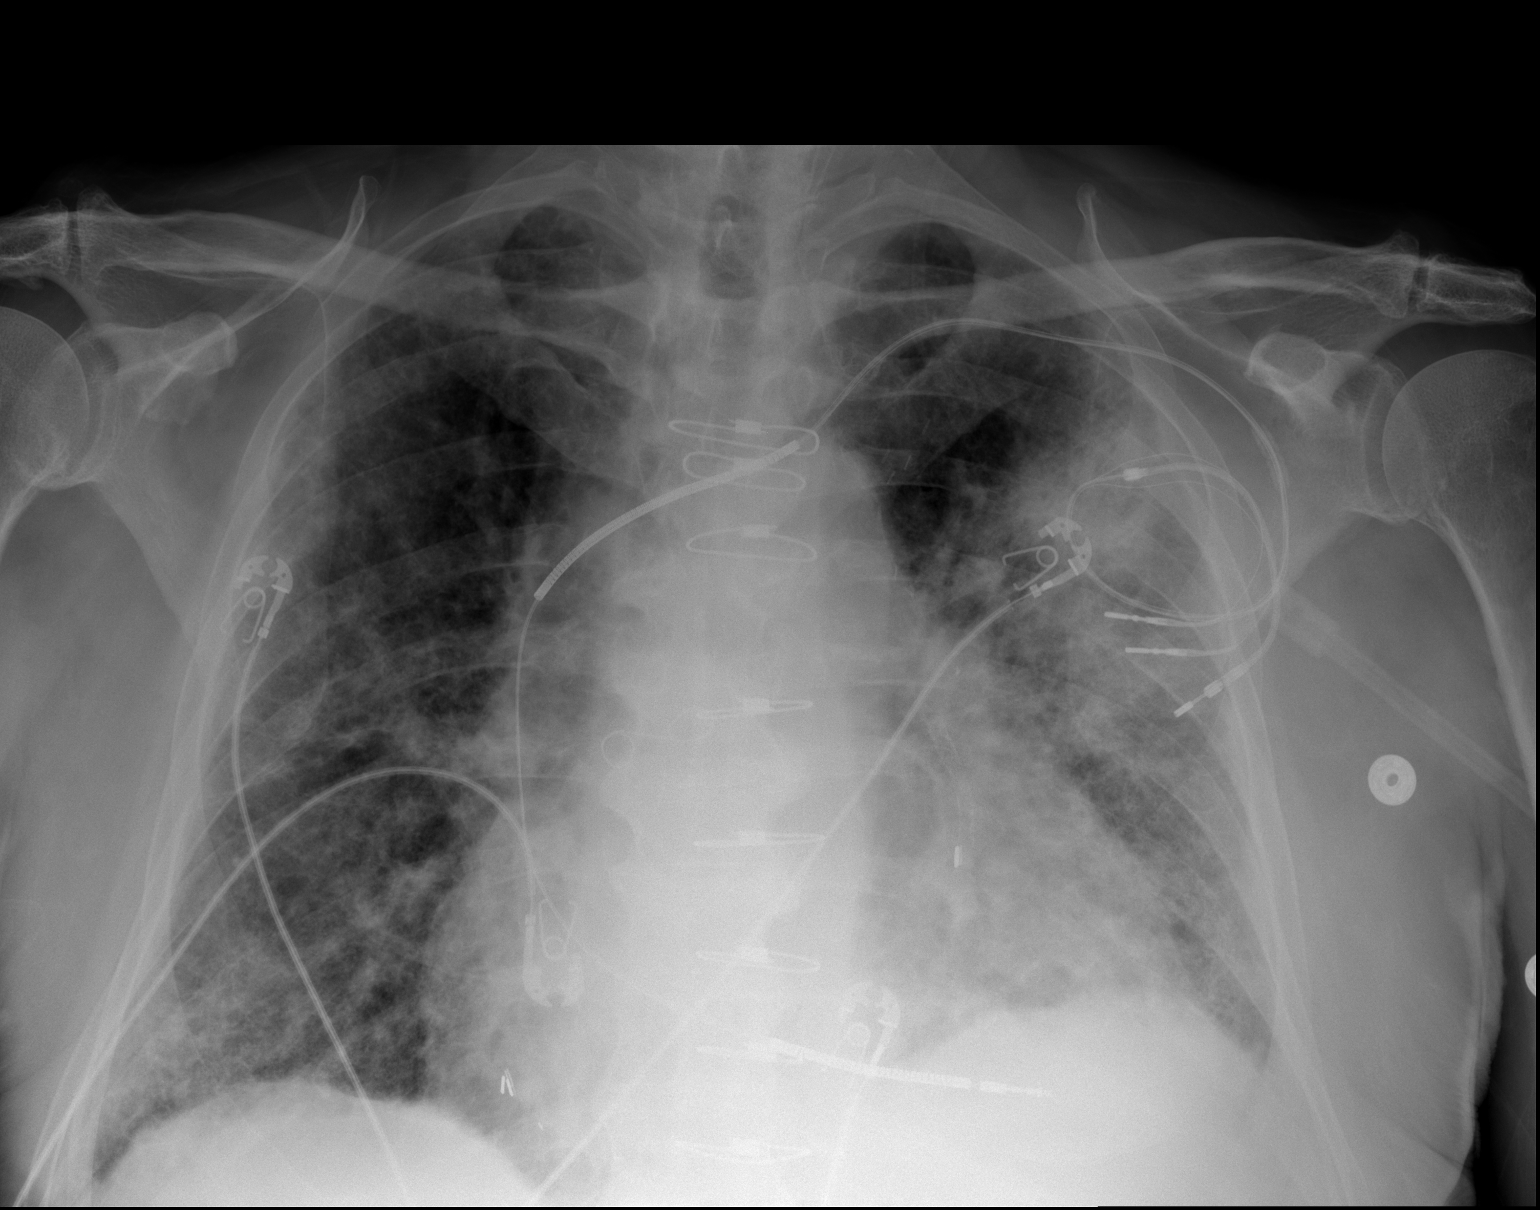

[2 of 2 positions shown; findings below may reference images not displayed]

FINDINGS: Generator pack has been removed since prior. Unchanged positioning
of single chamber ICD/ pacer lead into the right ventricle.

Unchanged cardiomegaly.  There is been CABG and coronary stenting.

Chronic interstitial coarsening which is diffusely increased from
prior. There is a focal opacity in the left upper lobe which
correlates with lung cancer and surrounding airspace disease.
IMPRESSION: 1. Progressive interstitial coarsening which could be congestive or
infectious.
2. Known left upper lobe malignancy.
